# Patient Record
Sex: Female | Born: 1960 | Race: White | Hispanic: No | Marital: Married | Smoking: Current every day smoker
Health system: Southern US, Community
[De-identification: ages and names within clinical notes are randomized; demographics above are authoritative.]

## PROBLEM LIST (undated history)

## (undated) DIAGNOSIS — H409 Unspecified glaucoma: Secondary | ICD-10-CM

## (undated) DIAGNOSIS — F329 Major depressive disorder, single episode, unspecified: Secondary | ICD-10-CM

## (undated) DIAGNOSIS — F32A Depression, unspecified: Secondary | ICD-10-CM

## (undated) DIAGNOSIS — G4730 Sleep apnea, unspecified: Secondary | ICD-10-CM

## (undated) DIAGNOSIS — I10 Essential (primary) hypertension: Secondary | ICD-10-CM

## (undated) DIAGNOSIS — J939 Pneumothorax, unspecified: Secondary | ICD-10-CM

## (undated) DIAGNOSIS — J45909 Unspecified asthma, uncomplicated: Secondary | ICD-10-CM

## (undated) DIAGNOSIS — Z862 Personal history of diseases of the blood and blood-forming organs and certain disorders involving the immune mechanism: Secondary | ICD-10-CM

## (undated) DIAGNOSIS — G4700 Insomnia, unspecified: Secondary | ICD-10-CM

## (undated) DIAGNOSIS — G629 Polyneuropathy, unspecified: Secondary | ICD-10-CM

## (undated) HISTORY — PX: KNEE SURGERY: SHX244

## (undated) HISTORY — DX: Essential (primary) hypertension: I10

## (undated) HISTORY — DX: Unspecified glaucoma: H40.9

## (undated) HISTORY — DX: Polyneuropathy, unspecified: G62.9

## (undated) HISTORY — DX: Sleep apnea, unspecified: G47.30

## (undated) HISTORY — PX: TONSILLECTOMY: SUR1361

## (undated) HISTORY — DX: Unspecified asthma, uncomplicated: J45.909

## (undated) HISTORY — DX: Pneumothorax, unspecified: J93.9

## (undated) HISTORY — DX: Insomnia, unspecified: G47.00

## (undated) HISTORY — PX: TUBAL LIGATION: SHX77

## (undated) HISTORY — DX: Depression, unspecified: F32.A

## (undated) HISTORY — DX: Major depressive disorder, single episode, unspecified: F32.9

## (undated) NOTE — *Deleted (*Deleted)
   02/14/20 1414  Vitals  Temp 98.2 F (36.8 C)  Temp Source Oral  BP (!) 93/52  MAP (mmHg) (!) 62  BP Location Right Arm  BP Method Automatic  Patient Position (if appropriate) Lying  Pulse Rate 79  Resp 18  MEWS COLOR  MEWS Score Color Green  Oxygen Therapy  SpO2 97 %  MEWS Score  MEWS Temp 0  MEWS Systolic 1  MEWS Pulse 0  MEWS RR 0  MEWS LOC 0  MEWS Score 1

---

## 2004-04-03 HISTORY — PX: OTHER SURGICAL HISTORY: SHX169

## 2005-04-03 HISTORY — PX: ABDOMINOPLASTY: SUR9

## 2009-01-08 ENCOUNTER — Ambulatory Visit: Payer: Self-pay | Admitting: Family Medicine

## 2009-01-08 ENCOUNTER — Encounter (INDEPENDENT_AMBULATORY_CARE_PROVIDER_SITE_OTHER): Payer: Self-pay | Admitting: *Deleted

## 2009-01-08 DIAGNOSIS — R32 Unspecified urinary incontinence: Secondary | ICD-10-CM

## 2009-01-08 DIAGNOSIS — F329 Major depressive disorder, single episode, unspecified: Secondary | ICD-10-CM | POA: Insufficient documentation

## 2009-01-08 DIAGNOSIS — F32A Depression, unspecified: Secondary | ICD-10-CM | POA: Insufficient documentation

## 2009-01-11 ENCOUNTER — Ambulatory Visit: Payer: Self-pay | Admitting: Family Medicine

## 2009-01-12 ENCOUNTER — Encounter: Payer: Self-pay | Admitting: Family Medicine

## 2009-01-12 LAB — CONVERTED CEMR LAB
ALT: 19 units/L (ref 0–35)
Albumin: 3.9 g/dL (ref 3.5–5.2)
Alkaline Phosphatase: 53 units/L (ref 39–117)
Basophils Relative: 0.2 % (ref 0.0–3.0)
Bilirubin, Direct: 0 mg/dL (ref 0.0–0.3)
CO2: 28 meq/L (ref 19–32)
Chloride: 101 meq/L (ref 96–112)
Creatinine, Ser: 0.7 mg/dL (ref 0.4–1.2)
Glucose, Bld: 94 mg/dL (ref 70–99)
HCT: 36.9 % (ref 36.0–46.0)
Hemoglobin: 12.3 g/dL (ref 12.0–15.0)
Hgb A1c MFr Bld: 5.6 % (ref 4.6–6.5)
Lymphocytes Relative: 22.2 % (ref 12.0–46.0)
Monocytes Relative: 6.6 % (ref 3.0–12.0)
Neutro Abs: 5.8 10*3/uL (ref 1.4–7.7)
RBC: 4.04 M/uL (ref 3.87–5.11)
TSH: 2.28 microintl units/mL (ref 0.35–5.50)
Total CHOL/HDL Ratio: 4
Total Protein: 6.5 g/dL (ref 6.0–8.3)
Triglycerides: 96 mg/dL (ref 0.0–149.0)

## 2009-05-04 ENCOUNTER — Ambulatory Visit: Payer: Self-pay | Admitting: Family Medicine

## 2009-05-18 ENCOUNTER — Telehealth: Payer: Self-pay | Admitting: Family Medicine

## 2009-05-19 ENCOUNTER — Telehealth (INDEPENDENT_AMBULATORY_CARE_PROVIDER_SITE_OTHER): Payer: Self-pay | Admitting: *Deleted

## 2009-07-06 ENCOUNTER — Telehealth: Payer: Self-pay | Admitting: Family Medicine

## 2009-07-07 ENCOUNTER — Ambulatory Visit: Payer: Self-pay | Admitting: Family Medicine

## 2009-07-08 ENCOUNTER — Encounter: Payer: Self-pay | Admitting: Family Medicine

## 2009-07-09 ENCOUNTER — Telehealth: Payer: Self-pay | Admitting: Family Medicine

## 2009-07-12 ENCOUNTER — Ambulatory Visit: Payer: Self-pay | Admitting: Family Medicine

## 2009-08-07 ENCOUNTER — Telehealth (INDEPENDENT_AMBULATORY_CARE_PROVIDER_SITE_OTHER): Payer: Self-pay | Admitting: *Deleted

## 2009-08-13 ENCOUNTER — Encounter (INDEPENDENT_AMBULATORY_CARE_PROVIDER_SITE_OTHER): Payer: Self-pay | Admitting: *Deleted

## 2009-09-01 ENCOUNTER — Ambulatory Visit: Payer: Self-pay | Admitting: Family Medicine

## 2009-09-02 LAB — CONVERTED CEMR LAB
Albumin: 4 g/dL (ref 3.5–5.2)
Alkaline Phosphatase: 58 units/L (ref 39–117)
Bilirubin, Direct: 0.1 mg/dL (ref 0.0–0.3)
Calcium: 8.5 mg/dL (ref 8.4–10.5)
GFR calc non Af Amer: 124.79 mL/min (ref >60)
HDL: 42.9 mg/dL (ref >39.00)
LDL Cholesterol: 84 mg/dL (ref 0–99)
Sodium: 140 meq/L (ref 135–145)
Total Bilirubin: 0.7 mg/dL (ref 0.3–1.2)
Total CHOL/HDL Ratio: 4
VLDL: 31.8 mg/dL (ref 0.0–40.0)

## 2009-09-14 ENCOUNTER — Telehealth: Payer: Self-pay | Admitting: Family Medicine

## 2009-10-20 ENCOUNTER — Encounter: Payer: Self-pay | Admitting: Family Medicine

## 2009-11-05 ENCOUNTER — Telehealth (INDEPENDENT_AMBULATORY_CARE_PROVIDER_SITE_OTHER): Payer: Self-pay | Admitting: *Deleted

## 2009-11-08 ENCOUNTER — Ambulatory Visit: Payer: Self-pay | Admitting: Family Medicine

## 2009-11-08 DIAGNOSIS — R1310 Dysphagia, unspecified: Secondary | ICD-10-CM | POA: Insufficient documentation

## 2009-11-08 DIAGNOSIS — G4700 Insomnia, unspecified: Secondary | ICD-10-CM | POA: Insufficient documentation

## 2009-11-09 ENCOUNTER — Encounter (INDEPENDENT_AMBULATORY_CARE_PROVIDER_SITE_OTHER): Payer: Self-pay | Admitting: *Deleted

## 2009-12-03 ENCOUNTER — Ambulatory Visit: Payer: Self-pay | Admitting: Gastroenterology

## 2009-12-03 DIAGNOSIS — Z87448 Personal history of other diseases of urinary system: Secondary | ICD-10-CM | POA: Insufficient documentation

## 2009-12-03 DIAGNOSIS — K769 Liver disease, unspecified: Secondary | ICD-10-CM

## 2009-12-09 ENCOUNTER — Ambulatory Visit (HOSPITAL_COMMUNITY)
Admission: RE | Admit: 2009-12-09 | Discharge: 2009-12-09 | Payer: Self-pay | Source: Home / Self Care | Admitting: Gastroenterology

## 2009-12-14 ENCOUNTER — Ambulatory Visit: Payer: Self-pay | Admitting: Family Medicine

## 2009-12-14 DIAGNOSIS — I10 Essential (primary) hypertension: Secondary | ICD-10-CM | POA: Insufficient documentation

## 2009-12-21 LAB — CONVERTED CEMR LAB
BUN: 13 mg/dL (ref 6–23)
Chloride: 102 meq/L (ref 96–112)
Glucose, Bld: 94 mg/dL (ref 70–99)
Potassium: 5.6 meq/L — ABNORMAL HIGH (ref 3.5–5.1)

## 2009-12-28 ENCOUNTER — Ambulatory Visit: Payer: Self-pay | Admitting: Family Medicine

## 2009-12-28 DIAGNOSIS — M2550 Pain in unspecified joint: Secondary | ICD-10-CM

## 2009-12-28 DIAGNOSIS — IMO0001 Reserved for inherently not codable concepts without codable children: Secondary | ICD-10-CM

## 2009-12-31 ENCOUNTER — Telehealth: Payer: Self-pay | Admitting: Family Medicine

## 2010-01-03 LAB — CONVERTED CEMR LAB
Albumin: 4.7 g/dL (ref 3.5–5.2)
Basophils Absolute: 0.1 10*3/uL (ref 0.0–0.1)
CO2: 31 meq/L (ref 19–32)
CRP, High Sensitivity: 6.14 — ABNORMAL HIGH (ref 0.00–5.00)
Calcium: 9.3 mg/dL (ref 8.4–10.5)
Eosinophils Absolute: 0.3 10*3/uL (ref 0.0–0.7)
Glucose, Bld: 126 mg/dL — ABNORMAL HIGH (ref 70–99)
Hemoglobin: 13.4 g/dL (ref 12.0–15.0)
Lymphocytes Relative: 26.7 % (ref 12.0–46.0)
Lymphs Abs: 2.6 10*3/uL (ref 0.7–4.0)
MCHC: 34.6 g/dL (ref 30.0–36.0)
Monocytes Absolute: 0.7 10*3/uL (ref 0.1–1.0)
Neutro Abs: 6.1 10*3/uL (ref 1.4–7.7)
RDW: 11.5 % (ref 11.5–14.6)
Rhuematoid fact SerPl-aCnc: 22 intl units/mL — ABNORMAL HIGH (ref 0–20)
Sed Rate: 24 mm/hr — ABNORMAL HIGH (ref 0–22)
Sodium: 139 meq/L (ref 135–145)
TSH: 2.41 microintl units/mL (ref 0.35–5.50)

## 2010-01-28 ENCOUNTER — Telehealth (INDEPENDENT_AMBULATORY_CARE_PROVIDER_SITE_OTHER): Payer: Self-pay | Admitting: *Deleted

## 2010-02-09 ENCOUNTER — Telehealth: Payer: Self-pay | Admitting: Family Medicine

## 2010-02-14 ENCOUNTER — Ambulatory Visit: Payer: Self-pay | Admitting: Family Medicine

## 2010-03-10 ENCOUNTER — Encounter: Payer: Self-pay | Admitting: Family Medicine

## 2010-03-10 ENCOUNTER — Ambulatory Visit: Payer: Self-pay | Admitting: Family Medicine

## 2010-03-10 DIAGNOSIS — N951 Menopausal and female climacteric states: Secondary | ICD-10-CM | POA: Insufficient documentation

## 2010-03-17 ENCOUNTER — Encounter: Payer: Self-pay | Admitting: Family Medicine

## 2010-03-25 ENCOUNTER — Ambulatory Visit: Payer: Self-pay | Admitting: Family Medicine

## 2010-03-25 ENCOUNTER — Encounter: Payer: Self-pay | Admitting: Family Medicine

## 2010-03-29 ENCOUNTER — Telehealth (INDEPENDENT_AMBULATORY_CARE_PROVIDER_SITE_OTHER): Payer: Self-pay | Admitting: *Deleted

## 2010-03-29 LAB — CONVERTED CEMR LAB
Creatinine,U: 150.5 mg/dL
GFR calc non Af Amer: 95.83 mL/min (ref >60.00)
Hgb A1c MFr Bld: 6.4 % (ref 4.6–6.5)
LDL Cholesterol: 46 mg/dL (ref 0–99)
Microalb Creat Ratio: 0.4 mg/g (ref 0.0–30.0)
Potassium: 4.4 meq/L (ref 3.5–5.1)
Sodium: 138 meq/L (ref 135–145)
VLDL: 18.6 mg/dL (ref 0.0–40.0)

## 2010-05-03 NOTE — Progress Notes (Signed)
Summary: Pristiq not a preferred med  Phone Note Refill Request Message from:  Pharmacy on [facility] [address] Fax #: [phone]  Refills Requested: Medication #1:  PRISTIQ 50 MG XR24H-TAB 1 by mouth once daily   Dosage confirmed as above?Dosage Confirmed   Supply Requested: 1 month   Last Refilled: 05/18/2009 Prior Auth [id number]  Initial call taken by: Harold Barban,  May 19, 2009 8:39 AM  Follow-up for Phone Call        Med is not on pt Preferred Med, per [facility] prefererred alternatives are Bupropion XL or bupropion SR, or Cymbalta--Please advise .Kandice Hams  May 19, 2009 12:08 PM  Follow-up by: Kandice Hams,  May 19, 2009 12:08 PM  Additional Follow-up for Phone Call Additional follow up Details #1::        let pt know and make sure she has never been on one of them before----- if no---give samples of cymbalta 30 mg for 1 week then cymbalta 60 mg for 1 week------   send 60 mg 1 by mouth once daily to pharmacy---f/u 4-6 weeks Additional Follow-up by: Loreen Freud DO,  May 19, 2009 1:32 PM    Additional Follow-up for Phone Call Additional follow up Details #2::    pt has never been on Cymbalta before, advised of Dr Laury Axon recommendations and samples up front and rx [facility] [address] Follow-up by: Kandice Hams,  May 19, 2009 4:05 PM  New/Updated Medications: CYMBALTA 60 MG CPEP (DULOXETINE HCL) 1 by mouth once daily Prescriptions: CYMBALTA 60 MG CPEP (DULOXETINE HCL) 1 by mouth once daily  #30 x 1   Entered by:   Kandice Hams   Authorized by:   Loreen Freud DO   Signed by:   Kandice Hams on 05/19/2009   Method used:   Faxed to ...       [facility]  [address] # [id number]* (retail)       [address]       [place], [place]  [address]       Ph: [phone] or [phone]       Fax: [phone]   RxID:   [id number]

## 2010-05-03 NOTE — Progress Notes (Signed)
Summary: Request for pain meds  Phone Note Call from Patient   Caller: Patient Call For: Loreen Freud DO Details for Reason: Pt wants pain meds Summary of Call: Call from Pt requesting an Rx of Tramadol be [facility]ed [facility] [place], says she will be back in town next week. Their number is  [phone] her cb# [phone], Per pt she is having neck pain again.... Please advise. Initial call taken by: Almeta Monas CMA Duncan Dull),  February 09, 2010 4:21 PM  Follow-up for Phone Call        refill tramadol x1 Follow-up by: Loreen Freud DO,  February 09, 2010 9:41 PM  Additional Follow-up for Phone Call Additional follow up Details #1::        spk with pt and she Now wants a rf on her flexeril also, says she doesn't know what wrong, adv she needs and appt, scheduled appt for Monday with Dr.Lowne at 2 pm. Pt would like the RX faxed to [facility] [phone] is their #. Please advise if it is ok to fill the flexeril. Additional Follow-up by: Almeta Monas CMA Duncan Dull),  February 10, 2010 8:48 AM    Additional Follow-up for Phone Call Additional follow up Details #2::    refill flexeril x1--but if pain gets worse she should not wait until monday---she should go to UC or ER Follow-up by: Loreen Freud DO,  February 10, 2010 10:09 AM  Additional Follow-up for Phone Call Additional follow up Details #3:: Details for Additional Follow-up Action Taken: Pt aware of the above... Rx faxed to pharm [phone].... Almeta Monas CMA Duncan Dull)  February 10, 2010 11:16 AM   Prescriptions: TRAMADOL HCL 50 MG TABS (TRAMADOL HCL) take one tablet every 6 hours as needed  #30 Tablet x 0   Entered by:   Almeta Monas CMA (AAMA)   Authorized by:   Loreen Freud DO   Signed by:   Almeta Monas CMA (AAMA) on 02/10/2010   Method used:   Printed then [facility]ed [facility] ...       [facility]             , [place]         Ph:        Fax:    RxID:   [id number] FLEXERIL 10 MG TABS (CYCLOBENZAPRINE  HCL) 1 by mouth three times a day as needed  #30 Tablet x 0   Entered by:   Almeta Monas CMA (AAMA)   Authorized by:   Loreen Freud DO   Signed by:   Almeta Monas CMA (AAMA) on 02/10/2010   Method used:   Printed then [facility]ed [facility] ...       [facility]             , [place]         Ph:        Fax:    RxID:   [id number]

## 2010-05-03 NOTE — Assessment & Plan Note (Signed)
Summary: back pain - neck pain/cbs   Vital Signs:  Patient profile:   50 year old female Height:      66 inches Weight:      219.2 pounds Temp:     98.1 degrees F oral Pulse rate:   84 / minute Pulse rhythm:   regular BP sitting:   142 / 94  (left arm)  Vitals Entered By: Almeta Monas CMA Duncan Dull) (December 14, 2009 11:30 AM) CC: c/o back pain that is radiating to the neck and head causing headaches x3wks   History of Present Illness: Pt here c/o neck pain and headaches for about 1 week.  Pt is going to chiropracter and getting relief.  Current Medications (verified): 1)  Metformin Hcl 500 Mg Tabs (Metformin Hcl) .Marland Kitchen.. 1 By Mouth Daily. 2)  Estradiol 0.5 Mg Tabs (Estradiol) .Marland Kitchen.. 1 By Mouth Qd 3)  Cymbalta 60 Mg Cpep (Duloxetine Hcl) .... Take 1 Tab Once Daily 4)  Alprazolam 0.25 Mg Tabs (Alprazolam) .Marland Kitchen.. 1 By Mouth Three Times A Day 5)  Epipen 0.3 Mg/0.81ml Devi (Epinephrine) .... As Directed. 6)  Vesicare 10 Mg Tabs (Solifenacin Succinate) .Marland Kitchen.. 1 By Mouth Once Daily 7)  Multivitamins   Tabs (Multiple Vitamin) .... One Tablet By Mouth Once Daily 8)  Tums 500 Mg Chew (Calcium Carbonate Antacid) .... Six Tablet By Mouth Once Daily 9)  Lisinopril-Hydrochlorothiazide 10-12.5 Mg Tabs (Lisinopril-Hydrochlorothiazide) .Marland Kitchen.. 1 By Mouth Once Daily 10)  Flexeril 10 Mg Tabs (Cyclobenzaprine Hcl) .Marland Kitchen.. 1 By Mouth Three Times A Day As Needed 11)  Tramadol Hcl 50 Mg Tabs (Tramadol Hcl) .... Take One Tablet Every 6 Hours As Needed  Allergies (verified): No Known Drug Allergies  Past History:  Past medical, surgical, family and social histories (including risk factors) reviewed for relevance to current acute and chronic problems.  Past Medical History: Reviewed history from 12/03/2009 and no changes required. COUGH (ICD-786.2) INSOMNIA (ICD-780.52) DYSPHAGIA UNSPECIFIED (ICD-787.20) ACUTE PHARYNGITIS (ICD-462) PREVENTIVE HEALTH CARE (ICD-V70.0) FAMILY HISTORY DIABETES 1ST DEGREE RELATIVE  (ICD-V18.0) URINARY INCONTINENCE (ICD-788.30) DIABETES MELLITUS, TYPE I (ICD-250.01) DEPRESSION (ICD-311)  High Blood Pressure Readings  Past Surgical History: Reviewed history from 12/03/2009 and no changes required. Tubal ligation Knee Surgery--Left  [place]-2007  Mini Gastric Bypass Surgery   Family History: Reviewed history from 12/03/2009 and no changes required. Family History of Arthritis Family History Diabetes 1st degree relative: Dad, and Paternal Dad Side Family History High cholesterol Family History Hypertension Stroke  Family History of Stroke M 1st degree relative <50 No FH of Colon Cancer: Family History of Heart Disease: Pateranl Side   Social History: Reviewed history from 12/03/2009 and no changes required. Retired Married No Childern Patient is a former smoker.  Alcohol Use - no Daily Caffeine Use: 3 daily  Illicit Drug Use - no  Review of Systems      See HPI  Physical Exam  General:  Well-developed,well-nourished,in no acute distress; alert,appropriate and cooperative throughout examination Ears:  External ear exam shows no significant lesions or deformities.  Otoscopic examination reveals clear canals, tympanic membranes are intact bilaterally without bulging, retraction, inflammation or discharge. Hearing is grossly normal bilaterally. Nose:  External nasal examination shows no deformity or inflammation. Nasal mucosa are pink and moist without lesions or exudates. Neck:  No deformities, masses, or tenderness noted. Lungs:  Normal respiratory effort, chest expands symmetrically. Lungs are clear to auscultation, no crackles or wheezes. Msk:  muscle spasm trap b/l  Extremities:  No clubbing, cyanosis, edema, or deformity noted with  normal full range of motion of all joints.   Neurologic:  No cranial nerve deficits noted. Station and gait are normal. Plantar reflexes are down-going bilaterally. DTRs are symmetrical throughout. Sensory, motor and  coordinative functions appear intact. Skin:  Intact without suspicious lesions or rashes Cervical Nodes:  No lymphadenopathy noted Psych:  Cognition and judgment appear intact. Alert and cooperative with normal attention span and concentration. No apparent delusions, illusions, hallucinations   Impression & Recommendations:  Problem # 1:  CERVICAL MUSCLE STRAIN (ICD-847.0)  Her updated medication list for this problem includes:    Flexeril 10 Mg Tabs (Cyclobenzaprine hcl) .Marland Kitchen... 1 by mouth three times a day as needed    Tramadol Hcl 50 Mg Tabs (Tramadol hcl) .Marland Kitchen... Take one tablet every 6 hours as needed  Orders: Venipuncture (54098) TLB-BMP (Basic Metabolic Panel-BMET) (80048-METABOL)  Problem # 2:  ESSENTIAL HYPERTENSION (ICD-401.9)  Her updated medication list for this problem includes:    Lisinopril-hydrochlorothiazide 10-12.5 Mg Tabs (Lisinopril-hydrochlorothiazide) .Marland Kitchen... 1 by mouth once daily  Orders: Venipuncture (11914) TLB-BMP (Basic Metabolic Panel-BMET) (80048-METABOL)  BP today: 142/94 Prior BP: 142/86 (12/03/2009)  Labs Reviewed: K+: 4.9 (09/01/2009) Creat: : 0.6 (09/01/2009)   Chol: 159 (09/01/2009)   HDL: 42.90 (09/01/2009)   LDL: 84 (09/01/2009)   TG: 159.0 (09/01/2009)  Complete Medication List: 1)  Metformin Hcl 500 Mg Tabs (Metformin hcl) .Marland Kitchen.. 1 by mouth daily. 2)  Estradiol 0.5 Mg Tabs (Estradiol) .Marland Kitchen.. 1 by mouth qd 3)  Cymbalta 60 Mg Cpep (Duloxetine hcl) .... Take 1 tab once daily 4)  Alprazolam 0.25 Mg Tabs (Alprazolam) .Marland Kitchen.. 1 by mouth three times a day 5)  Epipen 0.3 Mg/0.43ml Devi (Epinephrine) .... As directed. 6)  Vesicare 10 Mg Tabs (Solifenacin succinate) .Marland Kitchen.. 1 by mouth once daily 7)  Multivitamins Tabs (Multiple vitamin) .... One tablet by mouth once daily 8)  Tums 500 Mg Chew (Calcium carbonate antacid) .... Six tablet by mouth once daily 9)  Lisinopril-hydrochlorothiazide 10-12.5 Mg Tabs (Lisinopril-hydrochlorothiazide) .Marland Kitchen.. 1 by mouth once  daily 10)  Flexeril 10 Mg Tabs (Cyclobenzaprine hcl) .Marland Kitchen.. 1 by mouth three times a day as needed 11)  Tramadol Hcl 50 Mg Tabs (Tramadol hcl) .... Take one tablet every 6 hours as needed Prescriptions: TRAMADOL HCL 50 MG TABS (TRAMADOL HCL) take one tablet every 6 hours as needed  #30 x 0   Entered by:   Doristine Devoid CMA   Authorized by:   Loreen Freud DO   Signed by:   Doristine Devoid CMA on 12/14/2009   Method used:   Electronically to        [facility] # [id number]* (retail)       [address]       [place], [place]  [address]       Ph: [phone] or [phone]       Fax: [phone]   RxID:   [id number] FLEXERIL 10 MG TABS (CYCLOBENZAPRINE HCL) 1 by mouth three times a day as needed  #30 x 0   Entered and Authorized by:   Loreen Freud DO   Signed by:   Loreen Freud DO on 12/14/2009   Method used:   Electronically to        [facility] # [id number]* (retail)       [address]       [place], [place]  [address]  Ph: [phone] or [phone]       Fax: [phone]   RxID:   [id number] LISINOPRIL-HYDROCHLOROTHIAZIDE 10-12.5 MG TABS (LISINOPRIL-HYDROCHLOROTHIAZIDE) 1 by mouth once daily  #30 x 2   Entered and Authorized by:   Loreen Freud DO   Signed by:   Loreen Freud DO on 12/14/2009   Method used:   Electronically to        [facility] # [id number]* (retail)       [address]       [place], [place]  [address]       Ph: [phone] or [phone]       Fax: [phone]   RxID:   [id number]

## 2010-05-03 NOTE — Progress Notes (Signed)
Summary: refill  Phone Note Refill Request Call back at Work Phone  Call back at [phone] Message from:  Patient  Refills Requested: Medication #1:  METFORMIN HCL 500 MG TABS 1 by mouth daily. patient wants refill at new pharmacy - [facility] [address] [phone]  Initial call taken by: Okey Regal [place],  January 28, 2010 9:30 AM    Prescriptions: METFORMIN HCL 500 MG TABS (METFORMIN HCL) 1 by mouth daily.  #30 Tablet x 2   Entered by:   Almeta Monas CMA (AAMA)   Authorized by:   Loreen Freud DO   Signed by:   Almeta Monas CMA (AAMA) on 01/28/2010   Method used:   Printed then faxed to ...       [facility]  [address] # [id number]* (retail)       [address]       [place], [place]  [address]       Ph: [phone] or [phone]       Fax: [phone]   RxID:   [id number]  Rx printed and faxed to [facility] in [place] (New Location) Fax # [phone].Almeta Monas CMA Duncan Dull)  January 28, 2010 10:49 AM

## 2010-05-03 NOTE — Letter (Signed)
Summary: Primary Care Consult Scheduled Letter  [facility] [place]  [address] [place], [place] [address]   Phone: [phone]  Fax: [phone]      11/09/2009 MRN: [id number]  [facility] [address] [place], [place]  [address]    Dear Ms. Amber Mata,    We have scheduled an appointment for you.  At the recommendation of Dr. Loreen Freud, we have scheduled you a consult with Dr. Sheryn Bison of [facility] on 12-03-2009 at 9:30am.  Their address is [address], [address]rd floor, [place] [address]. The office phone number is [phone].  If this appointment day and time is not convenient for you, please feel free to call the office of the doctor you are being referred to at the number listed above and reschedule the appointment.    It is important for you to keep your scheduled appointments. We are here to make sure you are given good patient care.   Thank you,    Renee, Patient Care Coordinator [facility]

## 2010-05-03 NOTE — Progress Notes (Signed)
Summary: Cholesterol Med  Phone Note Call from Patient   Caller: Patient Summary of Call: Patient would like to notify the doctor that she has chosen not to take her high cholestorol medicine. Initial call taken by: Harold Barban,  September 14, 2009 2:58 PM

## 2010-05-03 NOTE — Assessment & Plan Note (Signed)
Summary: sore throat/kdc   Vital Signs:  Patient profile:   50 year old female Height:      66 inches Weight:      214.25 pounds BMI:     34.71 Temp:     98.4 degrees F oral Pulse rate:   74 / minute BP sitting:   120 / 80  Vitals Entered By: Kandice Hams (July 07, 2009 11:16 AM) CC: c/o sore throat can hardly swallow, allittle cough, URI symptoms   History of Present Illness:       This is a 50 year old woman who presents with URI symptoms.  The symptoms began 2 days ago.  The patient complains of sore throat and dry cough, but denies nasal congestion, clear nasal discharge, purulent nasal discharge, productive cough, earache, and sick contacts.  The patient denies fever, low-grade fever (<100.5 degrees), fever of 100.5-103 degrees, fever of 103.1-104 degrees, fever to >104 degrees, stiff neck, dyspnea, wheezing, rash, vomiting, diarrhea, use of an antipyretic, and response to antipyretic.  The patient denies itchy watery eyes, itchy throat, sneezing, seasonal symptoms, response to antihistamine, headache, muscle aches, and severe fatigue.  The patient denies the following risk factors for Strep sinusitis: unilateral facial pain, unilateral nasal discharge, poor response to decongestant, double sickening, tooth pain, Strep exposure, tender adenopathy, and absence of cough.    Current Medications (verified): 1)  Metformin Hcl 500 Mg Tabs (Metformin Hcl) .Marland Kitchen.. 1 By Mouth Daily. 2)  Estradiol 0.5 Mg Tabs (Estradiol) .Marland Kitchen.. 1 By Mouth Qd 3)  Cymbalta 60 Mg Cpep (Duloxetine Hcl) .Marland Kitchen.. 1 By Mouth Once Daily 4)  Alprazolam 0.25 Mg Tabs (Alprazolam) .Marland Kitchen.. 1 By Mouth Three Times A Day 5)  Epipen 0.3 Mg/0.22ml Devi (Epinephrine) .... As Directed. 6)  Vesicare 10 Mg Tabs (Solifenacin Succinate) .Marland Kitchen.. 1 By Mouth Once Daily 7)  Prednisone 10 Mg Tabs (Prednisone) .... 3 By Mouth Once Daily For 3 Days Then 2 By Mouth Once Daily For 3 Days Then 1 By Mouth Once Daily For 3 Days  Allergies (verified): No  Known Drug Allergies  Past History:  Past medical, surgical, family and social histories (including risk factors) reviewed for relevance to current acute and chronic problems.  Past Medical History: Reviewed history from 01/08/2009 and no changes required. Depression Diabetes mellitus, type I Urinary incontinence High Blood Pressure Readings UTI   Past Surgical History: Reviewed history from 01/08/2009 and no changes required. Tubal ligation Knee Surgery [place]-2007   Family History: Reviewed history from 01/08/2009 and no changes required. Family History of Arthritis Family History Diabetes 1st degree relative Family History High cholesterol Family History Hypertension Stroke  Family History of Stroke M 1st degree relative <50  Social History: Reviewed history from 01/08/2009 and no changes required. Married Never Smoked Alcohol use-yes  Review of Systems      See HPI  Physical Exam  General:  Well-developed,well-nourished,in no acute distress; alert,appropriate and cooperative throughout examination Mouth:  uvula swollen and red with white patch pharyngeal erythema.  pharyngeal erythema.   Neck:  No deformities, masses, or tenderness noted. Lungs:  Normal respiratory effort, chest expands symmetrically. Lungs are clear to auscultation, no crackles or wheezes. Heart:  normal rate and no murmur.  normal rate and no murmur.   Skin:  Intact without suspicious lesions or rashes Cervical Nodes:  No lymphadenopathy noted Psych:  Cognition and judgment appear intact. Alert and cooperative with normal attention span and concentration. No apparent delusions, illusions, hallucinations   Impression &  Recommendations:  Problem # 1:  ACUTE PHARYNGITIS (ICD-462) prednisone taper rto if no better Orders: T-Culture, Throat ([address]-60454) Admin of Therapeutic Inj  intramuscular or subcutaneous (09811) Bicillin LA 1.2 million units Injection (B1478) Depo- Medrol 80mg   (J1040) Admin of Therapeutic Inj  intramuscular or subcutaneous (29562) Rapid Strep (13086)  Instructed to complete antibiotics and call if not improved in 48 hours.   Complete Medication List: 1)  Metformin Hcl 500 Mg Tabs (Metformin hcl) .Marland Kitchen.. 1 by mouth daily. 2)  Estradiol 0.5 Mg Tabs (Estradiol) .Marland Kitchen.. 1 by mouth qd 3)  Cymbalta 60 Mg Cpep (Duloxetine hcl) .Marland Kitchen.. 1 by mouth once daily 4)  Alprazolam 0.25 Mg Tabs (Alprazolam) .Marland Kitchen.. 1 by mouth three times a day 5)  Epipen 0.3 Mg/0.31ml Devi (Epinephrine) .... As directed. 6)  Vesicare 10 Mg Tabs (Solifenacin succinate) .Marland Kitchen.. 1 by mouth once daily 7)  Prednisone 10 Mg Tabs (Prednisone) .... 3 by mouth once daily for 3 days then 2 by mouth once daily for 3 days then 1 by mouth once daily for 3 days Prescriptions: PREDNISONE 10 MG TABS (PREDNISONE) 3 by mouth once daily for 3 days then 2 by mouth once daily for 3 days then 1 by mouth once daily for 3 days  #18 x 0   Entered and Authorized by:   Loreen Freud DO   Signed by:   Loreen Freud DO on 07/07/2009   Method used:   Electronically to        [facility] # [id number]* (retail)       [address]       [place], [place]  [address]       Ph: [phone] or [phone]       Fax: [phone]   RxID:   [id number]    Medication Administration  Injection # 1:    Medication: Bicillin LA 1.2 million units Injection    Diagnosis: ACUTE PHARYNGITIS (ICD-462)    Route: IM    Site: LUOQ gluteus    Exp Date: 05/05/2011    Lot #: [id number]    Mfr: [employer]    Patient tolerated injection without complications    Given by: Kandice Hams (July 07, 2009 12:07 PM)  Injection # 2:    Medication: Depo- Medrol 80mg     Diagnosis: ACUTE PHARYNGITIS (ICD-462)    Route: IM    Site: RUOQ gluteus    Exp Date: 02/11/2010    Lot #: [id number]    Mfr: upjohn    Patient tolerated injection without complications    Given by: Kandice Hams (July 07, 2009 12:11  PM)  Orders Added: 1)  T-Culture, Throat [01093-23557] 2)  Admin of Therapeutic Inj  intramuscular or subcutaneous [96372] 3)  Bicillin LA 1.2 million units Injection [J0561] 4)  Depo- Medrol 80mg  [J1040] 5)  Admin of Therapeutic Inj  intramuscular or subcutaneous [96372] 6)  Est. Patient Level III [32202] 7)  Rapid Strep [54270]

## 2010-05-03 NOTE — Assessment & Plan Note (Signed)
Summary: neck pain//kp   Vital Signs:  Patient profile:   50 year old female Weight:      221.0 pounds Pulse rate:   80 / minute Pulse rhythm:   regular BP sitting:   140 / 80  (left arm) Cuff size:   large  Vitals Entered By: Almeta Monas CMA Duncan Dull) (February 14, 2010 1:52 PM) CC: c/o neck pain-- not getting better after seeing chiropractor   History of Present Illness: Pt here still c/o L sided neck stiffness and pain.  She has been seeing chiropractor and taking ultram and flexeril with no relief.    Current Medications (verified): 1)  Metformin Hcl 500 Mg Tabs (Metformin Hcl) .Marland Kitchen.. 1 By Mouth Daily. 2)  Estradiol 0.5 Mg Tabs (Estradiol) .Marland Kitchen.. 1 By Mouth Qd 3)  Cymbalta 60 Mg Cpep (Duloxetine Hcl) .... 2 By Mouth Once Daily 4)  Alprazolam 0.25 Mg Tabs (Alprazolam) .Marland Kitchen.. 1 By Mouth Three Times A Day 5)  Epipen 0.3 Mg/0.63ml Devi (Epinephrine) .... As Directed. 6)  Multivitamins   Tabs (Multiple Vitamin) .... One Tablet By Mouth Once Daily 7)  Tums 500 Mg Chew (Calcium Carbonate Antacid) .... Six Tablet By Mouth Once Daily 8)  Lisinopril-Hydrochlorothiazide 20-25 Mg Tabs (Lisinopril-Hydrochlorothiazide) .Marland Kitchen.. 1 By Mouth Once Daily 9)  Soma 350 Mg Tabs (Carisoprodol) .Marland Kitchen.. 1 By Mouth Qid 10)  Tramadol Hcl 50 Mg Tabs (Tramadol Hcl) .Marland Kitchen.. 1-2 By Mouth Every 6 Hours As Needed 11)  Lipitor 20 Mg Tabs (Atorvastatin Calcium) .Marland Kitchen.. 1 By Mouth Once Daily 12)  Prednisone 10 Mg Tabs (Prednisone) .... 3 By Mouth Once Daily For 3 Days Then 2 By Mouth Once Daily For 3 Days Then 1 By Mouth Once Daily For 3 Days  Allergies (verified): No Known Drug Allergies  Past History:  Past Medical History: Last updated: 12/03/2009 COUGH (ICD-786.2) INSOMNIA (ICD-780.52) DYSPHAGIA UNSPECIFIED (ICD-787.20) ACUTE PHARYNGITIS (ICD-462) PREVENTIVE HEALTH CARE (ICD-V70.0) FAMILY HISTORY DIABETES 1ST DEGREE RELATIVE (ICD-V18.0) URINARY INCONTINENCE (ICD-788.30) DIABETES MELLITUS, TYPE I  (ICD-250.01) DEPRESSION (ICD-311)  High Blood Pressure Readings  Past Surgical History: Last updated: 12/03/2009 Tubal ligation Knee Surgery--Left  [place]-2007  Mini Gastric Bypass Surgery   Family History: Last updated: 12/03/2009 Family History of Arthritis Family History Diabetes 1st degree relative: Dad, and Paternal Dad Side Family History High cholesterol Family History Hypertension Stroke  Family History of Stroke M 1st degree relative <50 No FH of Colon Cancer: Family History of Heart Disease: Pateranl Side   Social History: Last updated: 12/03/2009 Retired Married No Childern Patient is a former smoker.  Alcohol Use - no Daily Caffeine Use: 3 daily  Illicit Drug Use - no  Risk Factors: Alcohol Use: <1 (01/08/2009)  Risk Factors: Smoking Status: quit (12/03/2009)  Family History: Reviewed history from 12/03/2009 and no changes required. Family History of Arthritis Family History Diabetes 1st degree relative: Dad, and Paternal Dad Side Family History High cholesterol Family History Hypertension Stroke  Family History of Stroke M 1st degree relative <50 No FH of Colon Cancer: Family History of Heart Disease: Pateranl Side   Social History: Reviewed history from 12/03/2009 and no changes required. Retired Married No Childern Patient is a former smoker.  Alcohol Use - no Daily Caffeine Use: 3 daily  Illicit Drug Use - no  Review of Systems      See HPI  Physical Exam  General:  Well-developed,well-nourished,in no acute distress; alert,appropriate and cooperative throughout examination Msk:  + muscle spasms L shoulder/ trap + decrease rom R shoulder Cervical  Nodes:  No lymphadenopathy noted Psych:  Cognition and judgment appear intact. Alert and cooperative with normal attention span and concentration. No apparent delusions, illusions, hallucinations   Impression & Recommendations:  Problem # 1:  CERVICAL MUSCLE STRAIN  (ICD-847.0) prednisone taper Her updated medication list for this problem includes:    Soma 350 Mg Tabs (Carisoprodol) .Marland Kitchen... 1 by mouth qid    Tramadol Hcl 50 Mg Tabs (Tramadol hcl) .Marland Kitchen... 1-2 by mouth every 6 hours as needed  Discussed exercises and use of moist heat or cold and medication.   Complete Medication List: 1)  Metformin Hcl 500 Mg Tabs (Metformin hcl) .Marland Kitchen.. 1 by mouth daily. 2)  Estradiol 0.5 Mg Tabs (Estradiol) .Marland Kitchen.. 1 by mouth qd 3)  Cymbalta 60 Mg Cpep (Duloxetine hcl) .... 2 by mouth once daily 4)  Alprazolam 0.25 Mg Tabs (Alprazolam) .Marland Kitchen.. 1 by mouth three times a day 5)  Epipen 0.3 Mg/0.77ml Devi (Epinephrine) .... As directed. 6)  Multivitamins Tabs (Multiple vitamin) .... One tablet by mouth once daily 7)  Tums 500 Mg Chew (Calcium carbonate antacid) .... Six tablet by mouth once daily 8)  Lisinopril-hydrochlorothiazide 20-25 Mg Tabs (Lisinopril-hydrochlorothiazide) .Marland Kitchen.. 1 by mouth once daily 9)  Soma 350 Mg Tabs (Carisoprodol) .Marland Kitchen.. 1 by mouth qid 10)  Tramadol Hcl 50 Mg Tabs (Tramadol hcl) .Marland Kitchen.. 1-2 by mouth every 6 hours as needed 11)  Lipitor 20 Mg Tabs (Atorvastatin calcium) .Marland Kitchen.. 1 by mouth once daily 12)  Prednisone 10 Mg Tabs (Prednisone) .... 3 by mouth once daily for 3 days then 2 by mouth once daily for 3 days then 1 by mouth once daily for 3 days Prescriptions: TRAMADOL HCL 50 MG TABS (TRAMADOL HCL) 1-2 by mouth every 6 hours as needed  #60 x 0   Entered and Authorized by:   Loreen Freud DO   Signed by:   Loreen Freud DO on 02/14/2010   Method used:   Electronically to        [id number] (retail)       [address]       [place], [place]  [address]       Ph: [phone] or [phone]       Fax: [phone]   RxID:   [id number] SOMA 350 MG TABS (CARISOPRODOL) 1 by mouth qid  #60 x 0   Entered and Authorized by:   Loreen Freud DO   Signed by:   Loreen Freud DO on 02/14/2010   Method used:   Electronically to         [id number] (retail)       [address]       [place], [place]  [address]       Ph: [phone] or [phone]       Fax: [phone]   RxID:   [id number] PREDNISONE 10 MG TABS (PREDNISONE) 3 by mouth once daily for 3 days then 2 by mouth once daily for 3 days then 1 by mouth once daily for 3 days  #18 x 0   Entered and Authorized by:   Loreen Freud DO   Signed by:   Loreen Freud DO on 02/14/2010   Method used:   Electronically to        [id number] (retail)       [address]  [place], [place]  [address]       Ph: [phone] or [phone]       Fax: [phone]   RxID:   [id number]    Orders Added: 1)  Est. Patient Level III [27[address]]

## 2010-05-03 NOTE — Assessment & Plan Note (Signed)
Summary: DYSPHAGIA/YF   History of Present Illness Visit Type: consult  Primary GI MD: Sheryn Bison MD FACP FAGA Primary Provider: Loreen Freud, DO  Requesting Provider: Loreen Freud, DO  Chief Complaint: Dysphagia with solids History of Present Illness:   [name] with 2 years of intermittent dysphagia for solids and liquids her upper pharyngeal area. She fell at ENT exam 2 years ago in [facility] which was normal. She denies chronic reflux symptoms or history of meat impactions. She has not had previous endoscopies or barium studies. His been no anorexia weight loss, other upper GI, hepatobiliary, or lower GI problems. She has no other neuromuscular problems but does have chronic anxiety and depression.   GI Review of Systems    Reports dysphagia with solids.      Denies abdominal pain, acid reflux, belching, bloating, chest pain, dysphagia with liquids, heartburn, loss of appetite, nausea, vomiting, vomiting blood, weight loss, and  weight gain.        Denies anal fissure, black tarry stools, change in bowel habit, constipation, diarrhea, diverticulosis, fecal incontinence, heme positive stool, hemorrhoids, irritable bowel syndrome, jaundice, light color stool, liver problems, rectal bleeding, and  rectal pain.    Current Medications (verified): 1)  Metformin Hcl 500 Mg Tabs (Metformin Hcl) .Marland Kitchen.. 1 By Mouth Daily. 2)  Estradiol 0.5 Mg Tabs (Estradiol) .Marland Kitchen.. 1 By Mouth Qd 3)  Cymbalta 60 Mg Cpep (Duloxetine Hcl) .... Take 1 Tab Once Daily 4)  Alprazolam 0.25 Mg Tabs (Alprazolam) .Marland Kitchen.. 1 By Mouth Three Times A Day 5)  Epipen 0.3 Mg/0.66ml Devi (Epinephrine) .... As Directed. 6)  Vesicare 10 Mg Tabs (Solifenacin Succinate) .Marland Kitchen.. 1 By Mouth Once Daily 7)  Multivitamins   Tabs (Multiple Vitamin) .... One Tablet By Mouth Once Daily 8)  Tums 500 Mg Chew (Calcium Carbonate Antacid) .... Six Tablet By Mouth Once Daily  Allergies (verified): No Known Drug  Allergies  Past History:  Past medical, surgical, family and social histories (including risk factors) reviewed for relevance to current acute and chronic problems.  Past Medical History: COUGH (ICD-786.2) INSOMNIA (ICD-780.52) DYSPHAGIA UNSPECIFIED (ICD-787.20) ACUTE PHARYNGITIS (ICD-462) PREVENTIVE HEALTH CARE (ICD-V70.0) FAMILY HISTORY DIABETES 1ST DEGREE RELATIVE (ICD-V18.0) URINARY INCONTINENCE (ICD-788.30) DIABETES MELLITUS, TYPE I (ICD-250.01) DEPRESSION (ICD-311)  High Blood Pressure Readings  Past Surgical History: Tubal ligation Knee Surgery--Left  [place]-2007  Mini Gastric Bypass Surgery   Family History: Reviewed history from 01/08/2009 and no changes required. Family History of Arthritis Family History Diabetes 1st degree relative: Dad, and Paternal Dad Side Family History High cholesterol Family History Hypertension Stroke  Family History of Stroke M 1st degree relative <50 No FH of Colon Cancer: Family History of Heart Disease: Pateranl Side   Social History: Reviewed history from 01/08/2009 and no changes required. Retired Married No Childern Patient is a former smoker.  Alcohol Use - no Daily Caffeine Use: 3 daily  Illicit Drug Use - no Smoking Status:  quit Drug Use:  no  Review of Systems       The patient complains of anxiety-new, cough, muscle pains/cramps, night sweats, skin rash, sleeping problems, sore throat, and voice change.  The patient denies allergy/sinus, anemia, arthritis/joint pain, back pain, blood in urine, breast changes/lumps, change in vision, confusion, coughing up blood, depression-new, fainting, fatigue, fever, headaches-new, hearing problems, heart murmur, heart rhythm changes, itching, menstrual pain, nosebleeds, pregnancy symptoms, shortness of breath, swelling of feet/legs, swollen lymph glands, thirst - excessive , urination - excessive , urination changes/pain, urine leakage,  and vision changes.    Vital  Signs:  Patient profile:   50 year old female Height:      66 inches Weight:      220 pounds BMI:     35.64 BSA:     2.08 Pulse rate:   80 / minute Pulse rhythm:   regular BP sitting:   142 / 86  (left arm) Cuff size:   regular  Vitals Entered By: Ok Anis CMA (December 03, 2009 9:40 AM)  Physical Exam  General:  Well developed, well nourished, no acute distress.healthy appearing.   Head:  Normocephalic and atraumatic. Eyes:  PERRLA, no icterus. Mouth:  No deformity or lesions, dentition normal. Neck:  Supple; no masses or thyromegaly. Lungs:  Clear throughout to auscultation. Heart:  Regular rate and rhythm; no murmurs, rubs,  or bruits. Abdomen:  Mild hepatomegaly right upper quadrant with a prominent left hepatic lobe. No other abdominal masses or tenderness noted. Pulses:  Normal pulses noted. Extremities:  No clubbing, cyanosis, edema or deformities noted. Neurologic:  Alert and  oriented x4;  grossly normal neurologically. Cervical Nodes:  No significant cervical adenopathy. Psych:  Alert and cooperative. Normal mood and affect.   Impression & Recommendations:  Problem # 1:  DYSPHAGIA UNSPECIFIED (ICD-787.20) Assessment Unchanged Rule Out an esophageal motility disorder, neck spondylosis with compression, versus occult stricture the esophagus or esophageal web. Barium swallow has been scheduled, and she probably will need endoscopy exam.  Problem # 2:  COUGH (ICD-786.2) Assessment: Unchanged Possibly an exercise to a manifestation of chronic GERD.  Problem # 3:  FATTY LIVER DISEASE (ICD-571.8) Assessment: Unchanged Mild hepatomegaly with a history of fatty liver and normal liver function tests. Patient does use minute metformin depending on her blood sugar levels.  Patient Instructions: 1)  Copy sent to : Dr. Loreen Freud 2)  Please continue current medications.  3)  Outpatient barium swallow and possible endoscopy.  Appended Document:  DYSPHAGIA/YF    Clinical Lists Changes  Orders: Added new Test order of Barium Swallow with Tablet (BS w/tab) - Signed

## 2010-05-03 NOTE — Letter (Signed)
Summary: Primary Care Appointment Letter  [facility]  [address] [place], [place] [address]   Phone: [phone]  Fax: [phone]    08/13/2009 MRN: [id number]  [facility] [address] [place], [place]  [address]  Dear Ms. Elesa Massed,   Your Primary Care Physician Loreen Freud DO has indicated that:    _______it is time to schedule an appointment.    _______you missed your appointment on______ and need to call and          reschedule.    ____X___you need to have lab work done (a1c, lipid, bmp, lft 250.00, 272.4).    _______you need to schedule an appointment discuss lab or test results.    _______you need to call to reschedule your appointment that is                       scheduled on _________.     Please call our office as soon as possible. Our phone number is [phone]. Please press option 1. Our office is open 8a-5p, Monday through Friday.     Thank you,    [facility]

## 2010-05-03 NOTE — Assessment & Plan Note (Signed)
Summary: cpx/cbs   Vital Signs:  Patient profile:   50 year old female Height:      65.5 inches Weight:      208.2 pounds Temp:     98.1 degrees F oral Pulse rate:   76 / minute Pulse rhythm:   regular BP sitting:   120 / 74  (left arm) Cuff size:   large  Vitals Entered By: Almeta Monas CMA Duncan Dull) (March 10, 2010 10:14 AM) CC: CPX/not fasting- no pap needed-- still having pain   History of Present Illness: Pt here for cpe.  No pap--pt needs gyn.    Type 1 diabetes mellitus follow-up      This is a 50 year old woman who presents with Type 2 diabetes mellitus follow-up.  The patient denies polyuria, polydipsia, blurred vision, self managed hypoglycemia, hypoglycemia requiring help, weight loss, weight gain, and numbness of extremities.  The patient denies the following symptoms: neuropathic pain, chest pain, vomiting, orthostatic symptoms, poor wound healing, intermittent claudication, vision loss, and foot ulcer.  Since the last visit the patient reports good dietary compliance, compliance with medications, exercising regularly, and not monitoring blood glucose.  Since the last visit, the patient reports having had eye care by an ophthalmologist.    Hyperlipidemia follow-up      The patient also presents for Hyperlipidemia follow-up.  The patient denies muscle aches, GI upset, abdominal pain, flushing, itching, constipation, diarrhea, and fatigue.  The patient denies the following symptoms: chest pain/pressure, exercise intolerance, dypsnea, palpitations, syncope, and pedal edema.  Compliance with medications (by patient report) has been near 100%.  Dietary compliance has been good.  The patient reports exercising 3-4X per week.  Adjunctive measures currently used by the patient include ASA.    Hypertension follow-up      The patient also presents for Hypertension follow-up.  The patient denies lightheadedness, urinary frequency, headaches, edema, impotence, rash, and fatigue.  The  patient denies the following associated symptoms: chest pain, chest pressure, exercise intolerance, dyspnea, palpitations, syncope, leg edema, and pedal edema.  Compliance with medications (by patient report) has been near 100%.  The patient reports that dietary compliance has been good.  The patient reports exercising 3-4X per week.  Adjunctive measures currently used by the patient include salt restriction.    Preventive Screening-Counseling & Management  Alcohol-Tobacco     Alcohol drinks/day: <1     Smoking Status: quit     Year Started: 1990     Year Quit: 1993  Caffeine-Diet-Exercise     Caffeine use/day: 0     Does Patient Exercise: yes     Type of exercise: treadmill,  walk     Exercise (avg: min/session): 30-60     Times/week: 4  Hep-HIV-STD-Contraception     Dental Visit-last 6 months yes     Dental Care Counseling: not indicated; dental care within six months     SBE monthly: yes     SBE Education/Counseling: not indicated; SBE done regularly      Sexual History:  currently monogamous.    Problems Prior to Update: 1)  Menopausal Syndrome  (ICD-627.2) 2)  Pain in Joint, Multiple Sites  (ICD-719.49) 3)  Myalgia  (ICD-729.1) 4)  Cervical Muscle Strain  (ICD-847.0) 5)  Essential Hypertension  (ICD-401.9) 6)  Fatty Liver Disease  (ICD-571.8) 7)  Uti's, Hx of  (ICD-V13.00) 8)  Cough  (ICD-786.2) 9)  Insomnia  (ICD-780.52) 10)  Dysphagia Unspecified  (ICD-787.20) 11)  Acute Pharyngitis  (ICD-462)  12)  Preventive Health Care  (ICD-V70.0) 13)  Family History Diabetes 1st Degree Relative  (ICD-V18.0) 14)  Urinary Incontinence  (ICD-788.30) 15)  Diabetes Mellitus, Type I  (ICD-250.01) 16)  Depression  (ICD-311)  Medications Prior to Update: 1)  Metformin Hcl 500 Mg Tabs (Metformin Hcl) .Marland Kitchen.. 1 By Mouth Daily. 2)  Estradiol 0.5 Mg Tabs (Estradiol) .Marland Kitchen.. 1 By Mouth Qd 3)  Cymbalta 60 Mg Cpep (Duloxetine Hcl) .... 2 By Mouth Once Daily 4)  Alprazolam 0.25 Mg Tabs  (Alprazolam) .Marland Kitchen.. 1 By Mouth Three Times A Day 5)  Epipen 0.3 Mg/0.16ml Devi (Epinephrine) .... As Directed. 6)  Multivitamins   Tabs (Multiple Vitamin) .... One Tablet By Mouth Once Daily 7)  Tums 500 Mg Chew (Calcium Carbonate Antacid) .... Six Tablet By Mouth Once Daily 8)  Lisinopril-Hydrochlorothiazide 20-25 Mg Tabs (Lisinopril-Hydrochlorothiazide) .Marland Kitchen.. 1 By Mouth Once Daily 9)  Soma 350 Mg Tabs (Carisoprodol) .Marland Kitchen.. 1 By Mouth Qid 10)  Tramadol Hcl 50 Mg Tabs (Tramadol Hcl) .Marland Kitchen.. 1-2 By Mouth Every 6 Hours As Needed 11)  Lipitor 20 Mg Tabs (Atorvastatin Calcium) .Marland Kitchen.. 1 By Mouth Once Daily  Current Medications (verified): 1)  Metformin Hcl 500 Mg Tabs (Metformin Hcl) .Marland Kitchen.. 1 By Mouth Daily. 2)  Estradiol 0.5 Mg Tabs (Estradiol) .Marland Kitchen.. 1 By Mouth Qd 3)  Cymbalta 60 Mg Cpep (Duloxetine Hcl) .... 2 By Mouth Once Daily 4)  Alprazolam 0.25 Mg Tabs (Alprazolam) .Marland Kitchen.. 1 By Mouth Three Times A Day 5)  Epipen 0.3 Mg/0.73ml Devi (Epinephrine) .... As Directed. 6)  Multivitamins   Tabs (Multiple Vitamin) .... One Tablet By Mouth Once Daily 7)  Tums 500 Mg Chew (Calcium Carbonate Antacid) .... Six Tablet By Mouth Once Daily 8)  Lisinopril-Hydrochlorothiazide 20-25 Mg Tabs (Lisinopril-Hydrochlorothiazide) .Marland Kitchen.. 1 By Mouth Once Daily 9)  Tramadol Hcl 50 Mg Tabs (Tramadol Hcl) .Marland Kitchen.. 1-2 By Mouth Every 6 Hours As Needed 10)  Lipitor 20 Mg Tabs (Atorvastatin Calcium) .Marland Kitchen.. 1 By Mouth Once Daily  Allergies (verified): No Known Drug Allergies  Past History:  Past Medical History: Last updated: 12/03/2009 COUGH (ICD-786.2) INSOMNIA (ICD-780.52) DYSPHAGIA UNSPECIFIED (ICD-787.20) ACUTE PHARYNGITIS (ICD-462) PREVENTIVE HEALTH CARE (ICD-V70.0) FAMILY HISTORY DIABETES 1ST DEGREE RELATIVE (ICD-V18.0) URINARY INCONTINENCE (ICD-788.30) DIABETES MELLITUS, TYPE I (ICD-250.01) DEPRESSION (ICD-311)  High Blood Pressure Readings  Past Surgical History: Last updated: 12/03/2009 Tubal ligation Knee Surgery--Left    [place]-2007  Mini Gastric Bypass Surgery   Family History: Last updated: 12/03/2009 Family History of Arthritis Family History Diabetes 1st degree relative: Dad, and Paternal Dad Side Family History High cholesterol Family History Hypertension Stroke  Family History of Stroke M 1st degree relative <50 No FH of Colon Cancer: Family History of Heart Disease: Pateranl Side   Social History: Last updated: 12/03/2009 Retired Married No Childern Patient is a former smoker.  Alcohol Use - no Daily Caffeine Use: 3 daily  Illicit Drug Use - no  Risk Factors: Alcohol Use: <1 (03/10/2010) Caffeine Use: 0 (03/10/2010) Exercise: yes (03/10/2010)  Risk Factors: Smoking Status: quit (03/10/2010)  Family History: Reviewed history from 12/03/2009 and no changes required. Family History of Arthritis Family History Diabetes 1st degree relative: Dad, and Paternal Dad Side Family History High cholesterol Family History Hypertension Stroke  Family History of Stroke M 1st degree relative <50 No FH of Colon Cancer: Family History of Heart Disease: Pateranl Side   Social History: Reviewed history from 12/03/2009 and no changes required. Retired Married No Childern Patient is a former smoker.  Alcohol Use - no  Daily Caffeine Use: 3 daily  Illicit Drug Use - no Does Patient Exercise:  yes Caffeine use/day:  0 Sexual History:  currently monogamous  Review of Systems      See HPI General:  Denies chills, fatigue, fever, loss of appetite, malaise, sleep disorder, sweats, weakness, and weight loss. Eyes:  Denies blurring, discharge, double vision, eye irritation, eye pain, halos, itching, light sensitivity, red eye, vision loss-1 eye, and vision loss-both eyes; optho-- q1y. ENT:  Denies decreased hearing, difficulty swallowing, ear discharge, earache, hoarseness, nasal congestion, nosebleeds, postnasal drainage, ringing in ears, sinus pressure, and sore throat. CV:  Denies bluish  discoloration of lips or nails, chest pain or discomfort, difficulty breathing at night, difficulty breathing while lying down, fainting, fatigue, leg cramps with exertion, lightheadness, near fainting, palpitations, shortness of breath with exertion, swelling of feet, swelling of hands, and weight gain. Resp:  Denies chest discomfort, chest pain with inspiration, cough, coughing up blood, excessive snoring, hypersomnolence, morning headaches, pleuritic, shortness of breath, sputum productive, and wheezing. GI:  Denies abdominal pain, bloody stools, change in bowel habits, constipation, dark tarry stools, diarrhea, excessive appetite, gas, hemorrhoids, indigestion, loss of appetite, nausea, vomiting, vomiting blood, and yellowish skin color. GU:  Denies abnormal vaginal bleeding, decreased libido, discharge, dysuria, genital sores, hematuria, incontinence, nocturia, urinary frequency, and urinary hesitancy. MS:  Complains of joint pain; denies joint redness, joint swelling, loss of strength, low back pain, mid back pain, muscle aches, muscle , cramps, muscle weakness, stiffness, and thoracic pain. Derm:  Denies changes in color of skin, changes in nail beds, dryness, excessive perspiration, flushing, hair loss, insect bite(s), itching, lesion(s), poor wound healing, and rash. Neuro:  Denies brief paralysis, difficulty with concentration, disturbances in coordination, falling down, headaches, inability to speak, memory loss, numbness, poor balance, seizures, sensation of room spinning, tingling, tremors, visual disturbances, and weakness. Psych:  Complains of irritability; denies alternate hallucination ( auditory/visual), anxiety, depression, easily angered, easily tearful, mental problems, panic attacks, sense of great danger, suicidal thoughts/plans, thoughts of violence, unusual visions or sounds, and thoughts /plans of harming others. Endo:  Denies cold intolerance, excessive hunger, excessive thirst,  excessive urination, heat intolerance, polyuria, and weight change. Heme:  Denies abnormal bruising, bleeding, enlarge lymph nodes, fevers, pallor, and skin discoloration. Allergy:  Denies hives or rash, itching eyes, persistent infections, seasonal allergies, and sneezing.  Physical Exam  General:  Well-developed,well-nourished,in no acute distress; alert,appropriate and cooperative throughout examination Head:  Normocephalic and atraumatic without obvious abnormalities. No apparent alopecia or balding. Eyes:  pupils equal, pupils round, pupils reactive to light, and no injection.   Ears:  External ear exam shows no significant lesions or deformities.  Otoscopic examination reveals clear canals, tympanic membranes are intact bilaterally without bulging, retraction, inflammation or discharge. Hearing is grossly normal bilaterally. Nose:  External nasal examination shows no deformity or inflammation. Nasal mucosa are pink and moist without lesions or exudates. Mouth:  Oral mucosa and oropharynx without lesions or exudates.  Teeth in good repair. Neck:  No deformities, masses, or tenderness noted. Breasts:  gyn Lungs:  Normal respiratory effort, chest expands symmetrically. Lungs are clear to auscultation, no crackles or wheezes. Heart:  normal rate and no murmur.   Abdomen:  Bowel sounds positive,abdomen soft and non-tender without masses, organomegaly or hernias noted. Rectal:  gyn Genitalia:  gyn Msk:  normal ROM, no joint tenderness, no joint swelling, no joint warmth, no redness over joints, no joint deformities, and no joint instability.   Pulses:  R and L carotid,radial,femoral,dorsalis pedis and posterior tibial pulses are full and equal bilaterally Extremities:  No clubbing, cyanosis, edema, or deformity noted with normal full range of motion of all joints.   Neurologic:  No cranial nerve deficits noted. Station and gait are normal. Plantar reflexes are down-going bilaterally. DTRs are  symmetrical throughout. Sensory, motor and coordinative functions appear intact. Skin:  Intact without suspicious lesions or rashes Cervical Nodes:  No lymphadenopathy noted Axillary Nodes:  No palpable lymphadenopathy Psych:  Cognition and judgment appear intact. Alert and cooperative with normal attention span and concentration. No apparent delusions, illusions, hallucinations  Diabetes Management Exam:    Foot Exam (with socks and/or shoes not present):       Sensory-Pinprick/Light touch:          Left medial foot (L-4): normal          Left dorsal foot (L-5): normal          Left lateral foot (S-1): normal          Right medial foot (L-4): normal          Right dorsal foot (L-5): normal          Right lateral foot (S-1): normal       Sensory-Monofilament:          Left foot: normal          Right foot: normal       Inspection:          Left foot: normal          Right foot: normal       Nails:          Left foot: normal          Right foot: normal    Eye Exam:       Eye Exam done elsewhere          Date: 03/10/2010          Results: normal          Done by: optho   Impression & Recommendations:  Problem # 1:  PREVENTIVE HEALTH CARE (ICD-V70.0) fasting labs ghm utd  Problem # 2:  MENOPAUSAL SYNDROME (ICD-627.2)  Her updated medication list for this problem includes:    Estradiol 0.5 Mg Tabs (Estradiol) .Marland Kitchen... 1 by mouth qd  Orders: Gynecologic Referral (Gyn) EKG w/ Interpretation (93000)  Problem # 3:  ESSENTIAL HYPERTENSION (ICD-401.9)  Her updated medication list for this problem includes:    Lisinopril-hydrochlorothiazide 20-25 Mg Tabs (Lisinopril-hydrochlorothiazide) .Marland Kitchen... 1 by mouth once daily  BP today: 120/74 Prior BP: 140/80 (02/14/2010)  Labs Reviewed: K+: 5.1 (12/28/2009) Creat: : 0.8 (12/28/2009)   Chol: 159 (09/01/2009)   HDL: 42.90 (09/01/2009)   LDL: 84 (09/01/2009)   TG: 159.0 (09/01/2009)  Orders: EKG w/ Interpretation (93000)  Problem #  4:  DIABETES MELLITUS, TYPE I (ICD-250.01)  Her updated medication list for this problem includes:    Metformin Hcl 500 Mg Tabs (Metformin hcl) .Marland Kitchen... 1 by mouth daily.    Lisinopril-hydrochlorothiazide 20-25 Mg Tabs (Lisinopril-hydrochlorothiazide) .Marland Kitchen... 1 by mouth once daily  Labs Reviewed: Creat: 0.8 (12/28/2009)     Last Eye Exam: normal (03/10/2010) Reviewed HgBA1c results: 5.6 (09/01/2009)  5.6 (01/11/2009)  Orders: EKG w/ Interpretation (93000)  Complete Medication List: 1)  Metformin Hcl 500 Mg Tabs (Metformin hcl) .Marland Kitchen.. 1 by mouth daily. 2)  Estradiol 0.5 Mg Tabs (Estradiol) .Marland Kitchen.. 1 by mouth qd 3)  Cymbalta 60 Mg Cpep (Duloxetine  hcl) .... 2 by mouth once daily 4)  Alprazolam 0.25 Mg Tabs (Alprazolam) .Marland Kitchen.. 1 by mouth three times a day 5)  Epipen 0.3 Mg/0.63ml Devi (Epinephrine) .... As directed. 6)  Multivitamins Tabs (Multiple vitamin) .... One tablet by mouth once daily 7)  Tums 500 Mg Chew (Calcium carbonate antacid) .... Six tablet by mouth once daily 8)  Lisinopril-hydrochlorothiazide 20-25 Mg Tabs (Lisinopril-hydrochlorothiazide) .Marland Kitchen.. 1 by mouth once daily 9)  Tramadol Hcl 50 Mg Tabs (Tramadol hcl) .Marland Kitchen.. 1-2 by mouth every 6 hours as needed 10)  Lipitor 20 Mg Tabs (Atorvastatin calcium) .Marland Kitchen.. 1 by mouth once daily  Other Orders: Tdap => 47yrs IM (13086) Admin 1st Vaccine (57846)  Patient Instructions: 1)  V70.0  250.00, 272.4  lipid, bmp, hep, hgba1c, microalbumin, 729.1  vita D Prescriptions: ALPRAZOLAM 0.25 MG TABS (ALPRAZOLAM) 1 by mouth three times a day  #90 x 0   Entered and Authorized by:   Loreen Freud DO   Signed by:   Loreen Freud DO on 03/10/2010   Method used:   Print then Give to Patient   RxID:   [id number] LIPITOR 20 MG TABS (ATORVASTATIN CALCIUM) 1 by mouth once daily  #30 x 5   Entered and Authorized by:   Loreen Freud DO   Signed by:   Loreen Freud DO on 03/10/2010   Method used:   Electronically to        [id number]  (retail)       [address]       [place], [place]  [address]       Ph: [phone] or [phone]       Fax: [phone]   RxID:   [id number] TRAMADOL HCL 50 MG TABS (TRAMADOL HCL) 1-2 by mouth every 6 hours as needed  #60 x 0   Entered and Authorized by:   Loreen Freud DO   Signed by:   Loreen Freud DO on 03/10/2010   Method used:   Electronically to        [id number]* (retail)       [address]       [place], [place]  [address]       Ph: [phone] or [phone]       Fax: [phone]   RxID:   [id number] LISINOPRIL-HYDROCHLOROTHIAZIDE 20-25 MG TABS (LISINOPRIL-HYDROCHLOROTHIAZIDE) 1 by mouth once daily  #30 Tablet x 5   Entered and Authorized by:   Loreen Freud DO   Signed by:   Loreen Freud DO on 03/10/2010   Method used:   Electronically to        [id number] (retail)       [address]       [place], [place]  [address]       Ph: [phone] or [phone]       Fax: [phone]   RxID:   [id number] CYMBALTA 60 MG CPEP (DULOXETINE HCL) 2 by mouth once daily  #60 x 5   Entered and Authorized by:   Loreen Freud DO   Signed by:   Loreen Freud DO on 03/10/2010   Method used:   Electronically to        [id number] (retail)       [address]  [place], [place]  [address]       Ph: [phone] or [phone]       Fax: [phone]   RxID:   [id number] ESTRADIOL 0.5 MG TABS (ESTRADIOL) 1 by mouth qd  #30 x 11   Entered and Authorized by:   Loreen Freud DO   Signed by:   Loreen Freud DO on 03/10/2010   Method used:   Electronically to        [id number] (retail)       [address]       [place], [place]  [address]       Ph: [phone] or [phone]       Fax: [phone]   RxID:   [id number] METFORMIN HCL 500 MG TABS (METFORMIN HCL) 1 by mouth daily.   #30 Tablet x 5   Entered and Authorized by:   Loreen Freud DO   Signed by:   Loreen Freud DO on 03/10/2010   Method used:   Electronically to        [id number] (retail)       [address]       [place], [place]  [address]       Ph: [phone] or [phone]       Fax: [phone]   RxID:   [id number]    Orders Added: 1)  Gynecologic Referral [Gyn] 2)  Tdap => 75yrs IM [90715] 3)  Admin 1st Vaccine [90471] 4)  Est. Patient 40-64 years [99396] 5)  EKG w/ Interpretation [93000]   Immunizations Administered:  Tetanus Vaccine:    Vaccine Type: Tdap    Site: left deltoid    Mfr: Merck    Dose: 0.5 ml    Route: IM    Given by: Almeta Monas CMA (AAMA)    Exp. Date: 01/21/2012    Lot #: [id number]    VIS given: 02/19/08 version given March 10, 2010.   Immunizations Administered:  Tetanus Vaccine:    Vaccine Type: Tdap    Site: left deltoid    Mfr: Merck    Dose: 0.5 ml    Route: IM    Given by: Almeta Monas CMA (AAMA)    Exp. Date: 01/21/2012    Lot #: [id number]    VIS given: 02/19/08 version given March 10, 2010.

## 2010-05-03 NOTE — Progress Notes (Signed)
Summary: Refill  Phone Note Call from Patient Call back at Home Phone [phone]   Caller: Patient Call For: Loreen Freud DO Summary of Call: Patient is requesting rx for vesicare ,pristiq, epi pen. Pharmacy is [facility] on [address] Patient states she lost all these rx's at her last visit. Initial call taken by: Barb Merino,  May 18, 2009 4:12 PM  Follow-up for Phone Call        Pt states she was given Vesicare, i do not see this on her med list. Army Fossa CMA  May 18, 2009 4:42 PM   Additional Follow-up for Phone Call Additional follow up Details #1::        I added it Additional Follow-up by: Loreen Freud DO,  May 18, 2009 4:52 PM    Additional Follow-up for Phone Call Additional follow up Details #2::    Pt is aware. Army Fossa CMA  May 18, 2009 4:55 PM   New/Updated Medications: EPIPEN 0.3 MG/0.3ML DEVI (EPINEPHRINE) as directed. VESICARE 10 MG TABS (SOLIFENACIN SUCCINATE) 1 by mouth once daily Prescriptions: VESICARE 10 MG TABS (SOLIFENACIN SUCCINATE) 1 by mouth once daily  #30 x 5   Entered and Authorized by:   Loreen Freud DO   Signed by:   Loreen Freud DO on 05/18/2009   Method used:   Electronically to        [id number] (retail)       [address]       [place], [place]  [address]       Ph: [phone] or [phone]       Fax: [phone]   RxID:   [id number] PRISTIQ 50 MG XR24H-TAB (DESVENLAFAXINE SUCCINATE) 1 by mouth once daily  #30 x 2   Entered by:   Army Fossa CMA   Authorized by:   Loreen Freud DO   Signed by:   Loreen Freud DO on 05/18/2009   Method used:   Electronically to        [id number] (retail)       [address]       [place], [place]  [address]       Ph: [phone] or [phone]       Fax: [phone]   RxID:   [id number] EPIPEN 0.3 MG/0.3ML DEVI (EPINEPHRINE) as directed.  #1 x 2   Entered by:    Army Fossa CMA   Authorized by:   Loreen Freud DO   Signed by:   Army Fossa CMA on 05/18/2009   Method used:   Electronically to        [id number] (retail)       [address]       [place], [place]  [address]       Ph: [phone] or [phone]       Fax: [phone]   RxID:   [id number]

## 2010-05-03 NOTE — Assessment & Plan Note (Signed)
Summary: medicines adjusted//lh   Vital Signs:  Patient profile:   50 year old female Weight:      215 pounds Temp:     98.2 degrees F oral Pulse rate:   80 / minute Pulse rhythm:   regular BP sitting:   128 / 80  (left arm) Cuff size:   regular  Vitals Entered By: Army Fossa CMA (May 04, 2009 3:50 PM) CC: Discuss medications.  Pt states she is on Lexapro- pharmacist states they have never filled before they have filled celexa, Depressive symptoms   History of Present Illness:  Depressive symptoms      This is a 50 year old woman who presents with Depressive symptoms.  The patient reports depressed mood, loss of interest/pleasure, and significant weight gain, but denies significant weight loss, insomnia, hypersomnia, psychomotor agitation, and psychomotor retardation.  The patient also reports fatigue or loss of energy, feelings of worthlessness, and diminished concentration.  The patient denies indecisiveness, thoughts of death, thoughts of suicide, suicidal intent, and suicidal plans.  The patient reports the following psychosocial stressors: recent traumatic event and major life changes.  Patient's past history includes alcohol/substance abuse.  The patient denies abnormally elevated mood, abnormally irritable mood, decreased need for sleep, increased talkativeness, distractibility, flight of ideas, increased goal-directed activity, and inflated self-esteem/ grandiosity.    Current Medications (verified): 1)  Metformin Hcl 500 Mg Tabs (Metformin Hcl) .Marland Kitchen.. 1 By Mouth Daily. 2)  Estradiol 0.5 Mg Tabs (Estradiol) .Marland Kitchen.. 1 By Mouth Qd 3)  Pristiq 50 Mg Xr24h-Tab (Desvenlafaxine Succinate) .Marland Kitchen.. 1 By Mouth Once Daily 4)  Alprazolam 0.25 Mg Tabs (Alprazolam) .Marland Kitchen.. 1 By Mouth Three Times A Day  Allergies (verified): No Known Drug Allergies  Past History:  Past medical, surgical, family and social histories (including risk factors) reviewed for relevance to current acute and chronic  problems.  Past Medical History: Reviewed history from 01/08/2009 and no changes required. Depression Diabetes mellitus, type I Urinary incontinence High Blood Pressure Readings UTI   Past Surgical History: Reviewed history from 01/08/2009 and no changes required. Tubal ligation Knee Surgery [place]-2007   Family History: Reviewed history from 01/08/2009 and no changes required. Family History of Arthritis Family History Diabetes 1st degree relative Family History High cholesterol Family History Hypertension Stroke  Family History of Stroke M 1st degree relative <50  Social History: Reviewed history from 01/08/2009 and no changes required. Married Never Smoked Alcohol use-yes  Review of Systems      See HPI  Physical Exam  General:  Well-developed,well-nourished,in no acute distress; alert,appropriate and cooperative throughout examination Psych:  Oriented X3, memory intact for recent and remote, normally interactive, good eye contact, and slightly anxious.     Impression & Recommendations:  Problem # 1:  DEPRESSION (ICD-311)  Her updated medication list for this problem includes:    Pristiq 50 Mg Xr24h-tab (Desvenlafaxine succinate) .Marland Kitchen... 1 by mouth once daily    Alprazolam 0.25 Mg Tabs (Alprazolam) .Marland Kitchen... 1 by mouth three times a day  Discussed treatment options, including trial of antidpressant medication. Will refer to behavioral health.recheck in 4 weeks, sooner as needed. Patient agrees to call if any worsening of symptoms or thoughts of doing harm arise. Verified that the patient has no suicidal ideation at this time.   Complete Medication List: 1)  Metformin Hcl 500 Mg Tabs (Metformin hcl) .Marland Kitchen.. 1 by mouth daily. 2)  Estradiol 0.5 Mg Tabs (Estradiol) .Marland Kitchen.. 1 by mouth qd 3)  Pristiq 50 Mg Xr24h-tab (Desvenlafaxine  succinate) .Marland Kitchen.. 1 by mouth once daily 4)  Alprazolam 0.25 Mg Tabs (Alprazolam) .Marland Kitchen.. 1 by mouth three times a day Prescriptions: PRISTIQ 50 MG  XR24H-TAB (DESVENLAFAXINE SUCCINATE) 1 by mouth once daily  #30 x 2   Entered and Authorized by:   Loreen Freud DO   Signed by:   Loreen Freud DO on 05/04/2009   Method used:   Print then Give to Patient   RxID:   [id number] ALPRAZOLAM 0.25 MG TABS (ALPRAZOLAM) 1 by mouth three times a day  #90 x 0   Entered and Authorized by:   Loreen Freud DO   Signed by:   Loreen Freud DO on 05/04/2009   Method used:   Print then Give to Patient   RxID:   [id number]

## 2010-05-03 NOTE — Progress Notes (Signed)
Summary: refill  Phone Note Refill Request Message from:  Fax from Pharmacy on November 05, 2009 3:46 PM  Refills Requested: Medication #1:  ALPRAZOLAM 0.25 MG TABS 1 by mouth three times a day [facility] - [address] - fax [phone]  Initial call taken by: Okey Regal Spring,  November 05, 2009 3:46 PM  Follow-up for Phone Call        per pharmacy rx recieved on 11-03-09.Marland KitchenMarland KitchenMarland KitchenFelecia Deloach CMA  November 05, 2009 4:05 PM

## 2010-05-03 NOTE — Medication Information (Signed)
Summary: Care Consideration Regarding Adding a Statin/[facility]  Care Consideration Regarding Adding a Statin/[facility]   Imported By: Lanelle Bal 11/05/2009 10:05:14  _____________________________________________________________________  External Attachment:    Type:   Image     Comment:   External Document  Appended Document: Care Consideration Regarding Adding a Statin/[facility] pt is refusing cholesterol medication.

## 2010-05-03 NOTE — Progress Notes (Signed)
Summary: still cannot swallow/Dr Laury Axon see  Phone Note Call from Patient Call back at [phone]   Caller: Patient Summary of Call: was seen wed 07/08/09; wants to know is there any stronger liquid medicine I can take other than childrens tylenol?  Pt says she has went through a whole bottle and still cannot swallow Please advise Initial call taken by: Kandice Hams,  July 09, 2009 2:38 PM  Follow-up for Phone Call        childrens motrin also take augmentin 400/5  2 tsp by mouth two times a day  ov mon if no better -- if she gets worse over weekend go to ER Follow-up by: Loreen Freud DO,  July 09, 2009 2:54 PM     Appended Document: still cannot swallow/Dr Laury Axon see spoke with pt informed her od Dr Laury Axon recommendations and rx faxed to [facility] [address]

## 2010-05-03 NOTE — Progress Notes (Signed)
Summary: refill(lmom 4/5)  Phone Note Refill Request Message from:  Fax from Pharmacy on July 06, 2009 12:39 PM  citalopram hbr 20mg  [facility] [address] fax [phone]   Method Requested: Fax to Local Pharmacy Next Appointment Scheduled: 07/12/2009 Initial call taken by: Barb Merino,  July 06, 2009 12:40 PM  Follow-up for Phone Call        left message for pt- this no longer on her med list. Does she need? Army Fossa CMA  July 06, 2009 1:26 PM   Additional Follow-up for Phone Call Additional follow up Details #1::        filled Additional Follow-up by: Loreen Freud DO,  July 07, 2009 11:29 AM    New/Updated Medications: CITALOPRAM HYDROBROMIDE 20 MG TABS (CITALOPRAM HYDROBROMIDE) 1 by mouth once daily Prescriptions: CITALOPRAM HYDROBROMIDE 20 MG TABS (CITALOPRAM HYDROBROMIDE) 1 by mouth once daily  #30 x 5   Entered and Authorized by:   Loreen Freud DO   Signed by:   Loreen Freud DO on 07/07/2009   Method used:   Electronically to        [id number]* (retail)       [address]       [place], [place]  [address]       Ph: [phone] or [phone]       Fax: [phone]   RxID:   [id number]

## 2010-05-03 NOTE — Assessment & Plan Note (Signed)
Summary: TROBLE SWALLOWING//PH   Vital Signs:  Patient profile:   50 year old female Height:      66 inches Weight:      219 pounds Temp:     98.2 degrees F oral Pulse rate:   66 / minute BP sitting:   150 / 90  (left arm)  Vitals Entered By: Jeremy Johann CMA (November 08, 2009 1:55 PM) CC: difficulty swallowing vegetable   History of Present Illness: Pt here c/o difficulty swallowing foods----she feels like it gets stuck.  No heartburn, burping etc.   She coughs a lot with eating as well.    Current Medications (verified): 1)  Metformin Hcl 500 Mg Tabs (Metformin Hcl) .Marland Kitchen.. 1 By Mouth Daily. 2)  Estradiol 0.5 Mg Tabs (Estradiol) .Marland Kitchen.. 1 By Mouth Qd 3)  Cymbalta 60 Mg Cpep (Duloxetine Hcl) .... Take 1 Tab Once Daily 4)  Alprazolam 0.25 Mg Tabs (Alprazolam) .Marland Kitchen.. 1 By Mouth Three Times A Day 5)  Epipen 0.3 Mg/0.24ml Devi (Epinephrine) .... As Directed. 6)  Vesicare 10 Mg Tabs (Solifenacin Succinate) .Marland Kitchen.. 1 By Mouth Once Daily 7)  Augmentin 400/5 .... 2 Tsp By Mouth Two Times A Day 8)  Lipitor 20 Mg Tabs (Atorvastatin Calcium) .Marland Kitchen.. 1 By Mouth At Bedtime  Allergies (verified): No Known Drug Allergies  Past History:  Past medical, surgical, family and social histories (including risk factors) reviewed for relevance to current acute and chronic problems.  Past Medical History: Reviewed history from 01/08/2009 and no changes required. Depression Diabetes mellitus, type I Urinary incontinence High Blood Pressure Readings UTI   Past Surgical History: Reviewed history from 01/08/2009 and no changes required. Tubal ligation Knee Surgery [place]-2007   Family History: Reviewed history from 01/08/2009 and no changes required. Family History of Arthritis Family History Diabetes 1st degree relative Family History High cholesterol Family History Hypertension Stroke  Family History of Stroke M 1st degree relative <50  Social History: Reviewed history from 01/08/2009 and no  changes required. Married Never Smoked Alcohol use-yes  Review of Systems      See HPI  Physical Exam  General:  Well-developed,well-nourished,in no acute distress; alert,appropriate and cooperative throughout examination Neck:  No deformities, masses, or tenderness noted. Lungs:  Normal respiratory effort, chest expands symmetrically. Lungs are clear to auscultation, no crackles or wheezes. Heart:  normal rate and no murmur.   Abdomen:  Bowel sounds positive,abdomen soft and non-tender without masses, organomegaly or hernias noted. Psych:  Cognition and judgment appear intact. Alert and cooperative with normal attention span and concentration. No apparent delusions, illusions, hallucinations   Impression & Recommendations:  Problem # 1:  DYSPHAGIA UNSPECIFIED (ICD-787.20) prilosec otc daily avoid spicy, acidic foods, peppermint, caffeine etc  Orders: Gastroenterology Referral (GI)  Problem # 2:  COUGH (ICD-786.2)  Orders: T-2 View CXR (71020TC)  Problem # 3:  INSOMNIA (ICD-780.52)  Discussed sleep hygiene.   Complete Medication List: 1)  Metformin Hcl 500 Mg Tabs (Metformin hcl) .Marland Kitchen.. 1 by mouth daily. 2)  Estradiol 0.5 Mg Tabs (Estradiol) .Marland Kitchen.. 1 by mouth qd 3)  Cymbalta 60 Mg Cpep (Duloxetine hcl) .... Take 1 tab once daily 4)  Alprazolam 0.25 Mg Tabs (Alprazolam) .Marland Kitchen.. 1 by mouth three times a day 5)  Epipen 0.3 Mg/0.68ml Devi (Epinephrine) .... As directed. 6)  Vesicare 10 Mg Tabs (Solifenacin succinate) .Marland Kitchen.. 1 by mouth once daily 7)  Augmentin 400/5  .... 2 tsp by mouth two times a day 8)  Lipitor 20 Mg Tabs (Atorvastatin  calcium) .Marland Kitchen.. 1 by mouth at bedtime Prescriptions: CYMBALTA 60 MG CPEP (DULOXETINE HCL) take 1 tab once daily  #30 x 5   Entered and Authorized by:   Loreen Freud DO   Signed by:   Loreen Freud DO on 11/08/2009   Method used:   Electronically to        [id number]* (retail)       [address]       [place], [place]  [address]       Ph: [phone] or [phone]       Fax: [phone]   RxID:   [id number] LIPITOR 20 MG TABS (ATORVASTATIN CALCIUM) 1 by mouth at bedtime  #30 x 2   Entered and Authorized by:   Loreen Freud DO   Signed by:   Loreen Freud DO on 11/08/2009   Method used:   Print then Give to Patient   RxID:   [id number]

## 2010-05-03 NOTE — Progress Notes (Signed)
Summary: 9-30-lab results  Phone Note Outgoing Call   Details for Reason: Rheumatoid (RA) Factor-minimally +----non specific Summary of Call: left message to call office ..................Marland KitchenFelecia Deloach CMA  December 31, 2009 9:31 AM    crp high---pt high cardiovascular risk need to be aggressive with lipids ----pt should be on a statin----we tried to start Lipitor in past but it doesn't look like pt took it? start crestor 5 mg #30  1 by mouth once daily , 2 refills and give coupon card-----recheck 3 months----272.4  250.00  [facility]  vita D low--- take vita D 3 2000u daily---recheck 3 months  Left message to call back......Marland Kitchen Almeta Monas CMA Duncan Dull)  January 03, 2010 8:57 AM    Follow-up for Phone Call        Spk with pt adv of her results, pt voiced understanding, says she still has her Lipitor Rx of 20 mg that we called in earlier this month, but she never started it, says she will go ahead and start taking it since "statin" was recommended. Adv pt I will make Dr.Lowne aware. Please advise if it is ok to add Lipitor 20 mg back to the med list instead of crestor. Follow-up by: Almeta Monas CMA Duncan Dull),  January 03, 2010 9:05 AM  Additional Follow-up for Phone Call Additional follow up Details #1::        that is fine Additional Follow-up by: Loreen Freud DO,  January 03, 2010 9:19 AM    New/Updated Medications: LIPITOR 20 MG TABS (ATORVASTATIN CALCIUM) 1 by mouth once daily

## 2010-05-03 NOTE — Progress Notes (Signed)
Summary:  pt needs lab appt - pt didnt return call  Phone Note Outgoing Call Call back at [facility] Phone [phone]   Summary of Call: I refilled patients metformin x 1 only.  Per labs 10/10 patient was due labs 04/2009.  Please call patient & schedule lab visit.  a1c, lipid, bmp, lft 250.00, 272.4 Shary Decamp  Aug 07, 2009 8:47 AM   Follow-up for Phone Call        lm for to call to schedule lab appt .Marland KitchenOkey Regal Spring  Aug 09, 2009 12:21 PM   Additional Follow-up for Phone Call Additional follow up Details #1::        lm am for patient to call re lab appt Additional Follow-up by: Okey Regal Spring,  Aug 10, 2009 10:53 AM    Additional Follow-up for Phone Call Additional follow up Details #2::    patient hasnt returned call for appt Follow-up by: Okey Regal Spring,  Aug 13, 2009 11:49 AM  Additional Follow-up for Phone Call Additional follow up Details #3:: Details for Additional Follow-up Action Taken: Letter Mailed./[facility]  Aug 13, 2009 11:52 AM

## 2010-05-03 NOTE — Assessment & Plan Note (Signed)
Summary: 2 WEEK FOLLOWUP; FLU SHOT///SPH   Vital Signs:  Patient profile:   50 year old female Weight:      217.6 pounds Temp:     98.8 degrees F oral Pulse rate:   80 / minute Pulse rhythm:   regular BP sitting:   124 / 90  (left arm)  Vitals Entered By: Almeta Monas CMA Duncan Dull) (December 28, 2009 11:08 AM) CC: 2 WEEK F/U AND FLU VACCINE Flu Vaccine Consent Questions     Do you have a history of severe allergic reactions to this vaccine? no    Any prior history of allergic reactions to egg and/or gelatin? no    Do you have a sensitivity to the preservative Thimersol? no    Do you have a past history of Guillan-Barre Syndrome? no    Do you currently have an acute febrile illness? no    Have you ever had a severe reaction to latex? no    Vaccine information given and explained to patient? yes    Are you currently pregnant? no    Lot Number:[id number]   Exp Date:10/01/2010   Site Given  Left Deltoid IM   History of Present Illness: Pt here for bp check. Pt still c/o aching all over.  Head and neck better with chiropractor but c/o swelling and joint pains all over.  Pt was supposed to see a rheum years ago but her insurance would not pay for it so she did not go.  They did put her on prednisone and pain went away.    Current Medications (verified): 1)  Metformin Hcl 500 Mg Tabs (Metformin Hcl) .Marland Kitchen.. 1 By Mouth Daily. 2)  Estradiol 0.5 Mg Tabs (Estradiol) .Marland Kitchen.. 1 By Mouth Qd 3)  Cymbalta 60 Mg Cpep (Duloxetine Hcl) .... 2 By Mouth Once Daily 4)  Alprazolam 0.25 Mg Tabs (Alprazolam) .Marland Kitchen.. 1 By Mouth Three Times A Day 5)  Epipen 0.3 Mg/0.32ml Devi (Epinephrine) .... As Directed. 6)  Multivitamins   Tabs (Multiple Vitamin) .... One Tablet By Mouth Once Daily 7)  Tums 500 Mg Chew (Calcium Carbonate Antacid) .... Six Tablet By Mouth Once Daily 8)  Lisinopril-Hydrochlorothiazide 20-25 Mg Tabs (Lisinopril-Hydrochlorothiazide) .Marland Kitchen.. 1 By Mouth Once Daily 9)  Flexeril 10 Mg Tabs  (Cyclobenzaprine Hcl) .Marland Kitchen.. 1 By Mouth Three Times A Day As Needed 10)  Tramadol Hcl 50 Mg Tabs (Tramadol Hcl) .... Take One Tablet Every 6 Hours As Needed  Allergies (verified): No Known Drug Allergies  Past History:  Past medical, surgical, family and social histories (including risk factors) reviewed for relevance to current acute and chronic problems.  Past Medical History: Reviewed history from 12/03/2009 and no changes required. COUGH (ICD-786.2) INSOMNIA (ICD-780.52) DYSPHAGIA UNSPECIFIED (ICD-787.20) ACUTE PHARYNGITIS (ICD-462) PREVENTIVE HEALTH CARE (ICD-V70.0) FAMILY HISTORY DIABETES 1ST DEGREE RELATIVE (ICD-V18.0) URINARY INCONTINENCE (ICD-788.30) DIABETES MELLITUS, TYPE I (ICD-250.01) DEPRESSION (ICD-311)  High Blood Pressure Readings  Past Surgical History: Reviewed history from 12/03/2009 and no changes required. Tubal ligation Knee Surgery--Left  [place]-2007  Mini Gastric Bypass Surgery   Family History: Reviewed history from 12/03/2009 and no changes required. Family History of Arthritis Family History Diabetes 1st degree relative: Dad, and Paternal Dad Side Family History High cholesterol Family History Hypertension Stroke  Family History of Stroke M 1st degree relative <50 No FH of Colon Cancer: Family History of Heart Disease: Pateranl Side   Social History: Reviewed history from 12/03/2009 and no changes required. Retired Married No Childern Patient is a former smoker.  Alcohol Use -  no Daily Caffeine Use: 3 daily  Illicit Drug Use - no  Review of Systems      See HPI  Physical Exam  General:  Well-developed,well-nourished,in no acute distress; alert,appropriate and cooperative throughout examination Lungs:  Normal respiratory effort, chest expands symmetrically. Lungs are clear to auscultation, no crackles or wheezes. Heart:  normal rate and no murmur.   Extremities:  No clubbing, cyanosis, edema, or deformity noted with normal full  range of motion of all joints.   Psych:  Cognition and judgment appear intact. Alert and cooperative with normal attention span and concentration. No apparent delusions, illusions, hallucinations   Impression & Recommendations:  Problem # 1:  CERVICAL MUSCLE STRAIN (ICD-847.0)  Her updated medication list for this problem includes:    Flexeril 10 Mg Tabs (Cyclobenzaprine hcl) .Marland Kitchen... 1 by mouth three times a day as needed    Tramadol Hcl 50 Mg Tabs (Tramadol hcl) .Marland Kitchen... Take one tablet every 6 hours as needed  Discussed exercises and use of moist heat or cold and medication.   Problem # 2:  PAIN IN JOINT, MULTIPLE SITES (ICD-719.49)  Orders: Venipuncture ([address]) TLB-BMP (Basic Metabolic Panel-BMET) (80048-METABOL) TLB-CBC Platelet - w/Differential (85025-CBCD) TLB-Hepatic/Liver Function Pnl (80076-HEPATIC) TLB-TSH (Thyroid Stimulating Hormone) (84443-TSH) TLB-Sedimentation Rate (ESR) (85652-ESR) T-Antinuclear Antib (ANA) [phone]) T-Vitamin D (25-Hydroxy) (91478-29562) TLB-CRP-High Sensitivity (C-Reactive Protein) (86140-FCRP) Rheumatology Referral (Rheumatology)  Problem # 3:  MYALGIA (ICD-729.1)  Her updated medication list for this problem includes:    Flexeril 10 Mg Tabs (Cyclobenzaprine hcl) .Marland Kitchen... 1 by mouth three times a day as needed    Tramadol Hcl 50 Mg Tabs (Tramadol hcl) .Marland Kitchen... Take one tablet every 6 hours as needed  Orders: Venipuncture (13086) TLB-BMP (Basic Metabolic Panel-BMET) (80048-METABOL) TLB-CBC Platelet - w/Differential (85025-CBCD) TLB-Hepatic/Liver Function Pnl (80076-HEPATIC) TLB-TSH (Thyroid Stimulating Hormone) (84443-TSH) TLB-Sedimentation Rate (ESR) (85652-ESR) T-Antinuclear Antib (ANA) [phone]) T-Vitamin D (25-Hydroxy) [phone]) TLB-CRP-High Sensitivity (C-Reactive Protein) (86140-FCRP) T-Vitamin D (25-Hydroxy) (02725-36644) Rheumatology Referral (Rheumatology)  Problem # 4:  ESSENTIAL HYPERTENSION (ICD-401.9)  Her updated  medication list for this problem includes:    Lisinopril-hydrochlorothiazide 20-25 Mg Tabs (Lisinopril-hydrochlorothiazide) .Marland Kitchen... 1 by mouth once daily  BP today: 124/90 Prior BP: 142/94 (12/14/2009)  Labs Reviewed: K+: 5.6 (12/14/2009) Creat: : 0.6 (12/14/2009)   Chol: 159 (09/01/2009)   HDL: 42.90 (09/01/2009)   LDL: 84 (09/01/2009)   TG: 159.0 (09/01/2009)  Complete Medication List: 1)  Metformin Hcl 500 Mg Tabs (Metformin hcl) .Marland Kitchen.. 1 by mouth daily. 2)  Estradiol 0.5 Mg Tabs (Estradiol) .Marland Kitchen.. 1 by mouth qd 3)  Cymbalta 60 Mg Cpep (Duloxetine hcl) .... 2 by mouth once daily 4)  Alprazolam 0.25 Mg Tabs (Alprazolam) .Marland Kitchen.. 1 by mouth three times a day 5)  Epipen 0.3 Mg/0.60ml Devi (Epinephrine) .... As directed. 6)  Multivitamins Tabs (Multiple vitamin) .... One tablet by mouth once daily 7)  Tums 500 Mg Chew (Calcium carbonate antacid) .... Six tablet by mouth once daily 8)  Lisinopril-hydrochlorothiazide 20-25 Mg Tabs (Lisinopril-hydrochlorothiazide) .Marland Kitchen.. 1 by mouth once daily 9)  Flexeril 10 Mg Tabs (Cyclobenzaprine hcl) .Marland Kitchen.. 1 by mouth three times a day as needed 10)  Tramadol Hcl 50 Mg Tabs (Tramadol hcl) .... Take one tablet every 6 hours as needed  Other Orders: Admin 1st Vaccine (03474) Flu Vaccine 65yrs + ([address])  Patient Instructions: 1)  Please schedule a follow-up appointment in 3 months .  Prescriptions: CYMBALTA 60 MG CPEP (DULOXETINE HCL) 2 by mouth once daily  #60 x 5   Entered and Authorized  by:   Loreen Freud DO   Signed by:   Loreen Freud DO on 12/28/2009   Method used:   Electronically to        [id number] (retail)       [address]       [place], [place]  [address]       Ph: [phone] or [phone]       Fax: [phone]   RxID:   [id number] LISINOPRIL-HYDROCHLOROTHIAZIDE 20-25 MG TABS (LISINOPRIL-HYDROCHLOROTHIAZIDE) 1 by mouth once daily  #30 x 2   Entered and Authorized by:   Loreen Freud DO   Signed by:    Loreen Freud DO on 12/28/2009   Method used:   Electronically to        [id number] (retail)       [address]       [place], [place]  [address]       Ph: [phone] or [phone]       Fax: [phone]   RxID:   [id number]

## 2010-05-05 NOTE — Letter (Signed)
Summary: Care Consideration Regarding Eye Exam/[facility]  Care Consideration Regarding Eye Exam/[facility]   Imported By: Lanelle Bal 04/22/2010 08:13:19  _____________________________________________________________________  External Attachment:    Type:   Image     Comment:   External Document

## 2010-05-05 NOTE — Progress Notes (Signed)
Summary: Results 12/27  Phone Note Outgoing Call   Call placed by: Almeta Monas CMA Duncan Dull),  March 29, 2010 10:45 AM Call placed to: Patient Details for Reason: HDL low and LDL very low----decrease Lipitor to 10 mg daily  #30   2 refills DM controlled----  con't other meds  recheck 3 months----272.4  250.00  lipid, hep, bmp hgba1c   low vita D ----vita D 50000u weekly and take vita D 3 2000u daily ----recheck 3 months Summary of Call: Left message to call back.... Almeta Monas CMA Duncan Dull)  March 29, 2010 10:46 AM   pt aware of the above lab results. Rx to [facility] aid. wants a copy of labs mailed to her and Rx for metformin faxed to [facility] @ [phone] for a 90 day supply. Initial call taken by: Almeta Monas CMA Duncan Dull),  March 29, 2010 4:42 PM    New/Updated Medications: LIPITOR 10 MG TABS (ATORVASTATIN CALCIUM) 1 by mouth at bedtime VITAMIN D (ERGOCALCIFEROL) 50000 UNIT CAPS (ERGOCALCIFEROL) 1 by mouth weekly Prescriptions: METFORMIN HCL 500 MG TABS (METFORMIN HCL) 1 by mouth daily.  #90 x 1   Entered by:   Almeta Monas CMA (AAMA)   Authorized by:   Loreen Freud DO   Signed by:   Almeta Monas CMA (AAMA) on 03/29/2010   Method used:   Printed then faxed to ...       [facility]  [address] [id number]* (retail)       [address]       [place], [place]  [address]       Ph: [phone] or [phone]       Fax: [phone]   RxID:   [id number] LIPITOR 10 MG TABS (ATORVASTATIN CALCIUM) 1 by mouth at bedtime  #90 x 0   Entered by:   Almeta Monas CMA (AAMA)   Authorized by:   Loreen Freud DO   Signed by:   Almeta Monas CMA (AAMA) on 03/29/2010   Method used:   Faxed to ...       [facility]  [address] [id number]* (retail)       [address]       [place], [place]  [address]       Ph: [phone] or [phone]       Fax: [phone]   RxID:   [id number] VITAMIN D (ERGOCALCIFEROL) 50000 UNIT CAPS  (ERGOCALCIFEROL) 1 by mouth weekly  #4 x 3   Entered by:   Almeta Monas CMA (AAMA)   Authorized by:   Loreen Freud DO   Signed by:   Almeta Monas CMA (AAMA) on 03/29/2010   Method used:   Electronically to        [facility] [address] [id number]* (retail)       [address]       [place], [place]  [address]       Ph: [phone] or [phone]       Fax: [phone]   RxID:   [id number]

## 2010-05-05 NOTE — Medication Information (Signed)
Summary: Care Consideration Regarding Lipitor & Skeletal Muscle Effects/M  Care Consideration Regarding Lipitor & Skeletal Muscle Effects/[facility]   Imported By: Lanelle Bal 04/08/2010 11:30:15  _____________________________________________________________________  External Attachment:    Type:   Image     Comment:   External Document

## 2010-05-05 NOTE — Consult Note (Signed)
Summary: [facility]   Imported By: Lanelle Bal 03/31/2010 12:00:22  _____________________________________________________________________  External Attachment:    Type:   Image     Comment:   External Document

## 2010-05-05 NOTE — Letter (Signed)
Summary: Care Consideration Regarding Eye Exam/[facility]  Care Consideration Regarding Eye Exam/[facility]   Imported By: Lanelle Bal 04/01/2010 11:01:21  _____________________________________________________________________  External Attachment:    Type:   Image     Comment:   External Document

## 2010-05-30 ENCOUNTER — Other Ambulatory Visit: Payer: Self-pay | Admitting: Family Medicine

## 2010-05-30 ENCOUNTER — Ambulatory Visit (INDEPENDENT_AMBULATORY_CARE_PROVIDER_SITE_OTHER): Payer: BC Managed Care – PPO | Admitting: Family Medicine

## 2010-05-30 ENCOUNTER — Encounter: Payer: Self-pay | Admitting: Family Medicine

## 2010-05-30 DIAGNOSIS — R32 Unspecified urinary incontinence: Secondary | ICD-10-CM

## 2010-05-30 DIAGNOSIS — N390 Urinary tract infection, site not specified: Secondary | ICD-10-CM

## 2010-05-30 DIAGNOSIS — E109 Type 1 diabetes mellitus without complications: Secondary | ICD-10-CM

## 2010-05-30 LAB — CONVERTED CEMR LAB
Bilirubin Urine: NEGATIVE
Protein, U semiquant: NEGATIVE
Urobilinogen, UA: 0.2
pH: 6

## 2010-05-30 LAB — LIPID PANEL
HDL: 45.7 mg/dL (ref >39.00)
LDL Cholesterol: 42 mg/dL (ref 0–99)
Total CHOL/HDL Ratio: 2

## 2010-05-30 LAB — HEPATIC FUNCTION PANEL
Alkaline Phosphatase: 41 U/L (ref 39–117)
Bilirubin, Direct: 0.1 mg/dL (ref 0.0–0.3)
Total Bilirubin: 0.4 mg/dL (ref 0.3–1.2)
Total Protein: 6.6 g/dL (ref 6.0–8.3)

## 2010-05-30 LAB — BASIC METABOLIC PANEL
CO2: 30 mEq/L (ref 19–32)
Calcium: 8.8 mg/dL (ref 8.4–10.5)
Chloride: 98 mEq/L (ref 96–112)
Sodium: 134 mEq/L — ABNORMAL LOW (ref 135–145)

## 2010-05-31 ENCOUNTER — Encounter: Payer: Self-pay | Admitting: Family Medicine

## 2010-05-31 ENCOUNTER — Other Ambulatory Visit: Payer: Self-pay | Admitting: Family Medicine

## 2010-05-31 DIAGNOSIS — Z1231 Encounter for screening mammogram for malignant neoplasm of breast: Secondary | ICD-10-CM

## 2010-06-09 NOTE — Assessment & Plan Note (Signed)
Summary: DISCUSS URGENT CARE VISIT FOR ? KIDNEY INF/RH.....   Vital Signs:  Patient profile:   50 year old female Weight:      207.2 pounds Temp:     98.5 degrees F oral BP sitting:   120 / 80  (left arm) Cuff size:   large  Vitals Entered By: Almeta Monas CMA Duncan Dull) (May 30, 2010 9:21 AM) CC: f/u on possible kidney infection   History of Present Illness: Pt had UTI  in UC just before Valentines Day--- pt just wanted to check and make sure it was better.  Pt is also urinating more at night.    Current Medications (verified): 1)  Metformin Hcl 500 Mg Tabs (Metformin Hcl) .Marland Kitchen.. 1 By Mouth Daily. 2)  Estradiol 0.5 Mg Tabs (Estradiol) .Marland Kitchen.. 1 By Mouth Qd 3)  Cymbalta 60 Mg Cpep (Duloxetine Hcl) .... 2 By Mouth Once Daily 4)  Alprazolam 0.25 Mg Tabs (Alprazolam) .Marland Kitchen.. 1 By Mouth Three Times A Day 5)  Epipen 0.3 Mg/0.65ml Devi (Epinephrine) .... As Directed. 6)  Multivitamins   Tabs (Multiple Vitamin) .... One Tablet By Mouth Once Daily 7)  Tums 500 Mg Chew (Calcium Carbonate Antacid) .... Six Tablet By Mouth Once Daily 8)  Lisinopril-Hydrochlorothiazide 20-25 Mg Tabs (Lisinopril-Hydrochlorothiazide) .Marland Kitchen.. 1 By Mouth Once Daily 9)  Tramadol Hcl 50 Mg Tabs (Tramadol Hcl) .Marland Kitchen.. 1-2 By Mouth Every 6 Hours As Needed 10)  Lipitor 10 Mg Tabs (Atorvastatin Calcium) .Marland Kitchen.. 1 By Mouth At Bedtime 11)  Vitamin D (Ergocalciferol) 50000 Unit Caps (Ergocalciferol) .Marland Kitchen.. 1 By Mouth Weekly 12)  Soma 350 Mg Tabs (Carisoprodol) .Marland Kitchen.. 1 By Mouth Qid As Needed  Allergies (verified): No Known Drug Allergies  Past History:  Past medical, surgical, family and social histories (including risk factors) reviewed for relevance to current acute and chronic problems.  Past Medical History: Reviewed history from 12/03/2009 and no changes required. COUGH (ICD-786.2) INSOMNIA (ICD-780.52) DYSPHAGIA UNSPECIFIED (ICD-787.20) ACUTE PHARYNGITIS (ICD-462) PREVENTIVE HEALTH CARE (ICD-V70.0) FAMILY HISTORY DIABETES  1ST DEGREE RELATIVE (ICD-V18.0) URINARY INCONTINENCE (ICD-788.30) DIABETES MELLITUS, TYPE I (ICD-250.01) DEPRESSION (ICD-311)  High Blood Pressure Readings  Past Surgical History: Reviewed history from 12/03/2009 and no changes required. Tubal ligation Knee Surgery--Left  [place]-2007  Mini Gastric Bypass Surgery   Family History: Reviewed history from 12/03/2009 and no changes required. Family History of Arthritis Family History Diabetes 1st degree relative: Dad, and Paternal Dad Side Family History High cholesterol Family History Hypertension Stroke  Family History of Stroke M 1st degree relative <50 No FH of Colon Cancer: Family History of Heart Disease: Pateranl Side   Social History: Reviewed history from 12/03/2009 and no changes required. Retired Married No Childern Patient is a former smoker.  Alcohol Use - no Daily Caffeine Use: 3 daily  Illicit Drug Use - no  Review of Systems      See HPI  Physical Exam  General:  Well-developed,well-nourished,in no acute distress; alert,appropriate and cooperative throughout examination Psych:  Cognition and judgment appear intact. Alert and cooperative with normal attention span and concentration. No apparent delusions, illusions, hallucinations   Impression & Recommendations:  Problem # 1:  UTI (ICD-599.0)  Orders: T-Culture, Urine ([id number])  Problem # 2:  DIABETES MELLITUS, TYPE I (ICD-250.01)  Her updated medication list for this problem includes:    Metformin Hcl 500 Mg Tabs (Metformin hcl) .Marland Kitchen... 1 by mouth daily.    Lisinopril-hydrochlorothiazide 20-25 Mg Tabs (Lisinopril-hydrochlorothiazide) .Marland Kitchen... 1 by mouth once daily  Orders: Venipuncture (78469) TLB-Lipid Panel (80061-LIPID) TLB-BMP (Basic  Metabolic Panel-BMET) (80048-METABOL) TLB-Hepatic/Liver Function Pnl (80076-HEPATIC) TLB-A1C / Hgb A1C (Glycohemoglobin) (83036-A1C) Specimen Handling (11914)  Complete Medication List: 1)  Metformin Hcl  500 Mg Tabs (Metformin hcl) .Marland Kitchen.. 1 by mouth daily. 2)  Estradiol 0.5 Mg Tabs (Estradiol) .Marland Kitchen.. 1 by mouth qd 3)  Cymbalta 60 Mg Cpep (Duloxetine hcl) .... 2 by mouth once daily 4)  Alprazolam 0.25 Mg Tabs (Alprazolam) .Marland Kitchen.. 1 by mouth three times a day 5)  Epipen 0.3 Mg/0.31ml Devi (Epinephrine) .... As directed. 6)  Multivitamins Tabs (Multiple vitamin) .... One tablet by mouth once daily 7)  Tums 500 Mg Chew (Calcium carbonate antacid) .... Six tablet by mouth once daily 8)  Lisinopril-hydrochlorothiazide 20-25 Mg Tabs (Lisinopril-hydrochlorothiazide) .Marland Kitchen.. 1 by mouth once daily 9)  Tramadol Hcl 50 Mg Tabs (Tramadol hcl) .Marland Kitchen.. 1-2 by mouth every 6 hours as needed 10)  Lipitor 10 Mg Tabs (Atorvastatin calcium) .Marland Kitchen.. 1 by mouth at bedtime 11)  Vitamin D (ergocalciferol) 50000 Unit Caps (Ergocalciferol) .Marland Kitchen.. 1 by mouth weekly 12)  Soma 350 Mg Tabs (Carisoprodol) .Marland Kitchen.. 1 by mouth qid as needed  Other Orders: Radiology Referral (Radiology)   Orders Added: 1)  T-Culture, Urine [[id number]] 2)  Venipuncture [86578] 3)  TLB-Lipid Panel [80061-LIPID] 4)  TLB-BMP (Basic Metabolic Panel-BMET) [80048-METABOL] 5)  TLB-Hepatic/Liver Function Pnl [80076-HEPATIC] 6)  TLB-A1C / Hgb A1C (Glycohemoglobin) [83036-A1C] 7)  Radiology Referral [Radiology] 8)  Specimen Handling [99000] 9)  Est. Patient Level III [46962]    Laboratory Results   Urine Tests    Routine Urinalysis   Color: straw Appearance: Clear Glucose: negative   (Normal Range: Negative) Bilirubin: negative   (Normal Range: Negative) Ketone: negative   (Normal Range: Negative) Spec. Gravity: 1.010   (Normal Range: 1.003-1.035) Blood: trace-lysed   (Normal Range: Negative) pH: 6.0   (Normal Range: 5.0-8.0) Protein: negative   (Normal Range: Negative) Urobilinogen: 0.2   (Normal Range: 0-1) Nitrite: negative   (Normal Range: Negative) Leukocyte Esterace: negative   (Normal Range: Negative)

## 2010-06-14 ENCOUNTER — Encounter (INDEPENDENT_AMBULATORY_CARE_PROVIDER_SITE_OTHER): Payer: Self-pay | Admitting: *Deleted

## 2010-06-16 ENCOUNTER — Ambulatory Visit: Payer: BC Managed Care – PPO

## 2010-06-20 ENCOUNTER — Ambulatory Visit
Admission: RE | Admit: 2010-06-20 | Discharge: 2010-06-20 | Disposition: A | Discharge status: Home / Self Care | Payer: BC Managed Care – PPO | Source: Ambulatory Visit | Attending: Family Medicine | Admitting: Family Medicine

## 2010-06-20 DIAGNOSIS — Z1231 Encounter for screening mammogram for malignant neoplasm of breast: Secondary | ICD-10-CM

## 2010-06-20 LAB — HM MAMMOGRAPHY: HM Mammogram: NEGATIVE

## 2010-06-21 NOTE — Letter (Signed)
Summary: Generic Letter  [facility] [place]  [address] [place], [place] [address]   Phone: [phone]  Fax: [phone]    06/14/2010     [facility] [address] [place], [place]  [address]  [place]     Dear Ms. DYAL,    I have tried to contact you in reference to your most recent labs without success. It is very important that you contact me for your results. Please give me a call [phone]. I will be available Monday-Friday from 8am until 5pm.       Sincerely,   Almeta Monas CMA (AAMA)

## 2010-06-22 ENCOUNTER — Other Ambulatory Visit: Payer: Self-pay | Admitting: Family Medicine

## 2010-06-22 MED ORDER — CARISOPRODOL 350 MG PO TABS: 350.0000 mg | ORAL_TABLET | Freq: Four times a day (QID) | ORAL | Status: DC | PRN

## 2010-06-22 NOTE — Telephone Encounter (Signed)
Approved on EMR for 1 fill by Dr.Lowne.... Kp

## 2010-06-23 ENCOUNTER — Other Ambulatory Visit: Payer: Self-pay

## 2010-06-23 NOTE — Telephone Encounter (Signed)
RX resent to pharmacy.... KP

## 2010-08-03 ENCOUNTER — Ambulatory Visit (INDEPENDENT_AMBULATORY_CARE_PROVIDER_SITE_OTHER): Payer: BC Managed Care – PPO | Admitting: Family Medicine

## 2010-08-03 ENCOUNTER — Encounter: Payer: Self-pay | Admitting: Family Medicine

## 2010-08-03 DIAGNOSIS — Z0000 Encounter for general adult medical examination without abnormal findings: Secondary | ICD-10-CM

## 2010-08-03 DIAGNOSIS — M79609 Pain in unspecified limb: Secondary | ICD-10-CM

## 2010-08-03 DIAGNOSIS — M79603 Pain in arm, unspecified: Secondary | ICD-10-CM

## 2010-08-03 DIAGNOSIS — M542 Cervicalgia: Secondary | ICD-10-CM

## 2010-08-03 MED ORDER — CARISOPRODOL 350 MG PO TABS: 350.0000 mg | ORAL_TABLET | Freq: Four times a day (QID) | ORAL | Status: DC | PRN

## 2010-08-03 NOTE — Progress Notes (Signed)
  Subjective:    Amber Mata is a 50 y.o. female who presents for follow up of low back problems. Current symptoms include: pain in R arm and shoulder (burning and throbbing in character; 4/10 in severity). Symptoms have worsened from the previous visit. Exacerbating factors identified by the patient are using arm.  The following portions of the patient's history were reviewed and updated as appropriate: allergies, current medications, past family history, past medical history, past social history, past surgical history and problem list.    Objective:    BP 120/74  Pulse 80  Wt 209 lb 6.4 oz (94.983 kg) Extremities: extremities normal, atraumatic, no cyanosis or edema Pulses: 2+ and symmetric Neurologic: Grossly normal   musc-- muscle spasm  R trap Assessment:    neck pain    Plan:    NSAIDs per medication orders. Muscle relaxants per medication orders. Orthopedic referral due to duration of pain.

## 2010-08-24 ENCOUNTER — Encounter: Payer: Self-pay | Admitting: Family Medicine

## 2010-08-31 ENCOUNTER — Other Ambulatory Visit: Payer: Self-pay | Admitting: Family Medicine

## 2010-08-31 NOTE — Telephone Encounter (Signed)
Faxed.   KP 

## 2010-08-31 NOTE — Telephone Encounter (Signed)
Last seen 08/03/10 and filled 03/10/10 # 90 with 0 refills   please advise     KP

## 2010-09-07 ENCOUNTER — Other Ambulatory Visit: Payer: BC Managed Care – PPO | Admitting: Gastroenterology

## 2010-09-07 ENCOUNTER — Ambulatory Visit (AMBULATORY_SURGERY_CENTER): Payer: BC Managed Care – PPO | Admitting: *Deleted

## 2010-09-07 VITALS — Ht 66.0 in | Wt 205.0 lb

## 2010-09-07 DIAGNOSIS — Z1211 Encounter for screening for malignant neoplasm of colon: Secondary | ICD-10-CM

## 2010-09-07 MED ORDER — PEG-KCL-NACL-NASULF-NA ASC-C 100 G PO SOLR: ORAL | Status: DC

## 2010-09-21 ENCOUNTER — Ambulatory Visit (AMBULATORY_SURGERY_CENTER): Payer: BC Managed Care – PPO | Admitting: Gastroenterology

## 2010-09-21 VITALS — BP 139/71 | HR 61 | Temp 98.5°F | Resp 18 | Ht 66.0 in | Wt 205.0 lb

## 2010-09-21 DIAGNOSIS — K648 Other hemorrhoids: Secondary | ICD-10-CM

## 2010-09-21 DIAGNOSIS — Z1211 Encounter for screening for malignant neoplasm of colon: Secondary | ICD-10-CM

## 2010-09-21 LAB — GLUCOSE, CAPILLARY: Glucose-Capillary: 116 mg/dL — ABNORMAL HIGH (ref 70–99)

## 2010-09-21 MED ORDER — SODIUM CHLORIDE 0.9 % IV SOLN: 500.0000 mL | INTRAVENOUS | Status: DC

## 2010-09-21 NOTE — Progress Notes (Signed)
Pt feels she was not correctly informed about conscious sedation and this was a horribly uncomfortable experience.  Stated, "I will never have this done again!  I was lied to."  Discussed conscious sedation in depth with pt and potential for deep sedation with next procedure advised in 10 years.  Pt not receptive.  Stated, "I woke up screaming during back surgery under anesthesia".  Unsure if general anesthesia or spinal for that surgery.  1610 Pt removed monitor herself.  Stated, "I'm getting out of here and I don't give a shit who gets in trouble for it".  Unable to change her mind.  9604 Pt attempted to walk out.  Staff able to convince her to ride in wheelchair for her safety.

## 2010-09-21 NOTE — Patient Instructions (Signed)
No polyps or cancers Internal hemorrhoids Otherwise normal Continue current colorectal screening recommendations for "routine risk" patients with a repeat colonoscopy in 10 years

## 2010-09-22 ENCOUNTER — Telehealth: Payer: Self-pay | Admitting: *Deleted

## 2010-09-22 NOTE — Telephone Encounter (Signed)
On f/u call back 0735, told pt. we were calling to check on her. Pt. Said " I appreciate your call". I then asked did you do O.K.? Pt. said  " I was very displeased " and hung up the phone. Recovery room nurse from yesterday said pt. had complained yesterday that she did not go to sleep during procedure. I was her procedure room nurse . Pt . did sleep during her procedure and she did awaken at intervals grimaced but was not extremely uncomfortable ,additional medication given accordingly and pt slept on way back out after cecum reached and woke up at end of procedure as scope retroflexed. Again, pt was pleasant in procedure room and expressed surprise that we were finished procedure when it was over, as if she did not remember all of it.

## 2010-09-30 ENCOUNTER — Other Ambulatory Visit: Payer: Self-pay | Admitting: Family Medicine

## 2010-10-20 ENCOUNTER — Other Ambulatory Visit: Payer: Self-pay | Admitting: Family Medicine

## 2010-11-09 ENCOUNTER — Other Ambulatory Visit: Payer: Self-pay | Admitting: Family Medicine

## 2010-11-14 ENCOUNTER — Other Ambulatory Visit: Payer: Self-pay | Admitting: Family Medicine

## 2012-06-11 ENCOUNTER — Other Ambulatory Visit: Payer: Self-pay | Admitting: Obstetrics & Gynecology

## 2012-06-11 DIAGNOSIS — Z1231 Encounter for screening mammogram for malignant neoplasm of breast: Secondary | ICD-10-CM

## 2012-06-11 LAB — HM PAP SMEAR: HM Pap smear: NEGATIVE

## 2012-06-19 ENCOUNTER — Other Ambulatory Visit: Payer: Self-pay | Admitting: Obstetrics & Gynecology

## 2012-06-19 DIAGNOSIS — N950 Postmenopausal bleeding: Secondary | ICD-10-CM

## 2012-06-28 ENCOUNTER — Encounter: Payer: Self-pay | Admitting: Certified Nurse Midwife

## 2012-07-01 ENCOUNTER — Other Ambulatory Visit: Payer: Self-pay

## 2012-07-01 ENCOUNTER — Telehealth: Payer: Self-pay | Admitting: Obstetrics & Gynecology

## 2012-07-01 ENCOUNTER — Institutional Professional Consult (permissible substitution): Payer: Self-pay | Admitting: Obstetrics & Gynecology
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
## 2012-07-11 ENCOUNTER — Ambulatory Visit
Admission: RE | Admit: 2012-07-11 | Discharge: 2012-07-11 | Disposition: A | Discharge status: Home / Self Care | Payer: BC Managed Care – PPO | Source: Ambulatory Visit | Attending: Obstetrics & Gynecology | Admitting: Obstetrics & Gynecology

## 2012-07-11 DIAGNOSIS — Z1231 Encounter for screening mammogram for malignant neoplasm of breast: Secondary | ICD-10-CM

## 2012-07-17 ENCOUNTER — Ambulatory Visit: Payer: BC Managed Care – PPO | Admitting: Obstetrics & Gynecology

## 2012-07-17 ENCOUNTER — Ambulatory Visit (INDEPENDENT_AMBULATORY_CARE_PROVIDER_SITE_OTHER): Payer: BC Managed Care – PPO

## 2012-07-17 ENCOUNTER — Other Ambulatory Visit: Payer: Self-pay | Admitting: Obstetrics & Gynecology

## 2012-07-17 VITALS — BP 130/80 | HR 78 | Resp 16 | Ht 65.0 in | Wt 214.0 lb

## 2012-07-17 DIAGNOSIS — N950 Postmenopausal bleeding: Secondary | ICD-10-CM

## 2012-07-17 MED ORDER — TEMAZEPAM 15 MG PO CAPS: 15.0000 mg | ORAL_CAPSULE | Freq: Every evening | ORAL | Status: DC | PRN

## 2012-07-17 MED ORDER — ESTRADIOL 0.1 MG/24HR TD PTTW: 1.0000 | MEDICATED_PATCH | TRANSDERMAL | Status: DC

## 2012-07-17 MED ORDER — PROGESTERONE MICRONIZED 200 MG PO CAPS: 200.0000 mg | ORAL_CAPSULE | Freq: Every day | ORAL | Status: DC

## 2012-07-17 NOTE — Progress Notes (Signed)
52 y.o.Marriedfemale here for a pelvic ultrasound with sonohystogram.  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  She has been receiving hormonal pellet implants from a physician in [place].  She has oscillated between wanting me to write for her HRT versus having the implants placed.  She knows I do not support the implants but she feels better on them.  For now, she would like me to make recommendations because she is having daily spotting and it is really starting to bother her.  She did have a neg pap with neg HR HPV 3/14.  Denies pelvic pain or urinary symptoms.  No vaginal odor or discharge.  Also wants to talk about sleep.  Using Ambien and getting 4-5 hours with it but she must be in bed before she takes it or she doesn't remember what she does.  Finds this worrisome to her.  Any other options?  Indication: PMP bleeding  No LMP recorded.  PMP.  Contraception: H/O BTL  Technique:  Both transabdominal and transvaginal ultrasound examinations of the pelvis were performed. Transabdominal technique was performed for global imaging of the pelvis including uterus, ovaries, adnexal regions, and pelvic cul-de-sac.  It was necessary to proceed with endovaginal exam following the abdominal ultrasound transabdominal exam to visualize the endometrium and adnexa. Color and duplex Doppler ultrasound was utilized to evaluate blood flow to the ovaries, if necessary.  Ultrasound report below. Significant findings:  Anteflexed normal sized uterus, atrophic and small ovaries.     SHSG:  After obtaining appropriate verbal consent from patient, the cervix was visualized using a speculum, and prepped with betadine.  A tenaculum  was applied to the cervix.  Dilation of the cervix was not necessary. The catheter was passed into the uterus and sterile saline introduced, with the following findings:  No intracavitary lesions and thin walls.  Endometrial biopsy recommended.  Discussed with patient.  Verbal and written  consent obtained.   Procedure:  Speculum replaced.  Cervix visualized and cleansed with betadine prep.  A single toothed tenaculum was applied to the anterior lip of the cervix.  Endometrial pipelle was advanced through the cervix into the endometrial cavity without difficulty.  Pipelle passed to 5.5 cm.  Suction applied and pipelle removed with good tissue sample obtained.  Tenculum removed.  No bleeding noted.  Patient tolerated procedure well.  Assessment:  PMP bleeding Insomnia with unacceptable symptoms on Ambien  Plan:  Endometrial biopsy pending For now, increase Prometrium to 200mg  QHS.  Rx sent to pharmacy Continue on Vivelle dot 0.1mg  patches.  She will not go for the implants at least for several months. Trial of Restoril 15mg  qhs #30/0RF.  Patient will give report over next month.  ~30 minutes spent with patient >50% of time was in face to face discussion of above.

## 2012-07-18 ENCOUNTER — Telehealth: Payer: Self-pay | Admitting: Obstetrics & Gynecology

## 2012-07-18 ENCOUNTER — Encounter: Payer: Self-pay | Admitting: Obstetrics & Gynecology

## 2012-07-18 NOTE — Patient Instructions (Signed)
We will call with biopsy result

## 2012-07-18 NOTE — Telephone Encounter (Signed)
Script should be one patch to skin twice weekly.  One month supple would be 8 patches.  3 month supply would be 24 patches.  RFs for one year appropriate.  Please call and clarify.

## 2012-07-18 NOTE — Telephone Encounter (Signed)
Vivelle patch/pharmacy calling to clarify rx is written correctly/usually is for once a week/rx is written for twice/[place]

## 2012-07-26 ENCOUNTER — Telehealth: Payer: Self-pay | Admitting: Obstetrics & Gynecology

## 2012-08-17 ENCOUNTER — Other Ambulatory Visit: Payer: Self-pay | Admitting: Obstetrics & Gynecology

## 2012-08-19 NOTE — Telephone Encounter (Signed)
eScribe request for refill on TEMAZEPAM Last filled - 07/17/12, #30 X 0 as trial Last OV - 07/17/12 Last AEX - 06/11/12 Next OV - 09/19/12 Please advise refills.  Thanks.

## 2012-08-19 NOTE — Telephone Encounter (Signed)
Please call patient and see how she is doing on this.  If well, I can refill for next five months.  Needs new RX every six months.

## 2012-08-20 ENCOUNTER — Other Ambulatory Visit: Payer: Self-pay | Admitting: Obstetrics & Gynecology

## 2012-08-20 ENCOUNTER — Telehealth: Payer: Self-pay | Admitting: Obstetrics & Gynecology

## 2012-08-20 NOTE — Telephone Encounter (Signed)
Patient called Amber Mata. Said she was told to call with prescriptions she wanted filled. Progesterone 200mg  and Temazepam 15 mg are the prescriptions she wants to discuss. Patient said she feels better on the Progesterone 200mg .

## 2012-08-20 NOTE — Telephone Encounter (Signed)
Spoke with patient, states tried the temazepam 15mg -sleep is better but still not sleeping 5 hours. Please advise if should continue on this until next appointment in 6/14 or change? If to continue will need this called in to [facility] [address].

## 2012-08-20 NOTE — Telephone Encounter (Signed)
She can increase to 30mg .  Try 2 capsules.  If works better, will prescribe the 30mg  dosage.

## 2012-08-21 NOTE — Telephone Encounter (Signed)
Ok to call in temazepam 15mg  qhs 1 to 2 nightly prn insomnia  #60/0rf.  Thanks.

## 2012-08-21 NOTE — Telephone Encounter (Signed)
Patient said she is completely out and if the refill can be sent in she will follow those instructions for dose and let you know how that works for her. Thanks so much.

## 2012-09-12 ENCOUNTER — Telehealth: Payer: Self-pay | Admitting: *Deleted

## 2012-09-12 NOTE — Telephone Encounter (Signed)
I have attempted to contact this patient by phone with the following results: unable to leave message due to full mailbox.  I will continue to try later.

## 2012-09-17 ENCOUNTER — Encounter: Payer: Self-pay | Admitting: *Deleted

## 2012-09-17 NOTE — Telephone Encounter (Signed)
Rescheduled to 10/01/12 at 3:15pm.

## 2012-09-19 ENCOUNTER — Institutional Professional Consult (permissible substitution): Payer: BC Managed Care – PPO | Admitting: Obstetrics & Gynecology

## 2012-09-26 ENCOUNTER — Telehealth: Payer: Self-pay | Admitting: Obstetrics & Gynecology

## 2012-09-26 NOTE — Telephone Encounter (Signed)
Pt wants a refill for Temazepam

## 2012-09-26 NOTE — Telephone Encounter (Signed)
Please advise if ok to call in refill. She does have appointment 10/01/12, but was given rx temazepam 15mg  1-2 q hs prn #60/0RF on 08/21/12.

## 2012-09-27 ENCOUNTER — Other Ambulatory Visit: Payer: Self-pay

## 2012-09-27 MED ORDER — TEMAZEPAM 15 MG PO CAPS: 15.0000 mg | ORAL_CAPSULE | Freq: Every evening | ORAL | Status: DC | PRN

## 2012-09-27 NOTE — Telephone Encounter (Signed)
Refills have been sent through for patient to [facility] [address].

## 2012-09-27 NOTE — Telephone Encounter (Signed)
Ok to rf with 30mg  #60/5 RF.

## 2012-09-27 NOTE — Telephone Encounter (Signed)
Patient states she will take one 15mg  tablet, then if she wakes up after 4 hours will take a second one. She does like having it in 15mg  tablets in case she does not need the second one.

## 2012-09-27 NOTE — Telephone Encounter (Signed)
Please check with her how she is taking.  If she is using two, will increase dosage to 30mg .

## 2012-10-01 ENCOUNTER — Encounter: Payer: Self-pay | Admitting: Obstetrics & Gynecology

## 2012-10-01 ENCOUNTER — Ambulatory Visit (INDEPENDENT_AMBULATORY_CARE_PROVIDER_SITE_OTHER): Payer: BC Managed Care – PPO | Admitting: Obstetrics & Gynecology

## 2012-10-01 VITALS — BP 136/80 | HR 60 | Resp 16 | Ht 65.25 in | Wt 212.0 lb

## 2012-10-01 DIAGNOSIS — N952 Postmenopausal atrophic vaginitis: Secondary | ICD-10-CM

## 2012-10-01 NOTE — Patient Instructions (Addendum)
We will call you with your lab results 

## 2012-10-01 NOTE — Progress Notes (Signed)
52 y.o. Married Caucasian female G0P0000 here for follow up after changing HRT.  Has noted her fingernails are stronger since changing the recent hormones.   Using 0.1 Vivelle dot and Prometrium daily.  She has spotted only twice.  This was very light and she thinks this was after "missing a patch".  She does feel like she is having increased hot flashes. No vaginal dryness.  Has had a worsening episode of fibromyalgia.  She is back on her Cymbalta 30mg  and that helps.   Using the Temazepam 15mg  at night.  Sometimes if she gets up, she will take a second pill and then she sleeps well.    Seeing Ena Dawley as PCP.  She reports at last visit she was recommended to do "DNA testing" to see what she needs in regards to vitamins and supplements.  They sell these products in their office.  This makes patient very wary.  She would like to change to a different provider.  O: Healthy WD,WN female Affect: normal  A: Vasomotor symptoms, on HRT Insomnia  P: Total testosterone level today.   She wants to stay on the Vivelle dot 0.1mg  and Prometrium 200mg . Has Rx for Temazepam 15mg  q day 1-2 tabs nigthly prn Will refer to [facility]   ~15 minutes spent with patient >50% of time was in face to face discussion of above.

## 2012-10-05 ENCOUNTER — Other Ambulatory Visit: Payer: Self-pay | Admitting: Obstetrics & Gynecology

## 2012-10-05 MED ORDER — NONFORMULARY OR COMPOUNDED ITEM: Status: DC

## 2012-10-07 ENCOUNTER — Telehealth: Payer: Self-pay

## 2012-10-07 NOTE — Telephone Encounter (Signed)
Message copied by Elisha Headland on Mon Oct 07, 2012 10:52 AM ------      Message from: Jerene Bears      Created: Sat Oct 05, 2012  9:09 PM       Inform level 31.  Nl between 10-70.  I have ordered RX--should have printed.  [facility]  I can sign it Mon when I get back from the OR.  Make sure pt know RX will not be at [facility] but [facility]. ------

## 2012-10-07 NOTE — Telephone Encounter (Signed)
7/7 lmtcb//kn 

## 2012-10-08 MED ORDER — NONFORMULARY OR COMPOUNDED ITEM: Status: DC

## 2012-10-08 NOTE — Telephone Encounter (Signed)
RX for Testosterone cream escribed to [facility].  RX reprinted to be signed by Dr. Hyacinth Meeker.  Fax to [facility] to disregard RX.

## 2012-10-08 NOTE — Addendum Note (Signed)
Addended by: Luisa Dago on: 10/08/2012 10:49 AM   Modules accepted: Orders

## 2012-10-08 NOTE — Telephone Encounter (Signed)
Patient notified of all results. 

## 2012-11-28 ENCOUNTER — Telehealth: Payer: Self-pay | Admitting: Obstetrics & Gynecology

## 2012-11-28 NOTE — Telephone Encounter (Signed)
Pt would to try the estrogen pills instead of the patches.

## 2012-11-29 MED ORDER — ESTRADIOL 1 MG PO TABS: 1.0000 mg | ORAL_TABLET | Freq: Every day | ORAL | Status: DC

## 2012-11-29 NOTE — Telephone Encounter (Signed)
Spoke with pt about patches. Pt is sweating the patches off because she works outside in her garden a lot. She thinks the dosage is working fine, as symptoms are doing better. She is just running out of patches too soon having to replace them on her skin too often. Advised her she may call for appt with Dr. Lenord Fellers at any time, as her info has been faxed to their office. Pt appreciative. OK to switch to pills?

## 2012-11-29 NOTE — Telephone Encounter (Signed)
Order entered for 1 year.  She should call if symptoms worsen on oral dosage.

## 2012-12-03 ENCOUNTER — Telehealth: Payer: Self-pay | Admitting: *Deleted

## 2012-12-03 NOTE — Telephone Encounter (Signed)
Fax From: [facility] Estradiol 1 mg was sent to [facility]  - " This is a commercially available product. Please send this to Amber Mata's retail Pharmacy."  Left Message To Call Back

## 2012-12-03 NOTE — Telephone Encounter (Signed)
LVM for pt about new Rx for oral ERT. Pt to call back with worsening of symptoms or any problems.

## 2012-12-05 MED ORDER — ESTRADIOL 1 MG PO TABS: 1.0000 mg | ORAL_TABLET | Freq: Every day | ORAL | Status: DC

## 2012-12-05 NOTE — Telephone Encounter (Signed)
S/w patient she said she wants her rx called into [facility] on [address], sent through Estrace 1 mg #30/12 rfs patient is aware.

## 2013-01-02 ENCOUNTER — Other Ambulatory Visit: Payer: BC Managed Care – PPO | Admitting: Internal Medicine

## 2013-01-03 ENCOUNTER — Encounter: Payer: BC Managed Care – PPO | Admitting: Internal Medicine

## 2013-01-28 ENCOUNTER — Other Ambulatory Visit: Payer: BC Managed Care – PPO | Admitting: Internal Medicine

## 2013-01-30 ENCOUNTER — Encounter: Payer: BC Managed Care – PPO | Admitting: Internal Medicine

## 2013-02-13 ENCOUNTER — Telehealth: Payer: Self-pay | Admitting: Obstetrics & Gynecology

## 2013-02-13 MED ORDER — VALSARTAN-HYDROCHLOROTHIAZIDE 160-12.5 MG PO TABS: 1.0000 | ORAL_TABLET | Freq: Every day | ORAL | Status: DC

## 2013-02-13 NOTE — Telephone Encounter (Signed)
[facility] for 30/1RF.  Please let pt know. Encounter closed.

## 2013-02-13 NOTE — Telephone Encounter (Signed)
Dr. Hyacinth Meeker, I spoke with patient and she is having trouble getting in touch with Dr. Mathews [facility] and has appointment with Dr. Lenord Fellers on 12/16. Patient is asking for refill of Diovan-hct just to get her until 12/16 appointment with Dr. Lenord Fellers. Are you agreeable to placing refill for one month?

## 2013-02-13 NOTE — Telephone Encounter (Signed)
[phone]  Patient wants to speak with nurse about a medicine she normally gets from her PCP. Patient is having trouble getting blood pressure medicine from PCP. Diovan is RX.

## 2013-02-14 NOTE — Telephone Encounter (Signed)
Message left to advise "The Prescription that you have requested was sent to your pharmacy if you have any questions,  return call to Piya Mesch at [phone]."  

## 2013-03-06 ENCOUNTER — Other Ambulatory Visit: Payer: BC Managed Care – PPO | Admitting: Internal Medicine

## 2013-03-06 DIAGNOSIS — E109 Type 1 diabetes mellitus without complications: Secondary | ICD-10-CM

## 2013-03-06 DIAGNOSIS — I10 Essential (primary) hypertension: Secondary | ICD-10-CM

## 2013-03-06 DIAGNOSIS — K7689 Other specified diseases of liver: Secondary | ICD-10-CM

## 2013-03-06 DIAGNOSIS — Z130 Encounter for screening for diseases of the blood and blood-forming organs and certain disorders involving the immune mechanism: Secondary | ICD-10-CM

## 2013-03-06 DIAGNOSIS — Z1329 Encounter for screening for other suspected endocrine disorder: Secondary | ICD-10-CM

## 2013-03-06 LAB — COMPREHENSIVE METABOLIC PANEL
AST: 16 U/L (ref 0–37)
Albumin: 3.9 g/dL (ref 3.5–5.2)
Alkaline Phosphatase: 69 U/L (ref 39–117)
BUN: 7 mg/dL (ref 6–23)
Creat: 0.67 mg/dL (ref 0.50–1.10)
Potassium: 4 mEq/L (ref 3.5–5.3)
Total Bilirubin: 0.4 mg/dL (ref 0.3–1.2)

## 2013-03-06 LAB — CBC WITH DIFFERENTIAL/PLATELET
Basophils Absolute: 0.1 10*3/uL (ref 0.0–0.1)
Basophils Relative: 1 % (ref 0–1)
Lymphocytes Relative: 25 % (ref 12–46)
Neutro Abs: 3.4 10*3/uL (ref 1.7–7.7)
Neutrophils Relative %: 58 % (ref 43–77)
Platelets: 273 10*3/uL (ref 150–400)
RDW: 17.7 % — ABNORMAL HIGH (ref 11.5–15.5)
WBC: 6 10*3/uL (ref 4.0–10.5)

## 2013-03-06 LAB — LIPID PANEL
HDL: 39 mg/dL — ABNORMAL LOW (ref >39)
LDL Cholesterol: 67 mg/dL (ref 0–99)
Total CHOL/HDL Ratio: 3.2 Ratio
VLDL: 17 mg/dL (ref 0–40)

## 2013-03-06 LAB — HEMOGLOBIN A1C: Mean Plasma Glucose: 128 mg/dL — ABNORMAL HIGH (ref <117)

## 2013-03-06 NOTE — Addendum Note (Signed)
Addended by: Vanetta Shawl R on: 03/06/2013 09:42 AM   Modules accepted: Orders

## 2013-03-10 ENCOUNTER — Telehealth: Payer: Self-pay | Admitting: Obstetrics & Gynecology

## 2013-03-10 NOTE — Telephone Encounter (Signed)
Dose: 200 mg Route: Oral Frequency: Daily  Dispense Quantity: 30 capsule Refills: 12 Fills Remaining: 12        Sig: Take 1 capsule (200 mg total) by mouth daily.       Written Date: 07/17/12 Expiration Date: 07/17/13    Start Date: 07/17/12 End Date: --    Ordering Provider: -- Authorizing Provider: Annamaria Boots, MD Ordering User: Annamaria Boots, MD              Pharmacy: [id number] - , [place] - [address] AT [facility] [address]     Pharmacy Comments: --       Quantity Remaining: 360 capsule Quantity Filled: 0 capsule

## 2013-03-10 NOTE — Telephone Encounter (Signed)
Patient calling, would like to know where we sent testosterone. Advised that it was sent to [facility] back in July. Patient will call back if they do not have rx.  She would like Dr. Hyacinth Meeker to know that she really loves Dr. Lenord Fellers and wants to say thank you!

## 2013-03-10 NOTE — Telephone Encounter (Signed)
Pt does not know which pharmacy her progesterone was sent to. Would like nurse to call.

## 2013-03-18 ENCOUNTER — Encounter: Payer: Self-pay | Admitting: Internal Medicine

## 2013-03-18 ENCOUNTER — Ambulatory Visit (INDEPENDENT_AMBULATORY_CARE_PROVIDER_SITE_OTHER): Payer: BC Managed Care – PPO | Admitting: Internal Medicine

## 2013-03-18 VITALS — BP 140/94 | HR 92 | Temp 99.6°F | Ht 65.0 in | Wt 201.0 lb

## 2013-03-18 DIAGNOSIS — E119 Type 2 diabetes mellitus without complications: Secondary | ICD-10-CM

## 2013-03-18 DIAGNOSIS — E109 Type 1 diabetes mellitus without complications: Secondary | ICD-10-CM

## 2013-03-18 DIAGNOSIS — F329 Major depressive disorder, single episode, unspecified: Secondary | ICD-10-CM

## 2013-03-18 DIAGNOSIS — G4700 Insomnia, unspecified: Secondary | ICD-10-CM

## 2013-03-18 DIAGNOSIS — Z0000 Encounter for general adult medical examination without abnormal findings: Secondary | ICD-10-CM

## 2013-03-18 DIAGNOSIS — IMO0001 Reserved for inherently not codable concepts without codable children: Secondary | ICD-10-CM

## 2013-03-18 DIAGNOSIS — Z9884 Bariatric surgery status: Secondary | ICD-10-CM

## 2013-03-18 DIAGNOSIS — I10 Essential (primary) hypertension: Secondary | ICD-10-CM

## 2013-03-18 DIAGNOSIS — F32A Depression, unspecified: Secondary | ICD-10-CM

## 2013-03-18 DIAGNOSIS — M797 Fibromyalgia: Secondary | ICD-10-CM

## 2013-03-18 DIAGNOSIS — M25559 Pain in unspecified hip: Secondary | ICD-10-CM

## 2013-03-18 MED ORDER — TRAMADOL HCL 50 MG PO TABS: 50.0000 mg | ORAL_TABLET | Freq: Three times a day (TID) | ORAL | Status: DC

## 2013-03-18 MED ORDER — VALSARTAN-HYDROCHLOROTHIAZIDE 160-12.5 MG PO TABS: 1.0000 | ORAL_TABLET | Freq: Every day | ORAL | Status: DC

## 2013-03-18 MED ORDER — METFORMIN HCL 500 MG PO TABS: ORAL_TABLET | ORAL | Status: DC

## 2013-03-18 MED ORDER — DULOXETINE HCL 30 MG PO CPEP: ORAL_CAPSULE | ORAL | Status: DC

## 2013-03-19 LAB — MICROALBUMIN, URINE: Microalb, Ur: 0.5 mg/dL (ref 0.00–1.89)

## 2013-03-20 ENCOUNTER — Other Ambulatory Visit: Payer: Self-pay | Admitting: Internal Medicine

## 2013-03-20 ENCOUNTER — Other Ambulatory Visit: Payer: BC Managed Care – PPO | Admitting: Internal Medicine

## 2013-03-20 DIAGNOSIS — R252 Cramp and spasm: Secondary | ICD-10-CM

## 2013-03-20 LAB — IRON AND TIBC
Iron: 47 ug/dL (ref 42–145)
UIBC: 473 ug/dL — ABNORMAL HIGH (ref 125–400)

## 2013-03-20 LAB — MAGNESIUM: Magnesium: 1.7 mg/dL (ref 1.5–2.5)

## 2013-03-24 ENCOUNTER — Telehealth: Payer: Self-pay | Admitting: Internal Medicine

## 2013-03-24 MED ORDER — EPINEPHRINE 0.3 MG/0.3ML IJ SOAJ: 0.3000 mg | Freq: Once | INTRAMUSCULAR | Status: AC

## 2013-03-24 NOTE — Telephone Encounter (Signed)
Patient requests EpiPen refill for bee sting allergy. Ordered new prescription for 2 EpiPen  devices with when necessary 1 year refill to [facility] pharmacy

## 2013-03-25 ENCOUNTER — Other Ambulatory Visit: Payer: Self-pay | Admitting: Internal Medicine

## 2013-03-25 DIAGNOSIS — M8580 Other specified disorders of bone density and structure, unspecified site: Secondary | ICD-10-CM

## 2013-04-08 ENCOUNTER — Telehealth: Payer: Self-pay

## 2013-04-08 NOTE — Telephone Encounter (Signed)
Patient states she has been having chest pain almost daily, for the past 2 weeks. Not associated with exercise. Denies sob, dyspnea lightheadedness, nausea or radiating pain. Husband is a Agricultural consultant, and doesn't appear concerned with her symptoms.

## 2013-04-08 NOTE — Telephone Encounter (Signed)
Advise OV tomorrow.

## 2013-04-09 ENCOUNTER — Ambulatory Visit (INDEPENDENT_AMBULATORY_CARE_PROVIDER_SITE_OTHER): Payer: BC Managed Care – PPO | Admitting: Internal Medicine

## 2013-04-09 ENCOUNTER — Encounter: Payer: Self-pay | Admitting: Internal Medicine

## 2013-04-09 VITALS — BP 126/78 | HR 88 | Temp 99.4°F | Wt 204.0 lb

## 2013-04-09 DIAGNOSIS — E119 Type 2 diabetes mellitus without complications: Secondary | ICD-10-CM

## 2013-04-09 DIAGNOSIS — R079 Chest pain, unspecified: Secondary | ICD-10-CM

## 2013-04-09 NOTE — Telephone Encounter (Signed)
scheduled

## 2013-04-18 ENCOUNTER — Telehealth: Payer: Self-pay

## 2013-04-18 NOTE — Telephone Encounter (Signed)
Patient calls this am stating she has been in [facility] in [place] for the past few days with chest pain and abnormal lab functions. They wanted her to stay and have a stress test done but she declined. Knows about her appointment with Dr. Frederico Hamman on 04/28/2013. Asked her to have records forwarded here.

## 2013-04-18 NOTE — Telephone Encounter (Signed)
Spoke with patient. She will come in on Monday 04/21/2013 to sign a medical record release.

## 2013-04-18 NOTE — Telephone Encounter (Signed)
Need records to forward to Dr. Frederico Hamman.

## 2013-04-25 ENCOUNTER — Other Ambulatory Visit: Payer: BC Managed Care – PPO

## 2013-04-27 ENCOUNTER — Encounter: Payer: Self-pay | Admitting: Internal Medicine

## 2013-04-27 DIAGNOSIS — E119 Type 2 diabetes mellitus without complications: Secondary | ICD-10-CM | POA: Insufficient documentation

## 2013-04-27 NOTE — Progress Notes (Signed)
   Subjective:    Patient ID: Amber Mata, female    DOB: 1960-09-10, 53 y.o.   MRN: [id number]  HPI  53 year old White female new to this practice as of 03/18/2013 presents today with a complaint of chest pain. Has a history of type 2 diabetes mellitus controlled with metformin. History of hypertension. Family history of MI in father. Mother with history of stroke. Patient has history of hypertension and fibromyalgia syndrome in addition to depression and insomnia. She had a "mini "gastric bypass surgery in [place] in 2007.  Has had substernal chest pain new onset recently. Not associated with diaphoresis nausea or vomiting. Pain occurs at rest. No radiation into neck or down arm. Patient says she's not previously had history of chest pain.    Review of Systems see above     Objective:   Physical Exam skin is warm and dry. Nodes none. Chest is clear.dictation without rales or wheezing. Cardiac exam regular rate and rhythm normal S1 and S2 without murmurs or gallops. Abdomen no hepatosplenomegaly masses or tenderness. Extremities without edema.        Assessment & Plan:  New onset substernal chest pain at rest. EKG shows no acute changes. However she has risk factors in terms of family history and type 2 diabetes mellitus.  Plan: Cardiology evaluation

## 2013-04-27 NOTE — Progress Notes (Signed)
Subjective:    Patient ID: Amber Mata, female    DOB: 05-14-1960, 53 y.o.   MRN: [id number]  HPI  53 year old White female presents for the first time to this office having previously been seen by Dr. Heath Gold. Dr. Edwinna Areola is GYN physician who referred her here.  Past medical history: Patient had repair of torn left meniscus in 2000 by Dr. Rolena Infante in [facility]. Abdominoplasty done in [facility] 2009. Tonsillectomy at age 29. What she describes as a mini gastric bypass operation done in [place] in 2007. No records about this procedure available.  She is allergic to morphine he causes times.  Has never been pregnant.  History of glucose intolerance treated with metformin. History of insomnia. History of fibromyalgia-type musculoskeletal pain. History of depression. History of estrogen replacement. History of hypertension.  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 Received undergraduate degree from [employer]. Husband is employed by [employer]. Patient smokes a half pack per day. Consumes alcohol socially.  Patient had colonoscopy by Dr. Sharlett Iles June 2012 with 10 year followup recommended. She was found to have internal hemorrhoids otherwise normal study.  Family history: Father died at age 62 of an embolism. Mother age 49 with history of stroke. One brother age 22 with history of kidney stones. No sisters. Father had history of diabetes, arthritis, gout, MI, hypertension and kidney stones.  Patient exercises by walking and working in her yard.    Review of Systems  HENT: Negative.   Eyes: Negative.   Respiratory: Negative.   Cardiovascular: Negative.   Gastrointestinal:       History of internal hemorrhoids on colonoscopy  Endocrine: Negative.   Musculoskeletal: Positive for arthralgias.       Muscle cramps in her thighs.    Allergic/Immunologic: Negative.   Hematological: Negative.   Psychiatric/Behavioral:       History of depression and fibromyalgia treated with Cymbalta       Objective:   Physical Exam  Vitals reviewed. Constitutional: She is oriented to person, place, and time. She appears well-developed and well-nourished. No distress.  HENT:  Head: Normocephalic and atraumatic.  Right Ear: External ear normal.  Left Ear: External ear normal.  Mouth/Throat: Oropharynx is clear and moist. No oropharyngeal exudate.  Eyes: Conjunctivae and EOM are normal. Pupils are equal, round, and reactive to light. Right eye exhibits no discharge. Left eye exhibits no discharge. No scleral icterus.  Neck: Neck supple. No JVD present. No thyromegaly present.  Cardiovascular: Normal rate, regular rhythm and normal heart sounds.   No murmur heard. Pulmonary/Chest: Effort normal and breath sounds normal. No respiratory distress. She has no wheezes. She has no rales.  Abdominal: Soft. Bowel sounds are normal. She exhibits no distension and no mass. There is no tenderness. There is no rebound and no guarding.  Genitourinary:  Deferred to Dr. Edwinna Areola  Musculoskeletal: Normal range of motion. She exhibits no edema.  Tenderness in trapezius muscles bilaterally  Lymphadenopathy:    She has no cervical adenopathy.  Neurological: She is alert and oriented to person, place, and time. She has normal reflexes. No cranial nerve deficit. Coordination normal.  Skin: Skin is warm and dry. No rash noted. She is not diaphoretic.  Psychiatric: She has a normal mood and affect. Her behavior is normal. Judgment and thought content normal.          Assessment &  Plan:  Musculoskeletal pain in her thighs and leg cramps-check B12, iron iron-binding capacity and magnesium  History of mini gastric bypass surgery 2007  History of fibromyalgia syndrome  History of glucose intolerance  History of depression  History of  insomnia  History of estrogen replacement  Hypertension  Plan: Return in 6 months or as needed. Is to have bone density study.

## 2013-04-27 NOTE — Patient Instructions (Signed)
And will check B12, iron, iron-binding capacity and magnesium regarding back pain. Otherwise return in 6 months or as needed. Continue same medications

## 2013-04-27 NOTE — Patient Instructions (Signed)
Advise Cardiology evaluation.

## 2013-04-29 ENCOUNTER — Other Ambulatory Visit: Payer: Self-pay | Admitting: Obstetrics & Gynecology

## 2013-04-29 NOTE — Telephone Encounter (Signed)
Last AEX 06/11/2012 Last refill 09/27/2012 #60/5 R  Next Appt 09/04/2013.  Please Advise.

## 2013-05-01 ENCOUNTER — Other Ambulatory Visit: Payer: Self-pay | Admitting: Obstetrics & Gynecology

## 2013-05-01 NOTE — Telephone Encounter (Signed)
Medication was approved 04/29/2013 by Dr. Sabra Heck. Called pharmacy and they don't have it on file. Rx sent again electronically.

## 2013-05-21 ENCOUNTER — Other Ambulatory Visit: Payer: Self-pay | Admitting: Internal Medicine

## 2013-05-22 ENCOUNTER — Other Ambulatory Visit: Payer: Self-pay | Admitting: Internal Medicine

## 2013-05-22 ENCOUNTER — Other Ambulatory Visit: Payer: Self-pay

## 2013-05-22 MED ORDER — TRAMADOL HCL 50 MG PO TABS: 50.0000 mg | ORAL_TABLET | Freq: Three times a day (TID) | ORAL | Status: DC | PRN

## 2013-05-22 NOTE — Telephone Encounter (Signed)
Have refilled should have printed up

## 2013-08-15 ENCOUNTER — Other Ambulatory Visit: Payer: Self-pay | Admitting: Obstetrics & Gynecology

## 2013-08-15 NOTE — Telephone Encounter (Signed)
Last AEX 06/11/12 Last refill 07/17/12 #30/2 refills Next appt 09/04/13  Rx sent for 1 month. - Patient will need AEX for further refills.

## 2013-08-19 ENCOUNTER — Other Ambulatory Visit: Payer: Self-pay | Admitting: Obstetrics & Gynecology

## 2013-08-20 NOTE — Telephone Encounter (Signed)
Last AEX 06/2012 Last refill 07/17/12 #8/ 12 refills Next appt 09/04/13 MMG 07/12/12 - BI-RADS 1: Neg.  Rx sent for 1 month until appt.

## 2013-08-21 ENCOUNTER — Ambulatory Visit (INDEPENDENT_AMBULATORY_CARE_PROVIDER_SITE_OTHER): Payer: BC Managed Care – PPO | Admitting: Internal Medicine

## 2013-08-21 ENCOUNTER — Encounter: Payer: Self-pay | Admitting: Internal Medicine

## 2013-08-21 VITALS — BP 158/92 | HR 88 | Wt 209.0 lb

## 2013-08-21 DIAGNOSIS — F3289 Other specified depressive episodes: Secondary | ICD-10-CM

## 2013-08-21 DIAGNOSIS — F32A Depression, unspecified: Secondary | ICD-10-CM

## 2013-08-21 DIAGNOSIS — F329 Major depressive disorder, single episode, unspecified: Secondary | ICD-10-CM

## 2013-08-21 MED ORDER — ESCITALOPRAM OXALATE 10 MG PO TABS: 10.0000 mg | ORAL_TABLET | Freq: Every day | ORAL | Status: DC

## 2013-08-21 NOTE — Patient Instructions (Signed)
Start Lexapro 10 mg daily and call with progress report in 3 weeks.
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
## 2013-09-03 ENCOUNTER — Ambulatory Visit: Payer: Self-pay | Admitting: Obstetrics & Gynecology

## 2013-09-04 ENCOUNTER — Ambulatory Visit (INDEPENDENT_AMBULATORY_CARE_PROVIDER_SITE_OTHER): Payer: BC Managed Care – PPO | Admitting: Obstetrics & Gynecology

## 2013-09-04 ENCOUNTER — Encounter: Payer: Self-pay | Admitting: Obstetrics & Gynecology

## 2013-09-04 VITALS — BP 140/82 | HR 64 | Resp 16 | Ht 65.25 in | Wt 206.0 lb

## 2013-09-04 DIAGNOSIS — Z124 Encounter for screening for malignant neoplasm of cervix: Secondary | ICD-10-CM

## 2013-09-04 DIAGNOSIS — N9489 Other specified conditions associated with female genital organs and menstrual cycle: Secondary | ICD-10-CM

## 2013-09-04 DIAGNOSIS — Z01419 Encounter for gynecological examination (general) (routine) without abnormal findings: Secondary | ICD-10-CM

## 2013-09-04 DIAGNOSIS — Z1239 Encounter for other screening for malignant neoplasm of breast: Secondary | ICD-10-CM

## 2013-09-04 DIAGNOSIS — Z0000 Encounter for general adult medical examination without abnormal findings: Secondary | ICD-10-CM

## 2013-09-04 DIAGNOSIS — E2839 Other primary ovarian failure: Secondary | ICD-10-CM

## 2013-09-04 LAB — POCT URINALYSIS DIPSTICK
Bilirubin, UA: NEGATIVE
Blood, UA: NEGATIVE
Glucose, UA: NEGATIVE
KETONES UA: NEGATIVE
Leukocytes, UA: NEGATIVE
Nitrite, UA: NEGATIVE
PH UA: 5
Protein, UA: NEGATIVE
Urobilinogen, UA: NEGATIVE

## 2013-09-04 MED ORDER — TRAMADOL HCL 50 MG PO TABS: ORAL_TABLET | ORAL | Status: DC

## 2013-09-04 MED ORDER — TEMAZEPAM 15 MG PO CAPS: ORAL_CAPSULE | ORAL | Status: DC

## 2013-09-04 MED ORDER — PROGESTERONE MICRONIZED 200 MG PO CAPS: ORAL_CAPSULE | ORAL | Status: DC

## 2013-09-04 MED ORDER — NONFORMULARY OR COMPOUNDED ITEM: Status: DC

## 2013-09-04 MED ORDER — ESTRADIOL 0.1 MG/24HR TD PTTW: MEDICATED_PATCH | TRANSDERMAL | Status: DC

## 2013-09-04 NOTE — Patient Instructions (Signed)
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
## 2013-09-10 LAB — IPS PAP SMEAR ONLY

## 2013-09-15 ENCOUNTER — Other Ambulatory Visit: Payer: BC Managed Care – PPO

## 2013-09-15 ENCOUNTER — Ambulatory Visit: Payer: BC Managed Care – PPO

## 2013-09-22 ENCOUNTER — Telehealth: Payer: Self-pay

## 2013-09-22 NOTE — Telephone Encounter (Signed)
Message copied by Robley Fries on Mon Sep 22, 2013 10:08 AM ------      Message from: Megan Salon      Created: Fri Sep 12, 2013 11:33 PM      Regarding: labs       Claiborne Billings,      Mrs. Neaves was to have a ca-125 drawn the day she was in the office.  I put in the order but there is no result so I think she left before having it done.  Can you call and find out?            MSM      ----- Message -----         From: SYSTEM         Sent: 09/09/2013  12:00 AM           To: Lyman Speller, MD                   ------

## 2013-09-22 NOTE — Telephone Encounter (Signed)
Left detailed message on cell#-ok per DPR//kn

## 2013-10-06 MED ORDER — TRAMADOL HCL 50 MG PO TABS: ORAL_TABLET | ORAL | Status: DC

## 2013-10-06 MED ORDER — TEMAZEPAM 15 MG PO CAPS: ORAL_CAPSULE | ORAL | Status: DC
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
## 2013-10-07 ENCOUNTER — Other Ambulatory Visit: Payer: Self-pay

## 2013-10-07 ENCOUNTER — Telehealth: Payer: Self-pay

## 2013-10-07 MED ORDER — METFORMIN HCL 500 MG PO TABS: ORAL_TABLET | ORAL | Status: DC

## 2013-10-23 ENCOUNTER — Other Ambulatory Visit: Payer: BC Managed Care – PPO | Admitting: Internal Medicine

## 2013-10-23 DIAGNOSIS — E119 Type 2 diabetes mellitus without complications: Secondary | ICD-10-CM

## 2013-10-23 DIAGNOSIS — Z8249 Family history of ischemic heart disease and other diseases of the circulatory system: Secondary | ICD-10-CM

## 2013-10-23 LAB — LIPID PANEL
CHOL/HDL RATIO: 3.3 ratio
Cholesterol: 109 mg/dL (ref 0–200)
HDL: 33 mg/dL — ABNORMAL LOW (ref >39)
LDL Cholesterol: 52 mg/dL (ref 0–99)
Triglycerides: 118 mg/dL (ref <150)
VLDL: 24 mg/dL (ref 0–40)

## 2013-10-23 LAB — HEMOGLOBIN A1C
HEMOGLOBIN A1C: 6.3 % — AB (ref <5.7)
MEAN PLASMA GLUCOSE: 134 mg/dL — AB (ref <117)

## 2013-10-24 ENCOUNTER — Encounter: Payer: Self-pay | Admitting: Internal Medicine

## 2013-10-24 ENCOUNTER — Ambulatory Visit (INDEPENDENT_AMBULATORY_CARE_PROVIDER_SITE_OTHER): Payer: BC Managed Care – PPO | Admitting: Internal Medicine

## 2013-10-24 ENCOUNTER — Ambulatory Visit
Admission: RE | Admit: 2013-10-24 | Discharge: 2013-10-24 | Disposition: A | Discharge status: Home / Self Care | Payer: BC Managed Care – PPO | Source: Ambulatory Visit | Attending: Internal Medicine | Admitting: Internal Medicine

## 2013-10-24 VITALS — BP 138/82 | HR 76 | Temp 99.0°F | Wt 211.0 lb

## 2013-10-24 DIAGNOSIS — S6980XA Other specified injuries of unspecified wrist, hand and finger(s), initial encounter: Secondary | ICD-10-CM

## 2013-10-24 DIAGNOSIS — S62609A Fracture of unspecified phalanx of unspecified finger, initial encounter for closed fracture: Secondary | ICD-10-CM

## 2013-10-24 DIAGNOSIS — S6991XA Unspecified injury of right wrist, hand and finger(s), initial encounter: Secondary | ICD-10-CM

## 2013-10-24 DIAGNOSIS — F32A Depression, unspecified: Secondary | ICD-10-CM

## 2013-10-24 DIAGNOSIS — F3289 Other specified depressive episodes: Secondary | ICD-10-CM

## 2013-10-24 DIAGNOSIS — I10 Essential (primary) hypertension: Secondary | ICD-10-CM

## 2013-10-24 DIAGNOSIS — S62639A Displaced fracture of distal phalanx of unspecified finger, initial encounter for closed fracture: Secondary | ICD-10-CM

## 2013-10-24 DIAGNOSIS — F329 Major depressive disorder, single episode, unspecified: Secondary | ICD-10-CM

## 2013-10-24 DIAGNOSIS — S6990XA Unspecified injury of unspecified wrist, hand and finger(s), initial encounter: Secondary | ICD-10-CM

## 2013-10-24 DIAGNOSIS — E119 Type 2 diabetes mellitus without complications: Secondary | ICD-10-CM

## 2013-10-24 MED ORDER — MELOXICAM 15 MG PO TABS
15.0000 mg | ORAL_TABLET | Freq: Every day | ORAL | Status: DC
Start: 2013-10-24 — End: 2014-03-03

## 2013-10-24 MED ORDER — METFORMIN HCL 500 MG PO TABS: ORAL_TABLET | ORAL | Status: DC

## 2013-10-24 NOTE — Patient Instructions (Signed)
Refer to hand surgeon regarding finger fracture. Return in 6 months for complete physical exam and fasting fasting lab work. Work on diet exercise and weight loss.
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
## 2013-10-27 ENCOUNTER — Telehealth: Payer: Self-pay

## 2013-10-27 NOTE — Telephone Encounter (Signed)
Patient has been scheduled for an appointment with Dr. Burney Gauze on Tuesday 10/28/2013 at 9:40 am. She is aware of this and will need to pick up her x-ray films.

## 2013-12-22 ENCOUNTER — Other Ambulatory Visit: Payer: Self-pay

## 2013-12-22 MED ORDER — DULOXETINE HCL 30 MG PO CPEP: ORAL_CAPSULE | ORAL | Status: DC

## 2013-12-23 ENCOUNTER — Ambulatory Visit (INDEPENDENT_AMBULATORY_CARE_PROVIDER_SITE_OTHER): Payer: BC Managed Care – PPO | Admitting: Internal Medicine

## 2013-12-23 ENCOUNTER — Encounter: Payer: Self-pay | Admitting: Internal Medicine

## 2013-12-23 VITALS — BP 138/82 | HR 98 | Ht 65.25 in | Wt 213.0 lb

## 2013-12-23 DIAGNOSIS — Z8659 Personal history of other mental and behavioral disorders: Secondary | ICD-10-CM

## 2013-12-23 DIAGNOSIS — Z23 Encounter for immunization: Secondary | ICD-10-CM

## 2013-12-23 DIAGNOSIS — R8290 Unspecified abnormal findings in urine: Secondary | ICD-10-CM

## 2013-12-23 DIAGNOSIS — M545 Low back pain, unspecified: Secondary | ICD-10-CM

## 2013-12-23 DIAGNOSIS — R82998 Other abnormal findings in urine: Secondary | ICD-10-CM

## 2013-12-23 LAB — POCT URINALYSIS DIPSTICK
Bilirubin, UA: NEGATIVE
Glucose, UA: NEGATIVE
Ketones, UA: NEGATIVE
NITRITE UA: NEGATIVE
PROTEIN UA: NEGATIVE
RBC UA: NEGATIVE
Spec Grav, UA: <=1.005
Urobilinogen, UA: NEGATIVE
pH, UA: 7

## 2013-12-23 MED ORDER — ESCITALOPRAM OXALATE 10 MG PO TABS: 10.0000 mg | ORAL_TABLET | Freq: Every day | ORAL | Status: DC

## 2013-12-23 NOTE — Patient Instructions (Signed)
Refill Lexapro . Take muscle relaxant. Wait on urine culture.

## 2013-12-26 ENCOUNTER — Telehealth: Payer: Self-pay

## 2013-12-26 LAB — URINE CULTURE: Colony Count: 30000

## 2013-12-26 MED ORDER — CIPROFLOXACIN HCL 250 MG PO TABS: 250.0000 mg | ORAL_TABLET | Freq: Two times a day (BID) | ORAL | Status: DC

## 2013-12-26 NOTE — Telephone Encounter (Signed)
Message copied by Amado Coe on Fri Dec 26, 2013 10:36 AM ------      Message from: Elby Showers      Created: Fri Dec 26, 2013 10:27 AM       Patient is somewhat symptomatic. Although this is less than than 100,000 col/ml go ahead and treat with Cipro 250 mg bid x 7 days please. ------

## 2013-12-26 NOTE — Telephone Encounter (Signed)
Left message informing patient of antibiotic and urine results.  Cipro sent to pharmacy.

## 2013-12-30 ENCOUNTER — Telehealth: Payer: Self-pay | Admitting: Internal Medicine

## 2013-12-30 NOTE — Telephone Encounter (Signed)
Please cal in Flexeril 10 mg #30 one po qhs. With no refill

## 2013-12-30 NOTE — Telephone Encounter (Signed)
Patient states you asked if she needed a muscle relaxer when she was last here.  She thought she had some.  She only had 5 tablets.  And, she does feel she needs a muscle relaxer.  What she had was Carisoprodol  (Soma) 250 mg. Take 1 tablet by mouth after each meal and at bedtime.  So, she wants to know if you will refill that for her or prescribe a muscle relaxer since you offered in the office on that day when she was here.   Pharmacy:  [facility] at [place].    Thanks!

## 2013-12-31 MED ORDER — CYCLOBENZAPRINE HCL 10 MG PO TABS: 10.0000 mg | ORAL_TABLET | Freq: Every day | ORAL | Status: DC

## 2013-12-31 NOTE — Telephone Encounter (Signed)
Flexeril sent to [place].  Patient aware.

## 2014-01-12 ENCOUNTER — Telehealth: Payer: Self-pay

## 2014-01-12 NOTE — Telephone Encounter (Signed)
Patient is taking the flexeril tid.  She says because she is missing some of her intestines she absorbs the medication different.  She is having trouble with back pain.  Since patient is not taking medication as directed she will make an appointment to be seen.  Per Dr Renold Genta she said patient could take flexeril 5mg  qhs and see if that helps but since she is non compliant she will be seen.

## 2014-01-12 NOTE — Telephone Encounter (Signed)
Patient called requesting medication change.  She would like a lower dose of flexeril so she can take it more often.  She is having trouble with her intestinal pain.  The flexeril helps for about 4 hours but no more than that.  Patient would like to know what else she can do.  Please advise.

## 2014-01-12 NOTE — Telephone Encounter (Signed)
Decrease Flexeril to one half tab at bedtime. Consider PT for back pain.

## 2014-01-13 ENCOUNTER — Telehealth: Payer: Self-pay | Admitting: Internal Medicine

## 2014-01-13 ENCOUNTER — Ambulatory Visit: Payer: BC Managed Care – PPO | Admitting: Internal Medicine

## 2014-01-13 NOTE — Telephone Encounter (Signed)
Patient called at 12:35 to advise that her husband had an emergency come up and he had to take "her car" and go to [facility] so she has no way to get to her appointment today.  Offered to R/S and patient advised she didn't know about her schedule yet.  So, she'll call us back to R/S at another time.

## 2014-01-22 ENCOUNTER — Ambulatory Visit: Payer: BC Managed Care – PPO | Admitting: Internal Medicine

## 2014-01-23 ENCOUNTER — Ambulatory Visit: Payer: BC Managed Care – PPO | Admitting: Internal Medicine

## 2014-01-23 ENCOUNTER — Telehealth: Payer: Self-pay | Admitting: Internal Medicine

## 2014-01-23 NOTE — Telephone Encounter (Signed)
Agree 
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
## 2014-01-26 ENCOUNTER — Other Ambulatory Visit: Payer: Self-pay
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
## 2014-03-03 ENCOUNTER — Ambulatory Visit (INDEPENDENT_AMBULATORY_CARE_PROVIDER_SITE_OTHER): Payer: BC Managed Care – PPO | Admitting: Internal Medicine

## 2014-03-03 ENCOUNTER — Telehealth: Payer: Self-pay | Admitting: Internal Medicine

## 2014-03-03 ENCOUNTER — Emergency Department (HOSPITAL_COMMUNITY): Payer: BC Managed Care – PPO

## 2014-03-03 ENCOUNTER — Encounter: Payer: Self-pay | Admitting: Internal Medicine

## 2014-03-03 ENCOUNTER — Other Ambulatory Visit: Payer: Self-pay | Admitting: Internal Medicine

## 2014-03-03 ENCOUNTER — Inpatient Hospital Stay (HOSPITAL_COMMUNITY)
Admission: EM | Admit: 2014-03-03 | Discharge: 2014-03-05 | DRG: 917 | Disposition: A | Discharge status: Home / Self Care | Payer: BC Managed Care – PPO | Attending: Internal Medicine | Admitting: Internal Medicine

## 2014-03-03 ENCOUNTER — Encounter (HOSPITAL_COMMUNITY): Payer: Self-pay | Admitting: *Deleted

## 2014-03-03 VITALS — BP 150/100 | HR 86 | Temp 98.6°F | Ht 65.25 in | Wt 219.0 lb

## 2014-03-03 DIAGNOSIS — H409 Unspecified glaucoma: Secondary | ICD-10-CM | POA: Diagnosis present

## 2014-03-03 DIAGNOSIS — M609 Myositis, unspecified: Secondary | ICD-10-CM

## 2014-03-03 DIAGNOSIS — G4700 Insomnia, unspecified: Secondary | ICD-10-CM | POA: Diagnosis present

## 2014-03-03 DIAGNOSIS — F332 Major depressive disorder, recurrent severe without psychotic features: Secondary | ICD-10-CM | POA: Diagnosis present

## 2014-03-03 DIAGNOSIS — T426X2A Poisoning by other antiepileptic and sedative-hypnotic drugs, intentional self-harm, initial encounter: Secondary | ICD-10-CM | POA: Diagnosis present

## 2014-03-03 DIAGNOSIS — F17210 Nicotine dependence, cigarettes, uncomplicated: Secondary | ICD-10-CM | POA: Diagnosis present

## 2014-03-03 DIAGNOSIS — T428X2A Poisoning by antiparkinsonism drugs and other central muscle-tone depressants, intentional self-harm, initial encounter: Principal | ICD-10-CM | POA: Diagnosis present

## 2014-03-03 DIAGNOSIS — R40243 Glasgow coma scale score 3-8: Secondary | ICD-10-CM | POA: Diagnosis present

## 2014-03-03 DIAGNOSIS — G9340 Encephalopathy, unspecified: Secondary | ICD-10-CM | POA: Diagnosis present

## 2014-03-03 DIAGNOSIS — T510X4A Toxic effect of ethanol, undetermined, initial encounter: Secondary | ICD-10-CM

## 2014-03-03 DIAGNOSIS — R4182 Altered mental status, unspecified: Secondary | ICD-10-CM | POA: Diagnosis not present

## 2014-03-03 DIAGNOSIS — D649 Anemia, unspecified: Secondary | ICD-10-CM | POA: Diagnosis present

## 2014-03-03 DIAGNOSIS — IMO0001 Reserved for inherently not codable concepts without codable children: Secondary | ICD-10-CM

## 2014-03-03 DIAGNOSIS — I10 Essential (primary) hypertension: Secondary | ICD-10-CM | POA: Diagnosis present

## 2014-03-03 DIAGNOSIS — Y9289 Other specified places as the place of occurrence of the external cause: Secondary | ICD-10-CM

## 2014-03-03 DIAGNOSIS — E119 Type 2 diabetes mellitus without complications: Secondary | ICD-10-CM | POA: Diagnosis present

## 2014-03-03 DIAGNOSIS — E785 Hyperlipidemia, unspecified: Secondary | ICD-10-CM | POA: Diagnosis present

## 2014-03-03 DIAGNOSIS — Z79899 Other long term (current) drug therapy: Secondary | ICD-10-CM

## 2014-03-03 DIAGNOSIS — Z885 Allergy status to narcotic agent status: Secondary | ICD-10-CM

## 2014-03-03 DIAGNOSIS — J45909 Unspecified asthma, uncomplicated: Secondary | ICD-10-CM | POA: Diagnosis present

## 2014-03-03 DIAGNOSIS — R4189 Other symptoms and signs involving cognitive functions and awareness: Secondary | ICD-10-CM

## 2014-03-03 DIAGNOSIS — F10129 Alcohol abuse with intoxication, unspecified: Secondary | ICD-10-CM | POA: Diagnosis present

## 2014-03-03 DIAGNOSIS — T50904A Poisoning by unspecified drugs, medicaments and biological substances, undetermined, initial encounter: Secondary | ICD-10-CM

## 2014-03-03 DIAGNOSIS — T50901A Poisoning by unspecified drugs, medicaments and biological substances, accidental (unintentional), initial encounter: Secondary | ICD-10-CM | POA: Diagnosis present

## 2014-03-03 DIAGNOSIS — M791 Myalgia: Secondary | ICD-10-CM

## 2014-03-03 DIAGNOSIS — M797 Fibromyalgia: Secondary | ICD-10-CM | POA: Diagnosis present

## 2014-03-03 DIAGNOSIS — F411 Generalized anxiety disorder: Secondary | ICD-10-CM | POA: Diagnosis present

## 2014-03-03 LAB — SALICYLATE LEVEL: Salicylate Lvl: <2 mg/dL — ABNORMAL LOW (ref 2.8–20.0)

## 2014-03-03 LAB — COMPREHENSIVE METABOLIC PANEL
ALK PHOS: 94 U/L (ref 39–117)
ALT: 19 U/L (ref 0–35)
ANION GAP: 14 (ref 5–15)
AST: 22 U/L (ref 0–37)
Albumin: 3.5 g/dL (ref 3.5–5.2)
BUN: 5 mg/dL — ABNORMAL LOW (ref 6–23)
CHLORIDE: 103 meq/L (ref 96–112)
CO2: 24 mEq/L (ref 19–32)
CREATININE: 0.74 mg/dL (ref 0.50–1.10)
Calcium: 8.6 mg/dL (ref 8.4–10.5)
GLUCOSE: 152 mg/dL — AB (ref 70–99)
POTASSIUM: 4.2 meq/L (ref 3.7–5.3)
Sodium: 141 mEq/L (ref 137–147)
Total Protein: 7.2 g/dL (ref 6.0–8.3)

## 2014-03-03 LAB — CBC WITH DIFFERENTIAL/PLATELET
BASOS ABS: 0.1 10*3/uL (ref 0.0–0.1)
Basophils Relative: 1 % (ref 0–1)
Eosinophils Absolute: 0.3 10*3/uL (ref 0.0–0.7)
Eosinophils Relative: 5 % (ref 0–5)
HCT: 36.3 % (ref 36.0–46.0)
Hemoglobin: 11.7 g/dL — ABNORMAL LOW (ref 12.0–15.0)
LYMPHS PCT: 30 % (ref 12–46)
Lymphs Abs: 2 10*3/uL (ref 0.7–4.0)
MCH: 25.2 pg — ABNORMAL LOW (ref 26.0–34.0)
MCHC: 32.2 g/dL (ref 30.0–36.0)
MCV: 78.1 fL (ref 78.0–100.0)
MONO ABS: 0.4 10*3/uL (ref 0.1–1.0)
Monocytes Relative: 6 % (ref 3–12)
NEUTROS ABS: 3.8 10*3/uL (ref 1.7–7.7)
NEUTROS PCT: 58 % (ref 43–77)
PLATELETS: 273 10*3/uL (ref 150–400)
RBC: 4.65 MIL/uL (ref 3.87–5.11)
RDW: 19.1 % — AB (ref 11.5–15.5)
WBC: 6.5 10*3/uL (ref 4.0–10.5)

## 2014-03-03 LAB — ETHANOL: ALCOHOL ETHYL (B): 261 mg/dL — AB (ref 0–11)

## 2014-03-03 LAB — I-STAT BETA HCG BLOOD, ED (MC, WL, AP ONLY): I-stat hCG, quantitative: <5 m[IU]/mL (ref <5)

## 2014-03-03 LAB — ACETAMINOPHEN LEVEL: Acetaminophen (Tylenol), Serum: <15 ug/mL (ref 10–30)

## 2014-03-03 MED ORDER — MELOXICAM 15 MG PO TABS: 15.0000 mg | ORAL_TABLET | Freq: Every day | ORAL | Status: DC

## 2014-03-03 MED ORDER — CARISOPRODOL 350 MG PO TABS: 350.0000 mg | ORAL_TABLET | Freq: Four times a day (QID) | ORAL | Status: DC | PRN

## 2014-03-03 MED ORDER — SUCCINYLCHOLINE CHLORIDE 20 MG/ML IJ SOLN
INTRAMUSCULAR | Status: AC
  Filled 2014-03-03: qty 1

## 2014-03-03 MED ORDER — LIDOCAINE HCL (CARDIAC) 20 MG/ML IV SOLN
INTRAVENOUS | Status: AC
  Filled 2014-03-03: qty 5

## 2014-03-03 MED ORDER — SODIUM CHLORIDE 0.9 % IV BOLUS (SEPSIS)
1000.0000 mL | Freq: Once | INTRAVENOUS | Status: AC
  Administered 2014-03-03: 1000 mL via INTRAVENOUS

## 2014-03-03 MED ORDER — METHYLPREDNISOLONE 4 MG PO KIT: PACK | ORAL | Status: DC

## 2014-03-03 MED ORDER — TRAMADOL HCL 50 MG PO TABS: ORAL_TABLET | ORAL | Status: DC

## 2014-03-03 MED ORDER — ZOLPIDEM TARTRATE 10 MG PO TABS
10.0000 mg | ORAL_TABLET | Freq: Every evening | ORAL | Status: DC | PRN
Start: 2014-03-03 — End: 2014-03-05

## 2014-03-03 MED ORDER — ROCURONIUM BROMIDE 50 MG/5ML IV SOLN
INTRAVENOUS | Status: AC
  Filled 2014-03-03: qty 2

## 2014-03-03 MED ORDER — ETOMIDATE 2 MG/ML IV SOLN
INTRAVENOUS | Status: AC
  Filled 2014-03-03: qty 20

## 2014-03-03 NOTE — Telephone Encounter (Signed)
Have cancelled Prednisone. Go with Mobic instead.

## 2014-03-03 NOTE — Progress Notes (Addendum)
Subjective:    Patient ID: Amber Mata, female    DOB: 11/26/1960, 53 y.o.   MRN: [id number]  HPI  Patient in today complaining of generalized body aches throughout her body. Complaining of pain left anterior chest. Says she feels like she has the flu. No fever or chills. No red hot joints. Last visit we tried her on Mobic and Flexeril. We also gave her Dalmane for sleep. Says Dalmane is not working. Is afraid of Mobic outcome a few because she's had what she describes as a "mini " gastric bypass surgery in the past and is afraid of anti-inflammatory medications because she was concerned it could harm her liver or kidneys. Explained to her that we really didn't have a lot of options. I proceeded prescribed a 6 day prednisone dosepak but she said the prednisone makes her "mean". So we canceled the order for prednisone. She is on Cymbalta. At one point we started her on Lexapro. We did not realize until today that she was actually taking both these medicines. She needs to taper off of Lexapro and continue with Cymbalta. I think Cymbalta might give her more pain relief than Lexapro. 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 Has no refills left on tramadol. We're going to change Dalmane to Ambien 10 mg at bedtime.  Says she has seen rheumatologist in the past but could not remember his name. We called around and found that it was Dr. Amil Amen. We have drawn rheumatology studies today including sedimentation rate, ANA, rheumatoid factor, total CK.  She is complaining of pain in her shoulders and her left parasternal area with point tenderness along the rib. She says she's had massages and has tried water exercises. Feels that none of this is helping. Says she has been to a Restaurant manager, fast food.  Seems to be frustrated that nothing is working for her. Denies being under excessive amount of stress.  Explained to her that not sleeping could be causing some of the symptoms as well. Also explained to her I'm suspicious she may  have fibromyalgia syndrome and needs further evaluation with rheumatologist. She wanted more information about fibromyalgia. I have mailed her some info from [employer].  She called back after leaving the office asking about shingles vaccine. In my opinion, I would defer shingles vaccine until age 89. I have also mailed her informational shingles vaccine because she says she has a brother who has shingles and she's afraid she will get shingles to her husband. Told her I did not think that was possible.  Even if she had a shingles outbreak, she could only give husband chickenpox    Review of Systems     Objective:   Physical Exam She has point tenderness in her left parasternal area along mid rib. She has tenderness in her trapezius muscles bilaterally extending into her left sternocleidomastoid muscle. Some mild tenderness in the parascapular areas. Straight leg raising is negative at 90 bilaterally. Muscle strength is normal in upper and lower extremities. There are no hot red joints that are detected today on exam.       Assessment & Plan:  Probable fibromyalgia syndrome  Insomnia  Plan: Soma as needed 4 times daily, Cymbalta, stop Lexapro over the next 4 weeks after taper, stop Flexeril. Continue to try mobile 15 mg daily. She wants liver functions and kidney functions checked today which we did. Continue tramadol sparingly for pain. Change Dalmane to Ambien. Rheumatology consultation.  Patient wants to try Soma instead of Flexeril.  Says she has some Soma left over from an old prescription. Feels that it works better.  30 minutes spent with patient today.

## 2014-03-03 NOTE — ED Provider Notes (Signed)
Pt seen with resident for AMS likely due to overdose and ETOH Abuse Initial GCS 8 and pt had hypoxia During ED stay her mental status began to improve, pt would arouse and speak and no hypoxia Will need admission/monitoring   Sharyon Cable, MD 03/03/14 2341

## 2014-03-03 NOTE — ED Notes (Signed)
Pt's husband updated on plan of care - pt's husband to go home and will check on pt in the morning.

## 2014-03-03 NOTE — Telephone Encounter (Signed)
Tried to contact patient regarding her concerns, but number was not working.  Tried to call patients husband, left message for patient to call office.  Per Dr Renold Genta, patient should follow the plan she wanted... mobic, muscle relaxer, follow up with rheumatologist.  No steroids at this time.

## 2014-03-03 NOTE — Patient Instructions (Signed)
Change Dalmane to Ambien. Change Flexeril to [employer]. Discontinue Lexapro. Continue Cymbalta. Try Mobic. Take tramadol sparingly. See rheumatologist. Lab work is pending.

## 2014-03-03 NOTE — Telephone Encounter (Signed)
Patient called back; states that she WILL take the medications that you have recommended for her.  She WILL follow the recommended treatment and appointments as you have prescribed and recommended for her.  She is "MEAN" when she is on Prednisone, but she WILL take it.  She would also like a prescription for the shingles vaccine. She know that she will have to pay for it and it will cost her $200+, but she is ok with that.  Her brother has the shingles now and she would like to have the vaccination.    Her pharmacy is:  [facility].  She doesn't want you upset with her.  So, she will follow your treatment plan and recommendations as discussed today during her visit.

## 2014-03-03 NOTE — ED Notes (Signed)
Per [facility] - pt from home, found unresponsive by husband -unknown amount of time pt has been unresponsive. Pt's husband states she has recently been increasing her alcohol consumption to "deal with her fibromyalgia" - pt also took x4 soma, x2 ambien, tramadol, restoril, solumedrol is missing, and mobic - pt only repsonds to painful stimuli. Pt initially found w/ snoring respirations amnd 80% spo2 on RA - NPA inserted w/ minimal response from pt and 1mg  narcan given IVP w/ minimal change in mental status.

## 2014-03-04 ENCOUNTER — Encounter (HOSPITAL_COMMUNITY): Payer: Self-pay | Admitting: Internal Medicine

## 2014-03-04 ENCOUNTER — Other Ambulatory Visit (HOSPITAL_COMMUNITY): Payer: BC Managed Care – PPO

## 2014-03-04 ENCOUNTER — Telehealth: Payer: Self-pay

## 2014-03-04 ENCOUNTER — Encounter: Payer: Self-pay | Admitting: Internal Medicine

## 2014-03-04 ENCOUNTER — Telehealth: Payer: Self-pay | Admitting: Internal Medicine

## 2014-03-04 DIAGNOSIS — I10 Essential (primary) hypertension: Secondary | ICD-10-CM | POA: Diagnosis present

## 2014-03-04 DIAGNOSIS — F1010 Alcohol abuse, uncomplicated: Secondary | ICD-10-CM

## 2014-03-04 DIAGNOSIS — IMO0001 Reserved for inherently not codable concepts without codable children: Secondary | ICD-10-CM

## 2014-03-04 DIAGNOSIS — M797 Fibromyalgia: Secondary | ICD-10-CM | POA: Diagnosis present

## 2014-03-04 DIAGNOSIS — R4182 Altered mental status, unspecified: Secondary | ICD-10-CM | POA: Diagnosis present

## 2014-03-04 DIAGNOSIS — R45851 Suicidal ideations: Secondary | ICD-10-CM

## 2014-03-04 DIAGNOSIS — F332 Major depressive disorder, recurrent severe without psychotic features: Secondary | ICD-10-CM

## 2014-03-04 DIAGNOSIS — E119 Type 2 diabetes mellitus without complications: Secondary | ICD-10-CM

## 2014-03-04 DIAGNOSIS — R40243 Glasgow coma scale score 3-8: Secondary | ICD-10-CM | POA: Diagnosis present

## 2014-03-04 DIAGNOSIS — D649 Anemia, unspecified: Secondary | ICD-10-CM | POA: Diagnosis present

## 2014-03-04 DIAGNOSIS — T50901A Poisoning by unspecified drugs, medicaments and biological substances, accidental (unintentional), initial encounter: Secondary | ICD-10-CM | POA: Diagnosis present

## 2014-03-04 DIAGNOSIS — G9340 Encephalopathy, unspecified: Secondary | ICD-10-CM | POA: Diagnosis present

## 2014-03-04 DIAGNOSIS — Z885 Allergy status to narcotic agent status: Secondary | ICD-10-CM | POA: Diagnosis not present

## 2014-03-04 DIAGNOSIS — J45909 Unspecified asthma, uncomplicated: Secondary | ICD-10-CM | POA: Diagnosis present

## 2014-03-04 DIAGNOSIS — T510X1A Toxic effect of ethanol, accidental (unintentional), initial encounter: Secondary | ICD-10-CM | POA: Insufficient documentation

## 2014-03-04 DIAGNOSIS — H409 Unspecified glaucoma: Secondary | ICD-10-CM | POA: Diagnosis present

## 2014-03-04 DIAGNOSIS — Y9289 Other specified places as the place of occurrence of the external cause: Secondary | ICD-10-CM | POA: Diagnosis not present

## 2014-03-04 DIAGNOSIS — T428X2A Poisoning by antiparkinsonism drugs and other central muscle-tone depressants, intentional self-harm, initial encounter: Secondary | ICD-10-CM | POA: Diagnosis present

## 2014-03-04 DIAGNOSIS — T50902A Poisoning by unspecified drugs, medicaments and biological substances, intentional self-harm, initial encounter: Secondary | ICD-10-CM

## 2014-03-04 DIAGNOSIS — F17210 Nicotine dependence, cigarettes, uncomplicated: Secondary | ICD-10-CM | POA: Diagnosis present

## 2014-03-04 DIAGNOSIS — T510X4A Toxic effect of ethanol, undetermined, initial encounter: Secondary | ICD-10-CM

## 2014-03-04 DIAGNOSIS — E785 Hyperlipidemia, unspecified: Secondary | ICD-10-CM | POA: Diagnosis present

## 2014-03-04 DIAGNOSIS — F411 Generalized anxiety disorder: Secondary | ICD-10-CM | POA: Diagnosis present

## 2014-03-04 DIAGNOSIS — F1994 Other psychoactive substance use, unspecified with psychoactive substance-induced mood disorder: Secondary | ICD-10-CM

## 2014-03-04 DIAGNOSIS — T426X2A Poisoning by other antiepileptic and sedative-hypnotic drugs, intentional self-harm, initial encounter: Secondary | ICD-10-CM | POA: Diagnosis present

## 2014-03-04 DIAGNOSIS — G4700 Insomnia, unspecified: Secondary | ICD-10-CM | POA: Diagnosis present

## 2014-03-04 DIAGNOSIS — F10129 Alcohol abuse with intoxication, unspecified: Secondary | ICD-10-CM | POA: Diagnosis present

## 2014-03-04 LAB — URINALYSIS, ROUTINE W REFLEX MICROSCOPIC
BILIRUBIN URINE: NEGATIVE
Glucose, UA: NEGATIVE mg/dL
Hgb urine dipstick: NEGATIVE
Ketones, ur: NEGATIVE mg/dL
LEUKOCYTES UA: NEGATIVE
Nitrite: NEGATIVE
Protein, ur: NEGATIVE mg/dL
SPECIFIC GRAVITY, URINE: 1.006 (ref 1.005–1.030)
UROBILINOGEN UA: 0.2 mg/dL (ref 0.0–1.0)
pH: 5.5 (ref 5.0–8.0)

## 2014-03-04 LAB — RAPID URINE DRUG SCREEN, HOSP PERFORMED
Amphetamines: NOT DETECTED
BARBITURATES: NOT DETECTED
BENZODIAZEPINES: NOT DETECTED
Cocaine: NOT DETECTED
Opiates: NOT DETECTED
Tetrahydrocannabinol: NOT DETECTED

## 2014-03-04 LAB — BASIC METABOLIC PANEL
BUN: 8 mg/dL (ref 6–23)
CHLORIDE: 101 meq/L (ref 96–112)
CO2: 26 meq/L (ref 19–32)
CREATININE: 0.67 mg/dL (ref 0.50–1.10)
Calcium: 8.9 mg/dL (ref 8.4–10.5)
Glucose, Bld: 100 mg/dL — ABNORMAL HIGH (ref 70–99)
Potassium: 4.1 mEq/L (ref 3.5–5.3)
Sodium: 136 mEq/L (ref 135–145)

## 2014-03-04 LAB — GLUCOSE, CAPILLARY
GLUCOSE-CAPILLARY: 108 mg/dL — AB (ref 70–99)
GLUCOSE-CAPILLARY: 249 mg/dL — AB (ref 70–99)
Glucose-Capillary: 100 mg/dL — ABNORMAL HIGH (ref 70–99)
Glucose-Capillary: 141 mg/dL — ABNORMAL HIGH (ref 70–99)
Glucose-Capillary: 201 mg/dL — ABNORMAL HIGH (ref 70–99)
Glucose-Capillary: 99 mg/dL (ref 70–99)

## 2014-03-04 LAB — CBC WITH DIFFERENTIAL/PLATELET
BASOS PCT: 0 % (ref 0–1)
Basophils Absolute: 0 10*3/uL (ref 0.0–0.1)
Eosinophils Absolute: 0 10*3/uL (ref 0.0–0.7)
Eosinophils Relative: 1 % (ref 0–5)
HCT: 35.7 % — ABNORMAL LOW (ref 36.0–46.0)
Hemoglobin: 11.3 g/dL — ABNORMAL LOW (ref 12.0–15.0)
Lymphocytes Relative: 24 % (ref 12–46)
Lymphs Abs: 1.8 10*3/uL (ref 0.7–4.0)
MCH: 25.6 pg — ABNORMAL LOW (ref 26.0–34.0)
MCHC: 31.7 g/dL (ref 30.0–36.0)
MCV: 80.8 fL (ref 78.0–100.0)
Monocytes Absolute: 0.5 10*3/uL (ref 0.1–1.0)
Monocytes Relative: 7 % (ref 3–12)
NEUTROS PCT: 68 % (ref 43–77)
Neutro Abs: 5 10*3/uL (ref 1.7–7.7)
Platelets: 284 10*3/uL (ref 150–400)
RBC: 4.42 MIL/uL (ref 3.87–5.11)
RDW: 19.2 % — AB (ref 11.5–15.5)
WBC: 7.4 10*3/uL (ref 4.0–10.5)

## 2014-03-04 LAB — RHEUMATOID FACTOR: Rhuematoid fact SerPl-aCnc: 25 IU/mL — ABNORMAL HIGH (ref <=14)

## 2014-03-04 LAB — I-STAT ARTERIAL BLOOD GAS, ED
Acid-base deficit: 3 mmol/L — ABNORMAL HIGH (ref 0.0–2.0)
Bicarbonate: 23.6 mEq/L (ref 20.0–24.0)
O2 SAT: 93 %
PCO2 ART: 47.5 mmHg — AB (ref 35.0–45.0)
TCO2: 25 mmol/L (ref 0–100)
pH, Arterial: 7.305 — ABNORMAL LOW (ref 7.350–7.450)
pO2, Arterial: 73 mmHg — ABNORMAL LOW (ref 80.0–100.0)

## 2014-03-04 LAB — ANA: Anti Nuclear Antibody(ANA): NEGATIVE

## 2014-03-04 LAB — COMPREHENSIVE METABOLIC PANEL
ALT: 18 U/L (ref 0–35)
AST: 21 U/L (ref 0–37)
Albumin: 3.3 g/dL — ABNORMAL LOW (ref 3.5–5.2)
Alkaline Phosphatase: 93 U/L (ref 39–117)
Anion gap: 16 — ABNORMAL HIGH (ref 5–15)
BUN: 6 mg/dL (ref 6–23)
CALCIUM: 8.2 mg/dL — AB (ref 8.4–10.5)
CO2: 21 meq/L (ref 19–32)
Chloride: 108 mEq/L (ref 96–112)
Creatinine, Ser: 0.58 mg/dL (ref 0.50–1.10)
GLUCOSE: 115 mg/dL — AB (ref 70–99)
Potassium: 4.5 mEq/L (ref 3.7–5.3)
Sodium: 145 mEq/L (ref 137–147)
Total Protein: 6.9 g/dL (ref 6.0–8.3)

## 2014-03-04 LAB — TROPONIN I: Troponin I: <0.3 ng/mL (ref <0.30)

## 2014-03-04 LAB — HEPATIC FUNCTION PANEL
ALBUMIN: 4.1 g/dL (ref 3.5–5.2)
ALK PHOS: 88 U/L (ref 39–117)
ALT: 17 U/L (ref 0–35)
AST: 22 U/L (ref 0–37)
BILIRUBIN INDIRECT: 0.4 mg/dL (ref 0.2–1.2)
BILIRUBIN TOTAL: 0.5 mg/dL (ref 0.2–1.2)
Bilirubin, Direct: 0.1 mg/dL (ref 0.0–0.3)
TOTAL PROTEIN: 6.9 g/dL (ref 6.0–8.3)

## 2014-03-04 LAB — TSH: TSH: 1.07 u[IU]/mL (ref 0.350–4.500)

## 2014-03-04 LAB — SEDIMENTATION RATE: Sed Rate: 8 mm/hr (ref 0–22)

## 2014-03-04 LAB — CK: CK TOTAL: 60 U/L (ref 7–177)

## 2014-03-04 LAB — MRSA PCR SCREENING: MRSA by PCR: NEGATIVE

## 2014-03-04 LAB — AMMONIA: Ammonia: 58 umol/L (ref 11–60)

## 2014-03-04 LAB — CHOLESTEROL, TOTAL: Cholesterol: 128 mg/dL (ref 0–200)

## 2014-03-04 MED ORDER — ONDANSETRON HCL 4 MG PO TABS: 4.0000 mg | ORAL_TABLET | Freq: Four times a day (QID) | ORAL | Status: DC | PRN

## 2014-03-04 MED ORDER — ENOXAPARIN SODIUM 40 MG/0.4ML ~~LOC~~ SOLN
40.0000 mg | SUBCUTANEOUS | Status: DC
  Administered 2014-03-05: 40 mg via SUBCUTANEOUS
  Filled 2014-03-04 (×2): qty 0.4

## 2014-03-04 MED ORDER — ONDANSETRON HCL 4 MG/2ML IJ SOLN: 4.0000 mg | Freq: Four times a day (QID) | INTRAMUSCULAR | Status: DC | PRN

## 2014-03-04 MED ORDER — INSULIN ASPART 100 UNIT/ML ~~LOC~~ SOLN: 0.0000 [IU] | SUBCUTANEOUS | Status: DC

## 2014-03-04 MED ORDER — THIAMINE HCL 100 MG/ML IJ SOLN
100.0000 mg | Freq: Every day | INTRAMUSCULAR | Status: DC
  Filled 2014-03-04 (×2): qty 1

## 2014-03-04 MED ORDER — DULOXETINE HCL 60 MG PO CPEP
60.0000 mg | ORAL_CAPSULE | Freq: Every day | ORAL | Status: DC
  Administered 2014-03-04 – 2014-03-05 (×2): 60 mg via ORAL
  Filled 2014-03-04 (×2): qty 1

## 2014-03-04 MED ORDER — HYDRALAZINE HCL 20 MG/ML IJ SOLN: 10.0000 mg | INTRAMUSCULAR | Status: DC | PRN

## 2014-03-04 MED ORDER — TRAMADOL HCL 50 MG PO TABS
50.0000 mg | ORAL_TABLET | Freq: Four times a day (QID) | ORAL | Status: DC | PRN
  Administered 2014-03-04: 50 mg via ORAL
  Filled 2014-03-04: qty 1

## 2014-03-04 MED ORDER — VALSARTAN-HYDROCHLOROTHIAZIDE 160-12.5 MG PO TABS: 1.0000 | ORAL_TABLET | Freq: Every day | ORAL | Status: DC

## 2014-03-04 MED ORDER — FOLIC ACID 1 MG PO TABS
1.0000 mg | ORAL_TABLET | Freq: Every day | ORAL | Status: DC
  Administered 2014-03-05: 1 mg via ORAL
  Filled 2014-03-04 (×2): qty 1

## 2014-03-04 MED ORDER — SODIUM CHLORIDE 0.9 % IV SOLN
INTRAVENOUS | Status: DC
  Administered 2014-03-04: 500 mL via INTRAVENOUS

## 2014-03-04 MED ORDER — CLONAZEPAM 1 MG PO TABS
1.0000 mg | ORAL_TABLET | Freq: Two times a day (BID) | ORAL | Status: DC
  Administered 2014-03-04 – 2014-03-05 (×2): 1 mg via ORAL
  Filled 2014-03-04: qty 1
  Filled 2014-03-04: qty 2
  Filled 2014-03-04: qty 1

## 2014-03-04 MED ORDER — ACETAMINOPHEN 325 MG PO TABS
650.0000 mg | ORAL_TABLET | Freq: Four times a day (QID) | ORAL | Status: DC | PRN
  Administered 2014-03-04: 650 mg via ORAL
  Filled 2014-03-04: qty 2

## 2014-03-04 MED ORDER — HYDROCHLOROTHIAZIDE 12.5 MG PO CAPS
12.5000 mg | ORAL_CAPSULE | Freq: Every day | ORAL | Status: DC
  Administered 2014-03-04 – 2014-03-05 (×2): 12.5 mg via ORAL
  Filled 2014-03-04 (×2): qty 1

## 2014-03-04 MED ORDER — ACETAMINOPHEN 650 MG RE SUPP: 650.0000 mg | Freq: Four times a day (QID) | RECTAL | Status: DC | PRN

## 2014-03-04 MED ORDER — IRBESARTAN 150 MG PO TABS
150.0000 mg | ORAL_TABLET | Freq: Every day | ORAL | Status: DC
  Administered 2014-03-04 – 2014-03-05 (×2): 150 mg via ORAL
  Filled 2014-03-04 (×2): qty 1

## 2014-03-04 MED ORDER — VITAMIN B-1 100 MG PO TABS
100.0000 mg | ORAL_TABLET | Freq: Every day | ORAL | Status: DC
  Administered 2014-03-05: 100 mg via ORAL
  Filled 2014-03-04 (×2): qty 1

## 2014-03-04 MED ORDER — ADULT MULTIVITAMIN W/MINERALS CH
1.0000 | ORAL_TABLET | Freq: Every day | ORAL | Status: DC
  Administered 2014-03-05: 1 via ORAL
  Filled 2014-03-04 (×2): qty 1

## 2014-03-04 MED ORDER — LORAZEPAM 2 MG/ML IJ SOLN: 1.0000 mg | Freq: Four times a day (QID) | INTRAMUSCULAR | Status: DC | PRN

## 2014-03-04 MED ORDER — LORAZEPAM 1 MG PO TABS
1.0000 mg | ORAL_TABLET | Freq: Four times a day (QID) | ORAL | Status: DC | PRN
  Administered 2014-03-05: 1 mg via ORAL
  Filled 2014-03-04: qty 1

## 2014-03-04 NOTE — Plan of Care (Signed)
Problem: Phase II Progression Outcomes Goal: Vital signs remain stable Outcome: Completed/Met Date Met:  03/04/14     

## 2014-03-04 NOTE — ED Notes (Signed)
Dr. Hal Hope notified of pt's episodes of desaturation - pt easily stimulated and SPO2 increases. TO given for CPAP.

## 2014-03-04 NOTE — Plan of Care (Signed)
Problem: Phase III Progression Outcomes Goal: Discharge plan remains appropriate-arrangements made Outcome: Completed/Met Date Met:  03/04/14     

## 2014-03-04 NOTE — Plan of Care (Signed)
Problem: Discharge Progression Outcomes Goal: Discharge plan in place and appropriate Outcome: Completed/Met Date Met:  03/04/14     

## 2014-03-04 NOTE — Plan of Care (Signed)
Problem: Phase I Progression Outcomes Goal: Other Phase I Outcomes/Goals Outcome: Not Applicable Date Met:  03/04/14     

## 2014-03-04 NOTE — Plan of Care (Signed)
Problem: Phase II Progression Outcomes Goal: Other Phase II Outcomes/Goals Outcome: Not Applicable Date Met:  46/50/35

## 2014-03-04 NOTE — Progress Notes (Addendum)
Came to see patient as she requested a new doctor.  Patient says she made a mistake as she went out with friends and had 2 dirty martini's and then took too much sleeping medication as she does not sleep well.  She states "why would I want to kill myself when in 3 days I have a surprise birthday party for my husbands 60th birthday".  "My husband was here and I couldn't tell the other doctor as it is a surprise"  "I bought him an $12 drone."  She also states she just got an insurance check for $27,000 that needs to be cashed tomorrow. Will reach out to psychiatry to see if we can get a 2nd opinion or a re-eval.  Did not answer page.  Spoke with psych social worker to consider options.   Eulogio Bear DO

## 2014-03-04 NOTE — Plan of Care (Signed)
Problem: Phase II Progression Outcomes Goal: Progress activity as tolerated unless otherwise ordered Outcome: Completed/Met Date Met:  03/04/14

## 2014-03-04 NOTE — Plan of Care (Signed)
Problem: Phase I Progression Outcomes Goal: Initial discharge plan identified Outcome: Completed/Met Date Met:  03/04/14     

## 2014-03-04 NOTE — H&P (Addendum)
[facility] History and Physical  Amber Mata JAS:[id number] DOB: 01-Feb-1961 DOA: 03/03/2014  Referring physician: ER physician. PCP: Elby Showers, MD   Most of the history is obtained from ER physician as patient is encephalopathic and unable to reach patient's husband.  Chief Complaint: Unresponsiveness.  HPI: Amber Mata is a 53 y.o. female with history of diabetes mellitus and hyperlipidemia and depression and chronic pain was brought to the ER the patient has been found that patient was unresponsive after he came back from work last evening around 4 PM. In the ER patient was found to have a GCS of 7 and initially ER physician was planning to intubate but by then patient became digital responsive. CT head did not show any acute. As per the history provided by patient's husband to the ER physician patient may have taken extra dose of Soma around 4 more tablets than usual and Ambien couple more with alcohol. Patient alcohol level was found to be elevated. Tylenol levels and urine drug screen and salicylates were negative. Patient at this time is still quite encephalopathic and minimally responsive to painful stimuli. Pupils are reacting to light. Patient has been admitted for further management. Patient did not respond much to Narcan.  Review of Systems: As presented in the history of presenting illness, rest negative.  Past Medical History  Diagnosis Date  . Hypertension   . Depression   . Diabetes mellitus   . Insomnia   . Glaucoma   . Asthma     as a child  . Pneumothorax     secondary to pneumonia   Past Surgical History  Procedure Laterality Date  . Tubal ligation    . Knee surgery      left  . Mini gastric bypass  2006  . Tonsillectomy    . Abdominoplasty  2007   Social History:  reports that she has been smoking Cigarettes.  She has been smoking about 0.00 packs per day. She has never used smokeless tobacco. She reports that she drinks about 5.0 oz of alcohol per  week. She reports that she does not use illicit drugs. Where does patient live home. Can patient participate in ADLs? Yes.  Allergies  Allergen Reactions  . Morphine And Related   . Oxycodone     Itch    Family History:  Family History  Problem Relation Age of Onset  . Arthritis    . Diabetes Father   . Diabetes Paternal Grandfather   . Hyperlipidemia    . Hypertension    . Stroke    . Heart disease    . Heart attack Paternal Grandmother       Prior to Admission medications   Medication Sig Start Date End Date Taking? Authorizing Provider  Carisoprodol (SOMA PO) Take by mouth as needed.    Historical Provider, MD  carisoprodol (SOMA) 350 MG tablet Take 1 tablet (350 mg total) by mouth 4 (four) times daily as needed for muscle spasms. 03/03/14   Elby Showers, MD  DULoxetine (CYMBALTA) 30 MG capsule Take 3 tabs daily as needed 12/22/13   Elby Showers, MD  EPINEPHrine (EPIPEN) 0.3 mg/0.3 mL SOAJ injection Inject 0.3 mLs (0.3 mg total) into the muscle once. 03/24/13   Elby Showers, MD  estradiol (VIVELLE-DOT) 0.1 MG/24HR patch APPLY ONE PATCH TO THE SKIN TWICE A WEEK 09/04/13   Lyman Speller, MD  meloxicam (MOBIC) 15 MG tablet Take 1 tablet (15 mg total) by mouth daily. 03/03/14  Elby Showers, MD  metFORMIN (GLUCOPHAGE) 500 MG tablet take 1 tablet by mouth once daily 10/24/13   Elby Showers, MD  NONFORMULARY OR COMPOUNDED ITEM Topical testosterone 1% cream.  Apply 1/4 tsp to inner thighs three times weekly.  Disp:  60 grams 09/04/13   Lyman Speller, MD  progesterone (PROMETRIUM) 200 MG capsule TAKE ONE CAPSULE BY MOUTH ONCE DAILY 09/04/13   Lyman Speller, MD  traMADol (ULTRAM) 50 MG tablet TAKE ONE TABLET BY MOUTH THREE TIMES DAILY 03/03/14   Elby Showers, MD  valsartan-hydrochlorothiazide (DIOVAN-HCT) 160-12.5 MG per tablet Take 1 tablet by mouth daily. 03/18/13   Elby Showers, MD  zolpidem (AMBIEN) 10 MG tablet Take 1 tablet (10 mg total) by mouth at bedtime as  needed for sleep. 03/03/14 04/02/14  Elby Showers, MD    Physical Exam: Filed Vitals:   03/04/14 0200 03/04/14 0230 03/04/14 0245 03/04/14 0300  BP: 131/89 126/73 122/71 124/81  Pulse: 108 87 88 92  Resp: 17 15 15 15   SpO2: 98% 94% 92% 92%     General:  Well-developed and nourished.  Eyes: Anicteric no pallor.  ENT: No discharge from the ears eyes nose mouth.  Neck: No mass felt.  Cardiovascular: S1-S2 heard.  Respiratory: No rhonchi or crepitations.  Abdomen: Soft nontender bowel sounds present.  Skin: No rash.  Musculoskeletal: No edema.  Psychiatric: Patient presently is encephalopathic.  Neurologic: Patient is minimally responsive and further neurological exam is not possible.  Labs on Admission:  Basic Metabolic Panel:  Recent Labs Lab 03/03/14 1137 03/03/14 2248  NA 136 141  K 4.1 4.2  CL 101 103  CO2 26 24  GLUCOSE 100* 152*  BUN 8 5*  CREATININE 0.67 0.74  CALCIUM 8.9 8.6   Liver Function Tests:  Recent Labs Lab 03/03/14 1137 03/03/14 2248  AST 22 22  ALT 17 19  ALKPHOS 88 94  BILITOT 0.5 <0.2*  PROT 6.9 7.2  ALBUMIN 4.1 3.5   No results for input(s): LIPASE, AMYLASE in the last 168 hours. No results for input(s): AMMONIA in the last 168 hours. CBC:  Recent Labs Lab 03/03/14 2248  WBC 6.5  NEUTROABS 3.8  HGB 11.7*  HCT 36.3  MCV 78.1  PLT 273   Cardiac Enzymes: No results for input(s): CKTOTAL, CKMB, CKMBINDEX, TROPONINI in the last 168 hours.  BNP (last 3 results) No results for input(s): PROBNP in the last 8760 hours. CBG: No results for input(s): GLUCAP in the last 168 hours.  Radiological Exams on Admission: Ct Head Wo Contrast  03/04/2014   CLINICAL DATA:  53 year old female with altered mental status. Initial encounter.  EXAM: CT HEAD WITHOUT CONTRAST  TECHNIQUE: Contiguous axial images were obtained from the base of the skull through the vertex without intravenous contrast.  COMPARISON:  None.  FINDINGS: No  intracranial abnormalities are identified, including mass lesion or mass effect, hydrocephalus, extra-axial fluid collection, midline shift, hemorrhage, or acute infarction.  The visualized bony calvarium is unremarkable.  Nasal intubation noted.  IMPRESSION: No intracranial abnormalities.   Electronically Signed   By: Hassan Rowan M.D.   On: 03/04/2014 00:08   Dg Chest Portable 1 View  03/04/2014   CLINICAL DATA:  Altered mental status.  Initial encounter.  EXAM: PORTABLE CHEST - 1 VIEW  COMPARISON:  None.  FINDINGS: The cardiomediastinal silhouette is unremarkable.  This is a low volume film with bibasilar atelectasis.  There is no evidence of focal airspace disease,  pulmonary edema, suspicious pulmonary nodule/mass, pleural effusion, or pneumothorax. No acute bony abnormalities are identified.  IMPRESSION: Low volume film with bibasilar atelectasis.   Electronically Signed   By: Hassan Rowan M.D.   On: 03/04/2014 00:28    EKG: Independently reviewed. Sinus tachycardia.  Assessment/Plan Principal Problem:   Acute encephalopathy Active Problems:   Drug overdose   Diabetes mellitus type 2, controlled   Normocytic anemia   1. Acute encephalopathy most likely from drug overdose - as per the history provided by husband to the ER physician patient took more than usual dose of Soma Ambien along with alcohol. Patient doesn't have a history of depression and this time is not known if patient was suicidal as patient is still encephalopathic. ABG and ammonia levels have been ordered and are pending. Patient will be admitted to stepdown. And continue with IV hydration. The patient's mental status does not improve then may need further tests. Patient is presently afebrile. Check EEG to rule out any seizures. 2. History of alcoholism - alcohol levels are elevated. Patient has been placed on alcohol withdrawal protocol using when necessary Ativan with IV thiamine. 3. Depression - at this time patient has been placed  on suicide precautions. Patient will need psychiatric consult when patient is alert and awake. 4. History of diabetes mellitus type 2 - since patient is nothing by mouth patient will be placed on CBG sliding scale coverage. 5. Hypertension - since patient nothing by mouth patient will be placed on when necessary IV hydralazine. 6. Mild anemia - follow CBC.  Addendum - I was able to reach patient's husband later. Patient's husband states that he's not sure if patient had any suicidal ideation. Patient had gone to her PCP yesterday and had medications refill and 2 of Ambien and 4 of Soma was missing and patient drinks alcohol everyday.  Code Status: Full code.  Family Communication: I was able to reach patient's husband later. Disposition Plan: Admit to inpatient.    Sephira Zellman N. [facility] Pager [phone].  If 7PM-7AM, please contact night-coverage [email or web address] Password [facility] 03/04/2014, 3:34 AM

## 2014-03-04 NOTE — Plan of Care (Signed)
Problem: Consults Goal: Nutrition Consult-if indicated Outcome: Not Applicable Date Met:  67/01/41

## 2014-03-04 NOTE — Plan of Care (Signed)
Problem: Discharge Progression Outcomes Goal: Complications resolved/controlled Outcome: Completed/Met Date Met:  03/04/14

## 2014-03-04 NOTE — Plan of Care (Signed)
Problem: Phase I Progression Outcomes Goal: Voiding-avoid urinary catheter unless indicated Outcome: Completed/Met Date Met:  03/04/14     

## 2014-03-04 NOTE — Plan of Care (Signed)
Problem: Phase III Progression Outcomes Goal: Activity at appropriate level-compared to baseline (UP IN CHAIR FOR HEMODIALYSIS)  Outcome: Completed/Met Date Met:  03/04/14     

## 2014-03-04 NOTE — Plan of Care (Signed)
Problem: Phase III Progression Outcomes Goal: Foley discontinued Outcome: Not Applicable Date Met:  03/04/14     

## 2014-03-04 NOTE — Plan of Care (Signed)
Problem: Discharge Progression Outcomes Goal: Tolerating diet Outcome: Completed/Met Date Met:  03/04/14     

## 2014-03-04 NOTE — Plan of Care (Signed)
Problem: Phase III Progression Outcomes Goal: Pain controlled on oral analgesia Outcome: Completed/Met Date Met:  03/04/14     

## 2014-03-04 NOTE — Plan of Care (Signed)
Problem: Phase I Progression Outcomes Goal: Hemodynamically stable Outcome: Completed/Met Date Met:  03/04/14

## 2014-03-04 NOTE — Telephone Encounter (Signed)
Total CK, and CCP labs added.

## 2014-03-04 NOTE — Consult Note (Signed)
[facility] Face-to-Face Psychiatry Consult   Reason for Consult:  Intentional overdose of soma, Ambien and alcohol intoxication Referring Physician:  Dr. Kirke Corin [name] is an 53 y.o. female. Total Time spent with patient: 45 minutes  Assessment: AXIS I:  Alcohol Abuse, Generalized Anxiety Disorder, Major Depression, Recurrent severe and Substance Induced Mood Disorder AXIS II:  Cluster B Traits AXIS III:   Past Medical History  Diagnosis Date  . Hypertension   . Depression   . Diabetes mellitus   . Insomnia   . Glaucoma   . Asthma     as a child  . Pneumothorax     secondary to pneumonia   AXIS IV:  other psychosocial or environmental problems, problems related to social environment and problems with primary support group AXIS V:  41-50 serious symptoms  Plan: Spoke with Otila Kluver, before meals from the behavioral health Hospital and staff RN and Dr. Sloan Leiter Recommended involuntary commitment as patient is refusing treatment Referred to the psychiatric social services for appropriate paperwork regarding commitment Cymbalta 60 mg daily for depression and Klonopin 1 mg 2 times daily for anxiety and restlessness Recommend psychiatric Inpatient admission when medically cleared. Supportive therapy provided about ongoing stressors.  Appreciate psychiatric consultation and follow up as clinically required Please contact [phone] or [phone] if needs further assistance  Subjective:   Amber Mata is a 53 y.o. female patient admitted with depression with suicidal attempt.  HPI:  Amber Mata is a 53 y.o. female seen, chart reviewed and case discussed with the Otila Kluver, before meals from the behavioral health Hospital and staff RN and Dr. Sloan Leiter. Patient is poorly cooperative during this evaluation. Patient has been suffering with the medical problems and has been seeing outpatient family therapist for the last 5 years. Patient has been suffering with chronic pain syndrome, probably fibromyalgia,  receiving multiple medication from different doctors without significant relief. Patient reportedly overdosed her medications Ambien, Soma unknown dose and also drank alcohol. Patient blood alcohol level is 261 on admission. Patient presented to the hospital with the unresponsively. Patient does not trust her physicians and stated she is feeling safe and trying to walk away from the hospital without both medical and psychiatric clearance. Patient minimizes her symptoms of depression, anxiety and suicidal ideation and has a poor insight into her current state of mind. Hospital security and [employer] has to be called to keep her safe in the hospital. Patient is dangerous to herself and others as she has poor insight, impulsive and in judgment. Patient husband is concerned about her safety as she has been overdosing on her medication. Patient was seen by Dr. Loretha Stapler has outpatient who is also concerned about patient's safety and current mental status.  Medical history: Patient with history of diabetes mellitus and hyperlipidemia and depression and chronic pain was brought to the ER the patient has been found that patient was unresponsive after he came back from work last evening around 4 PM. In the ER patient was found to have a GCS of 7 and initially ER physician was planning to intubate but by then patient became digital responsive. CT head did not show any acute. As per the history provided by patient's husband to the ER physician patient may have taken extra dose of Soma around 4 more tablets than usual and Ambien couple more with alcohol. Patient alcohol level was found to be elevated. Tylenol levels and urine drug screen and salicylates were negative. Patient at this time is still quite encephalopathic  and minimally responsive to painful stimuli. Pupils are reacting to light. Patient has been admitted for further management. Patient did not respond much to Narcan.   Review of Systems: As presented in the history of  presenting illness, rest negative.  HPI Elements:   Location:  Depression, anxiety and alcohol intoxication. Quality:  Poor insight, judgment and impulse control. Severity:  Alcohol intoxication and unknown over dose of prescription medication. Timing:  Chronic depression and fibromyalgia. Duration:  Few months. Context:   intentional overdose and there alcohol intoxication.  Past Psychiatric History: Past Medical History  Diagnosis Date  . Hypertension   . Depression   . Diabetes mellitus   . Insomnia   . Glaucoma   . Asthma     as a child  . Pneumothorax     secondary to pneumonia    reports that she has been smoking Cigarettes.  She has been smoking about 0.00 packs per day. She has never used smokeless tobacco. She reports that she drinks about 5.0 oz of alcohol per week. She reports that she does not use illicit drugs. Family History  Problem Relation Age of Onset  . Arthritis    . Diabetes Father   . Diabetes Paternal Grandfather   . Hyperlipidemia    . Hypertension    . Stroke    . Heart disease    . Heart attack Paternal Grandmother      Living Arrangements: Spouse/significant other   Abuse/Neglect [facility]) Physical Abuse: Denies Verbal Abuse: Denies Sexual Abuse: Denies Allergies:   Allergies  Allergen Reactions  . Milk-Related Compounds     Lactose Intolerant  . Morphine And Related   . Oxycodone     Itch    ACT Assessment Complete:  No Objective: Blood pressure 144/82, pulse 92, temperature 98.1 F (36.7 C), temperature source Oral, resp. rate 22, height _0  (1.651 m), last menstrual period 09/28/2012, SpO2 97 %.There is no weight on file to calculate BMI. Results for orders placed or performed during the hospital encounter of 03/03/14 (from the past 72 hour(s))  Comprehensive metabolic panel     Status: Abnormal   Collection Time: 03/03/14 10:48 PM  Result Value Ref Range   Sodium 141 137 - 147 mEq/L   Potassium 4.2 3.7 - 5.3 mEq/L   Chloride  103 96 - 112 mEq/L   CO2 24 19 - 32 mEq/L   Glucose, Bld 152 (H) 70 - 99 mg/dL   BUN 5 (L) 6 - 23 mg/dL   Creatinine, Ser 0.74 0.50 - 1.10 mg/dL   Calcium 8.6 8.4 - 10.5 mg/dL   Total Protein 7.2 6.0 - 8.3 g/dL   Albumin 3.5 3.5 - 5.2 g/dL   AST 22 0 - 37 U/L   ALT 19 0 - 35 U/L   Alkaline Phosphatase 94 39 - 117 U/L   Total Bilirubin <0.2 (L) 0.3 - 1.2 mg/dL   GFR calc non Af Amer >90 >90 mL/min   GFR calc Af Amer >90 >90 mL/min    Comment: (NOTE) The eGFR has been calculated using the CKD EPI equation. This calculation has not been validated in all clinical situations. eGFR's persistently <90 mL/min signify possible Chronic Kidney Disease.    Anion gap 14 5 - 15  CBC with Differential     Status: Abnormal   Collection Time: 03/03/14 10:48 PM  Result Value Ref Range   WBC 6.5 4.0 - 10.5 K/uL   RBC 4.65 3.87 - 5.11 MIL/uL  Hemoglobin 11.7 (L) 12.0 - 15.0 g/dL   HCT 36.3 36.0 - 46.0 %   MCV 78.1 78.0 - 100.0 fL   MCH 25.2 (L) 26.0 - 34.0 pg   MCHC 32.2 30.0 - 36.0 g/dL   RDW 19.1 (H) 11.5 - 15.5 %   Platelets 273 150 - 400 K/uL   Neutrophils Relative % 58 43 - 77 %   Neutro Abs 3.8 1.7 - 7.7 K/uL   Lymphocytes Relative 30 12 - 46 %   Lymphs Abs 2.0 0.7 - 4.0 K/uL   Monocytes Relative 6 3 - 12 %   Monocytes Absolute 0.4 0.1 - 1.0 K/uL   Eosinophils Relative 5 0 - 5 %   Eosinophils Absolute 0.3 0.0 - 0.7 K/uL   Basophils Relative 1 0 - 1 %   Basophils Absolute 0.1 0.0 - 0.1 K/uL  Acetaminophen level     Status: None   Collection Time: 03/03/14 10:48 PM  Result Value Ref Range   Acetaminophen (Tylenol), Serum <15.0 10 - 30 ug/mL    Comment:        THERAPEUTIC CONCENTRATIONS VARY SIGNIFICANTLY. A RANGE OF 10-30 ug/mL MAY BE AN EFFECTIVE CONCENTRATION FOR MANY PATIENTS. HOWEVER, SOME ARE BEST TREATED AT CONCENTRATIONS OUTSIDE THIS RANGE. ACETAMINOPHEN CONCENTRATIONS >150 ug/mL AT 4 HOURS AFTER INGESTION AND >50 ug/mL AT 12 HOURS AFTER INGESTION ARE OFTEN  ASSOCIATED WITH TOXIC REACTIONS.   Ethanol     Status: Abnormal   Collection Time: 03/03/14 10:48 PM  Result Value Ref Range   Alcohol, Ethyl (B) 261 (H) 0 - 11 mg/dL    Comment:        LOWEST DETECTABLE LIMIT FOR SERUM ALCOHOL IS 11 mg/dL FOR MEDICAL PURPOSES ONLY   Salicylate level     Status: Abnormal   Collection Time: 03/03/14 10:48 PM  Result Value Ref Range   Salicylate Lvl <2.6 (L) 2.8 - 20.0 mg/dL  I-Stat Beta hCG blood, ED ([facility], [facility], [facility] only)     Status: None   Collection Time: 03/03/14 10:54 PM  Result Value Ref Range   I-stat hCG, quantitative <5.0 <5 mIU/mL   Comment 3            Comment:   GEST. AGE      CONC.  (mIU/mL)   <=1 WEEK        5 - 50     2 WEEKS       50 - 500     3 WEEKS       100 - 10,000     4 WEEKS     1,000 - 30,000        FEMALE AND NON-PREGNANT FEMALE:     LESS THAN 5 mIU/mL   I-Stat arterial blood gas, ED     Status: Abnormal   Collection Time: 03/04/14  2:15 AM  Result Value Ref Range   pH, Arterial 7.305 (L) 7.350 - 7.450   pCO2 arterial 47.5 (H) 35.0 - 45.0 mmHg   pO2, Arterial 73.0 (L) 80.0 - 100.0 mmHg   Bicarbonate 23.6 20.0 - 24.0 mEq/L   TCO2 25 0 - 100 mmol/L   O2 Saturation 93.0 %   Acid-base deficit 3.0 (H) 0.0 - 2.0 mmol/L   Patient temperature 98.6 F    Collection site RADIAL, ALLEN'S TEST ACCEPTABLE    Drawn by Operator    Sample type ARTERIAL   Urine rapid drug screen (hosp performed)     Status: None   Collection  Time: 03/04/14  2:23 AM  Result Value Ref Range   Opiates NONE DETECTED NONE DETECTED   Cocaine NONE DETECTED NONE DETECTED   Benzodiazepines NONE DETECTED NONE DETECTED   Amphetamines NONE DETECTED NONE DETECTED   Tetrahydrocannabinol NONE DETECTED NONE DETECTED   Barbiturates NONE DETECTED NONE DETECTED    Comment:        DRUG SCREEN FOR MEDICAL PURPOSES ONLY.  IF CONFIRMATION IS NEEDED FOR ANY PURPOSE, NOTIFY LAB WITHIN 5 DAYS.        LOWEST DETECTABLE LIMITS FOR URINE DRUG SCREEN Drug Class        Cutoff (ng/mL) Amphetamine      1000 Barbiturate      200 Benzodiazepine   373 Tricyclics       428 Opiates          300 Cocaine          300 THC              50   Urinalysis, Routine w reflex microscopic     Status: None   Collection Time: 03/04/14  2:23 AM  Result Value Ref Range   Color, Urine YELLOW YELLOW   APPearance CLEAR CLEAR   Specific Gravity, Urine 1.006 1.005 - 1.030   pH 5.5 5.0 - 8.0   Glucose, UA NEGATIVE NEGATIVE mg/dL   Hgb urine dipstick NEGATIVE NEGATIVE   Bilirubin Urine NEGATIVE NEGATIVE   Ketones, ur NEGATIVE NEGATIVE mg/dL   Protein, ur NEGATIVE NEGATIVE mg/dL   Urobilinogen, UA 0.2 0.0 - 1.0 mg/dL   Nitrite NEGATIVE NEGATIVE   Leukocytes, UA NEGATIVE NEGATIVE    Comment: MICROSCOPIC NOT DONE ON URINES WITH NEGATIVE PROTEIN, BLOOD, LEUKOCYTES, NITRITE, OR GLUCOSE <1000 mg/dL.  MRSA PCR Screening     Status: None   Collection Time: 03/04/14  6:15 AM  Result Value Ref Range   MRSA by PCR NEGATIVE NEGATIVE    Comment:        The GeneXpert MRSA Assay (FDA approved for NASAL specimens only), is one component of a comprehensive MRSA colonization surveillance program. It is not intended to diagnose MRSA infection nor to guide or monitor treatment for MRSA infections.   Glucose, capillary     Status: Abnormal   Collection Time: 03/04/14  6:31 AM  Result Value Ref Range   Glucose-Capillary 108 (H) 70 - 99 mg/dL  Glucose, capillary     Status: Abnormal   Collection Time: 03/04/14  7:42 AM  Result Value Ref Range   Glucose-Capillary 201 (H) 70 - 99 mg/dL   Comment 1 Repeat Test   Troponin I     Status: None   Collection Time: 03/04/14  8:17 AM  Result Value Ref Range   Troponin I <0.30 <0.30 ng/mL    Comment:        Due to the release kinetics of cTnI, a negative result within the first hours of the onset of symptoms does not rule out myocardial infarction with certainty. If myocardial infarction is still suspected, repeat the test at  appropriate intervals.   Ammonia     Status: None   Collection Time: 03/04/14  8:17 AM  Result Value Ref Range   Ammonia 58 11 - 60 umol/L  Comprehensive metabolic panel     Status: Abnormal   Collection Time: 03/04/14  8:17 AM  Result Value Ref Range   Sodium 145 137 - 147 mEq/L   Potassium 4.5 3.7 - 5.3 mEq/L   Chloride 108 96 - 112  mEq/L   CO2 21 19 - 32 mEq/L   Glucose, Bld 115 (H) 70 - 99 mg/dL   BUN 6 6 - 23 mg/dL   Creatinine, Ser 0.58 0.50 - 1.10 mg/dL   Calcium 8.2 (L) 8.4 - 10.5 mg/dL   Total Protein 6.9 6.0 - 8.3 g/dL   Albumin 3.3 (L) 3.5 - 5.2 g/dL   AST 21 0 - 37 U/L   ALT 18 0 - 35 U/L   Alkaline Phosphatase 93 39 - 117 U/L   Total Bilirubin <0.2 (L) 0.3 - 1.2 mg/dL   GFR calc non Af Amer >90 >90 mL/min   GFR calc Af Amer >90 >90 mL/min    Comment: (NOTE) The eGFR has been calculated using the CKD EPI equation. This calculation has not been validated in all clinical situations. eGFR's persistently <90 mL/min signify possible Chronic Kidney Disease.    Anion gap 16 (H) 5 - 15  CBC with Differential     Status: Abnormal   Collection Time: 03/04/14  8:17 AM  Result Value Ref Range   WBC 7.4 4.0 - 10.5 K/uL   RBC 4.42 3.87 - 5.11 MIL/uL   Hemoglobin 11.3 (L) 12.0 - 15.0 g/dL   HCT 35.7 (L) 36.0 - 46.0 %   MCV 80.8 78.0 - 100.0 fL   MCH 25.6 (L) 26.0 - 34.0 pg   MCHC 31.7 30.0 - 36.0 g/dL   RDW 19.2 (H) 11.5 - 15.5 %   Platelets 284 150 - 400 K/uL   Neutrophils Relative % 68 43 - 77 %   Neutro Abs 5.0 1.7 - 7.7 K/uL   Lymphocytes Relative 24 12 - 46 %   Lymphs Abs 1.8 0.7 - 4.0 K/uL   Monocytes Relative 7 3 - 12 %   Monocytes Absolute 0.5 0.1 - 1.0 K/uL   Eosinophils Relative 1 0 - 5 %   Eosinophils Absolute 0.0 0.0 - 0.7 K/uL   Basophils Relative 0 0 - 1 %   Basophils Absolute 0.0 0.0 - 0.1 K/uL   Labs are reviewed and are pertinent for blood alcohol level is 261.  Current Facility-Administered Medications  Medication Dose Route Frequency Provider  Last Rate Last Dose  . 0.9 %  sodium chloride infusion   Intravenous Continuous Rise Patience, MD 100 mL/hr at 03/04/14 0800    . acetaminophen (TYLENOL) tablet 650 mg  650 mg Oral Q6H PRN Rise Patience, MD   650 mg at 03/04/14 6203   Or  . acetaminophen (TYLENOL) suppository 650 mg  650 mg Rectal Q6H PRN Rise Patience, MD      . enoxaparin (LOVENOX) injection 40 mg  40 mg Subcutaneous Q24H Rise Patience, MD      . etomidate (AMIDATE) 2 MG/ML injection           . folic acid (FOLVITE) tablet 1 mg  1 mg Oral Daily Rise Patience, MD      . hydrALAZINE (APRESOLINE) injection 10 mg  10 mg Intravenous Q4H PRN Rise Patience, MD      . insulin aspart (novoLOG) injection 0-9 Units  0-9 Units Subcutaneous Q4H Rise Patience, MD   0 Units at 03/04/14 0615  . lidocaine (cardiac) 100 mg/82m (XYLOCAINE) 20 MG/ML injection 2%           . LORazepam (ATIVAN) tablet 1 mg  1 mg Oral Q6H PRN ARise Patience MD       Or  . LORazepam (  ATIVAN) injection 1 mg  1 mg Intravenous Q6H PRN Rise Patience, MD      . multivitamin with minerals tablet 1 tablet  1 tablet Oral Daily Rise Patience, MD      . ondansetron [facility]) tablet 4 mg  4 mg Oral Q6H PRN Rise Patience, MD       Or  . ondansetron (ZOFRAN) injection 4 mg  4 mg Intravenous Q6H PRN Rise Patience, MD      . rocuronium (ZEMURON) 50 MG/5ML injection           . succinylcholine (ANECTINE) 20 MG/ML injection           . thiamine (VITAMIN B-1) tablet 100 mg  100 mg Oral Daily Rise Patience, MD       Or  . thiamine (B-1) injection 100 mg  100 mg Intravenous Daily Rise Patience, MD        Psychiatric Specialty Exam: Physical Exam as per history and physical   ROS depression, anxiety, restlessness and impulsive behaviors   Blood pressure 144/82, pulse 92, temperature 98.1 F (36.7 C), temperature source Oral, resp. rate 22, height _0  (1.651 m), last menstrual period 09/28/2012,  SpO2 97 %.There is no weight on file to calculate BMI.  General Appearance: Guarded  Eye Contact::  Good  Speech:  Clear and Coherent  Volume:  Normal  Mood:  Anxious, Depressed, Hopeless, Irritable and Worthless  Affect:  Non-Congruent and Depressed  Thought Process:  Disorganized and Tangential  Orientation:  Full (Time, Place, and Person)  Thought Content:  Obsessions and Rumination  Suicidal Thoughts:  Yes.  with intent/plan  Homicidal Thoughts:  No  Memory:  Immediate;   Poor Recent;   Poor  Judgement:  Impaired  Insight:  Lacking  Psychomotor Activity:  Restlessness  Concentration:  Poor  Recall:  Poor  Fund of Knowledge:Fair  Language: Good  Akathisia:  NA  Handed:  Right  AIMS (if indicated):     Assets:  Agricultural consultant Housing Leisure Time Physical Health Resilience Social Support  Sleep:      Musculoskeletal: Strength & Muscle Tone: within normal limits Gait & Station: normal Patient leans: N/A  Treatment Plan Summary: Daily contact with patient to assess and evaluate symptoms and progress in treatment Medication management  Cymbalta 60 mg daily for depression and Klonopin 1 mg 2 times daily for anxiety and restlessness   Monty Spicher,JANARDHAHA R. 03/04/2014 10:11 AM

## 2014-03-04 NOTE — Plan of Care (Signed)
Problem: Discharge Progression Outcomes Goal: Activity appropriate for discharge plan Outcome: Completed/Met Date Met:  03/04/14     

## 2014-03-04 NOTE — Plan of Care (Signed)
Problem: Consults Goal: General Medical Patient Education See Patient Education Module for specific education. Outcome: Completed/Met Date Met:  03/04/14     

## 2014-03-04 NOTE — Plan of Care (Signed)
Problem: Consults Goal: Skin Care Protocol Initiated - if Braden Score 18 or less If consults are not indicated, leave blank or document N/A Outcome: Not Applicable Date Met:  47/15/80

## 2014-03-04 NOTE — Plan of Care (Signed)
Problem: Phase III Progression Outcomes Goal: IV/normal saline lock discontinued Outcome: Completed/Met Date Met:  03/04/14     

## 2014-03-04 NOTE — Progress Notes (Signed)
Patient refused Klonopin after packaging was already opened. Wasted medication in sink at 1500. Tiney Rouge, RN witnessed waste.

## 2014-03-04 NOTE — Plan of Care (Signed)
Problem: Consults Goal: Diabetes Guidelines if Diabetic/Glucose > 140 If diabetic or lab glucose is > 140 mg/dl - Initiate Diabetes/Hyperglycemia Guidelines & Document Interventions  Outcome: Not Applicable Date Met:  03/04/14     

## 2014-03-04 NOTE — Plan of Care (Signed)
Problem: Phase II Progression Outcomes Goal: Discharge plan established Outcome: Completed/Met Date Met:  03/04/14

## 2014-03-04 NOTE — ED Notes (Signed)
RT notified

## 2014-03-04 NOTE — Telephone Encounter (Signed)
Referral for Rheumatology faxed to Dr Amil Amen office at [phone].

## 2014-03-04 NOTE — ED Provider Notes (Signed)
CSN: [id number]     Arrival date & time 03/03/14  2234 History   First MD Initiated Contact with Patient 03/03/14 2248     Chief Complaint  Patient presents with  . Altered Mental Status     (Consider location/radiation/quality/duration/timing/severity/associated sxs/prior Treatment) HPI 53 year old female with history of depression, fibromyalgia who presents after supposedly overdose. Patient has a history of drinking heavily and misuse of medications. She was found by her husband when he came home from work tonight unresponsive. EMS was called and found her with a GCS of 4, apparently hypoxic to the mid 80s on room air. A nasal trumpet was passed she was placed on high flow oxygen and transported here. EMS reports that she apparently had been vomiting, but they were able to suction her airway. No further history is available as the patient is nonverbal.  Past Medical History  Diagnosis Date  . Hypertension   . Depression   . Diabetes mellitus   . Insomnia   . Glaucoma   . Asthma     as a child  . Pneumothorax     secondary to pneumonia   Past Surgical History  Procedure Laterality Date  . Tubal ligation    . Knee surgery      left  . Mini gastric bypass  2006  . Tonsillectomy    . Abdominoplasty  2007   Family History  Problem Relation Age of Onset  . Arthritis    . Diabetes Father   . Diabetes Paternal Grandfather   . Hyperlipidemia    . Hypertension    . Stroke    . Heart disease    . Heart attack Paternal Grandmother    History  Substance Use Topics  . Smoking status: Current Every Day Smoker    Types: Cigarettes  . Smokeless tobacco: Never Used  . Alcohol Use: 5.0 oz/week    10 drink(s) per week   OB History    Gravida Para Term Preterm AB TAB SAB Ectopic Multiple Living   0 0 0 0 0 0 0 0 0 0      Review of Systems  Unable to perform ROS: Mental status change      Allergies  Morphine and related and Oxycodone  Home Medications   Prior to  Admission medications   Medication Sig Start Date End Date Taking? Authorizing Provider  Carisoprodol (SOMA PO) Take by mouth as needed.    Historical Provider, MD  carisoprodol (SOMA) 350 MG tablet Take 1 tablet (350 mg total) by mouth 4 (four) times daily as needed for muscle spasms. 03/03/14   Elby Showers, MD  DULoxetine (CYMBALTA) 30 MG capsule Take 3 tabs daily as needed 12/22/13   Elby Showers, MD  EPINEPHrine (EPIPEN) 0.3 mg/0.3 mL SOAJ injection Inject 0.3 mLs (0.3 mg total) into the muscle once. 03/24/13   Elby Showers, MD  estradiol (VIVELLE-DOT) 0.1 MG/24HR patch APPLY ONE PATCH TO THE SKIN TWICE A WEEK 09/04/13   Lyman Speller, MD  meloxicam (MOBIC) 15 MG tablet Take 1 tablet (15 mg total) by mouth daily. 03/03/14   Elby Showers, MD  metFORMIN (GLUCOPHAGE) 500 MG tablet take 1 tablet by mouth once daily 10/24/13   Elby Showers, MD  NONFORMULARY OR COMPOUNDED ITEM Topical testosterone 1% cream.  Apply 1/4 tsp to inner thighs three times weekly.  Disp:  60 grams 09/04/13   Lyman Speller, MD  progesterone (PROMETRIUM) 200 MG capsule TAKE ONE CAPSULE BY  MOUTH ONCE DAILY 09/04/13   Lyman Speller, MD  traMADol (ULTRAM) 50 MG tablet TAKE ONE TABLET BY MOUTH THREE TIMES DAILY 03/03/14   Elby Showers, MD  valsartan-hydrochlorothiazide (DIOVAN-HCT) 160-12.5 MG per tablet Take 1 tablet by mouth daily. 03/18/13   Elby Showers, MD  zolpidem (AMBIEN) 10 MG tablet Take 1 tablet (10 mg total) by mouth at bedtime as needed for sleep. 03/03/14 04/02/14  Elby Showers, MD   SpO2 94%  LMP 09/28/2012 Physical Exam  Constitutional: She appears well-developed and well-nourished.  Ill in appearance  HENT:  Head: Normocephalic and atraumatic.  Dried vomit around her mouth  Eyes: Pupils are equal, round, and reactive to light.  Neck: Normal range of motion. Neck supple.  Cardiovascular: Normal rate and regular rhythm.   Pulmonary/Chest:  Bradypnea, lung sounds clear bilaterally, nasal  trumpet in place, nonrebreather mask in place  Abdominal: Soft. She exhibits no distension. There is no tenderness.  Musculoskeletal: She exhibits no edema or tenderness.  Neurological: GCS eye subscore is 2. GCS verbal subscore is 1. GCS motor subscore is 5.  Skin: Skin is dry.  Nursing note and vitals reviewed.   ED Course  Procedures (including critical care time) Labs Review Labs Reviewed  COMPREHENSIVE METABOLIC PANEL - Abnormal; Notable for the following:    Glucose, Bld 152 (*)    BUN 5 (*)    Total Bilirubin <0.2 (*)    All other components within normal limits  CBC WITH DIFFERENTIAL - Abnormal; Notable for the following:    Hemoglobin 11.7 (*)    MCH 25.2 (*)    RDW 19.1 (*)    All other components within normal limits  ETHANOL - Abnormal; Notable for the following:    Alcohol, Ethyl (B) 261 (*)    All other components within normal limits  SALICYLATE LEVEL - Abnormal; Notable for the following:    Salicylate Lvl <4.9 (*)    All other components within normal limits  ACETAMINOPHEN LEVEL  URINE RAPID DRUG SCREEN (HOSP PERFORMED)  URINALYSIS, ROUTINE W REFLEX MICROSCOPIC  I-STAT BETA HCG BLOOD, ED ([facility], [facility], [facility] ONLY)    Imaging Review No results found.   EKG Interpretation None      MDM   Final diagnoses:  Unresponsive   53 year old female with polysubstance overdose. Additional history obtained from husband after her arrival. Patient apparently took 2 Ambien, and unknown quantity of tramadol, for Soma of an unknown strength, and an unknown quantity of other medications. Initially her GCS was 8, and she did not appear to be adequately protecting her airway. We made preparations to intubate her, however she began to wake up, and intubation was delayed. She has gradually improved in mental status, and is now able to follow commands, and will open eyes to voice.  Ethanol level >200, random acetaminophen level less than 15, salicylate level less than 2.  CT of the  head was performed due to unknown circumstances surrounding her altered mental status initially, a concern for trauma, but no evidence of acute injury. Chest x-ray obtained due to concern for possible aspiration.  At this point is unknown the intent of her overdose, but she will need to be admitted for close monitoring observation overnight.  Leata Mouse, MD 03/04/14 0104  Sharyon Cable, MD 03/04/14 [phone]

## 2014-03-04 NOTE — Plan of Care (Signed)
Problem: Phase II Progression Outcomes Goal: Obtain order to discontinue catheter if appropriate Outcome: Not Applicable Date Met:  03/04/14     

## 2014-03-04 NOTE — ED Notes (Signed)
Pt more awake - requesting to ambulate to restroom, fall prevention discussed with pt. Pt unable to recall series of events of yesterday evening. Requesting to go home, states she wants to know when she will be able to go home. Discussed plan of care w/ pt and dangers of decreased oxygen level - pt verbalized understanding. Pt denies SI/HI

## 2014-03-04 NOTE — Plan of Care (Signed)
Problem: Discharge Progression Outcomes Goal: Hemodynamically stable Outcome: Completed/Met Date Met:  03/04/14

## 2014-03-04 NOTE — Plan of Care (Signed)
Problem: Phase II Progression Outcomes Goal: IV changed to normal saline lock Outcome: Completed/Met Date Met:  03/04/14     

## 2014-03-04 NOTE — Plan of Care (Signed)
Problem: Discharge Progression Outcomes Goal: Other Discharge Outcomes/Goals Outcome: Not Applicable Date Met:  03/04/14     

## 2014-03-04 NOTE — Plan of Care (Signed)
Problem: Phase I Progression Outcomes Goal: OOB as tolerated unless otherwise ordered Outcome: Completed/Met Date Met:  03/04/14     

## 2014-03-04 NOTE — Telephone Encounter (Signed)
Patient called this morning from the hospital saying that she was disturbed that there was a sitter with her and security outside her room. She asked that I'll call her. Says that she is disturbed that she is being evaluated by psychiatrist. Says she could not understand the psychiatrist. Wants to see another psychiatrist. Doesn't understand why she is being evaluated or involuntarily committed. Explained to her that we took medication overdoses very seriously and that I specifically had requested that a psychiatrist be called to evaluate her. She says she simply made a mistake and acted stupidly. Wants to be discharged. I told her that we would depend upon  psychiatric consultation to guide [place] and make further recommendations. Also told her I was mailing her information on fibromyalgia syndrome and making her an appointment to see Dr. Amil Amen. Her rheumatology studies are pending.

## 2014-03-04 NOTE — Progress Notes (Signed)
Admission note:  Arrival Method: wheelchair Mental Orientation: alert & oriented x 4  Telemetry: not ordered  IV: pt does not have IV access; was told in report that MD was aware.  Pain: pt rates her pain as a 7 and states it is generalized pain all over her body; pt has fibromyalgia   Admission: Completed and admission orders have been written  6E Orientation: Patient has been oriented to the unit, staff and to the room.  Family: At the bedside; husband. Pt also has suicide sitter

## 2014-03-04 NOTE — Plan of Care (Signed)
Problem: Phase I Progression Outcomes Goal: Pain controlled with appropriate interventions Outcome: Completed/Met Date Met:  03/04/14     

## 2014-03-04 NOTE — Progress Notes (Signed)
UR Completed.  [phone]  

## 2014-03-04 NOTE — Plan of Care (Signed)
Problem: Phase III Progression Outcomes Goal: Other Phase III Outcomes/Goals Outcome: Not Applicable Date Met:  38/37/79

## 2014-03-04 NOTE — Plan of Care (Signed)
Problem: Phase III Progression Outcomes Goal: Voiding independently Outcome: Completed/Met Date Met:  03/04/14

## 2014-03-04 NOTE — Progress Notes (Signed)
PATIENT DETAILS Name: Amber Mata Age: 53 y.o. Sex: female Date of Birth: 1960-06-11 Admit Date: 03/03/2014 Admitting Physician Rise Patience, MD OFH:QRFXJO,ITGP J, MD  Subjective: Awake/alert-wants to go home. Does not "remember what happened" yesterday-will not let me talk with her husband.Per H&P 4 tablets of soma missing/2 tabs of ambien missing-had it filled yesterday afternoon around 5 pm!!  Assessment/Plan: Principal Problem:   Acute encephalopathy:secondary to polypharmacy-ETOH/Soma/Ambien-this has resolved. Question is why she tooks 4 tabs of soma/2 tabs of ambien right after filling her prescription. She is not very open about it-when asked repeatedly she does not give straight answers. Initially she did not want to me to talk with her husband.I initially felt that this is polypharmacy, and thought I could discharge patient later today. But this MD then called her PCP Dr Evangeline Gula made changes to her medications yesterday to get a background history, per Dr Rogers Blocker called the office 3-4 times yesterday and with numerous questions. She suggested that patient will benefit from a psych consult prior to discharge, to make sure she is not a danger to herself. I subsequently saw the patient, she was aggressive, uncooperative and just demanding discharge. She was still not being forthcoming as to why she was took all the above pills at once and mixed it with alcohol. Please note her husband was in the room the second time I walked in, I asked the patient if it was ok to talk, before proceeding with out discussion. Although patient denies any suicidal ideation/intent-I have a difficult time in putting together what/why she took all those pills at one, she is also uncooperative with me-I therefore will ask Psych to see her. She was initially very reluctant to see a psychiatrist, but then reluctantly agreed to stay. I have asked the RN to place a sitter at bedside till seen by  Psych. I have asked RN not to let her leave till seen by Psych  Note-RN present when I saw patient in both occasions.  Active Problems:   Drug overdose:see above.    Fibromyalgia:has appt with Dr Amil Amen. Will need to minimize meds given this presentation. Will continue Cymbalta on discharge.    Diabetes mellitus type 2, controlled:start SSI while inpatient    HTN: will resume all anti-hypertensive medications on discharge. BP currently controlled-follow and restart meds when able.    Normocytic anemia   Disposition: Remain inpatient-till seen by Psych MD  Antibiotics:  None  Anti-infectives    None      DVT Prophylaxis: Prophylactic Lovenox   Code Status: Full code   Family Communication Husband at bedside-during our second discussion.   Procedures:  none  CONSULTS:  psychiatry  Time spent 40 minutes-which includes 50% of the time with face-to-face with patient/ family and coordinating care related to the above assessment and plan.  MEDICATIONS: Scheduled Meds: . enoxaparin (LOVENOX) injection  40 mg Subcutaneous Q24H  . etomidate      . folic acid  1 mg Oral Daily  . insulin aspart  0-9 Units Subcutaneous Q4H  . lidocaine (cardiac) 100 mg/88ml      . multivitamin with minerals  1 tablet Oral Daily  . rocuronium      . succinylcholine      . thiamine  100 mg Oral Daily   Or  . thiamine  100 mg Intravenous Daily   Continuous Infusions:  PRN Meds:.acetaminophen **OR** acetaminophen, hydrALAZINE, LORazepam **OR** LORazepam, ondansetron **OR** ondansetron (ZOFRAN) IV  PHYSICAL EXAM: Vital signs in last 24 hours: Filed Vitals:   03/04/14 0620 03/04/14 0700 03/04/14 0800 03/04/14 0900  BP: 118/68 124/78 128/79 144/82  Pulse: 87 100 92   Temp: 98.1 F (36.7 C)     TempSrc: Oral     Resp: 14 13 12 22   Height: 5\' 5"  (1.651 m)     SpO2: 98% 91% 97%     Weight change:  There were no vitals filed for this visit. There is no weight on file to  calculate BMI.   Gen Exam: Awake and alert with clear speech.   Neck: Supple, No JVD.   Chest: B/L Clear.   CVS: S1 S2 Regular, no murmurs.  Abdomen: soft, BS +, non tender, non distended.  Extremities: no edema, lower extremities warm to touch. Neurologic: Non Focal.   Skin: No Rash.   Wounds: N/A.    Intake/Output from previous day:  Intake/Output Summary (Last 24 hours) at 03/04/14 1024 Last data filed at 03/04/14 0800  Gross per 24 hour  Intake   1220 ml  Output      0 ml  Net   1220 ml     LAB RESULTS: CBC  Recent Labs Lab 03/03/14 2248 03/04/14 0817  WBC 6.5 7.4  HGB 11.7* 11.3*  HCT 36.3 35.7*  PLT 273 284  MCV 78.1 80.8  MCH 25.2* 25.6*  MCHC 32.2 31.7  RDW 19.1* 19.2*  LYMPHSABS 2.0 1.8  MONOABS 0.4 0.5  EOSABS 0.3 0.0  BASOSABS 0.1 0.0    Chemistries   Recent Labs Lab 03/03/14 1137 03/03/14 2248 03/04/14 0817  NA 136 141 145  K 4.1 4.2 4.5  CL 101 103 108  CO2 26 24 21   GLUCOSE 100* 152* 115*  BUN 8 5* 6  CREATININE 0.67 0.74 0.58  CALCIUM 8.9 8.6 8.2*    CBG:  Recent Labs Lab 03/04/14 0631 03/04/14 0742  GLUCAP 108* 201*    GFR Estimated Creatinine Clearance: 94.9 mL/min (by C-G formula based on Cr of 0.58).  Coagulation profile No results for input(s): INR, PROTIME in the last 168 hours.  Cardiac Enzymes  Recent Labs Lab 03/04/14 0817  TROPONINI <0.30    Invalid input(s): POCBNP No results for input(s): DDIMER in the last 72 hours. No results for input(s): HGBA1C in the last 72 hours.  Recent Labs  03/03/14 1104  CHOL 128   No results for input(s): TSH, T4TOTAL, T3FREE, THYROIDAB in the last 72 hours.  Invalid input(s): FREET3 No results for input(s): VITAMINB12, FOLATE, FERRITIN, TIBC, IRON, RETICCTPCT in the last 72 hours. No results for input(s): LIPASE, AMYLASE in the last 72 hours.  Urine Studies No results for input(s): UHGB, CRYS in the last 72 hours.  Invalid input(s): UACOL, UAPR, USPG, UPH,  UTP, UGL, UKET, UBIL, UNIT, UROB, ULEU, UEPI, UWBC, URBC, UBAC, CAST, UCOM, BILUA  MICROBIOLOGY: Recent Results (from the past 240 hour(s))  MRSA PCR Screening     Status: None   Collection Time: 03/04/14  6:15 AM  Result Value Ref Range Status   MRSA by PCR NEGATIVE NEGATIVE Final    Comment:        The GeneXpert MRSA Assay (FDA approved for NASAL specimens only), is one component of a comprehensive MRSA colonization surveillance program. It is not intended to diagnose MRSA infection nor to guide or monitor treatment for MRSA infections.     RADIOLOGY STUDIES/RESULTS: Ct Head Wo Contrast  03/04/2014   CLINICAL DATA:  53 year old female  with altered mental status. Initial encounter.  EXAM: CT HEAD WITHOUT CONTRAST  TECHNIQUE: Contiguous axial images were obtained from the base of the skull through the vertex without intravenous contrast.  COMPARISON:  None.  FINDINGS: No intracranial abnormalities are identified, including mass lesion or mass effect, hydrocephalus, extra-axial fluid collection, midline shift, hemorrhage, or acute infarction.  The visualized bony calvarium is unremarkable.  Nasal intubation noted.  IMPRESSION: No intracranial abnormalities.   Electronically Signed   By: Hassan Rowan M.D.   On: 03/04/2014 00:08   Dg Chest Portable 1 View  03/04/2014   CLINICAL DATA:  Altered mental status.  Initial encounter.  EXAM: PORTABLE CHEST - 1 VIEW  COMPARISON:  None.  FINDINGS: The cardiomediastinal silhouette is unremarkable.  This is a low volume film with bibasilar atelectasis.  There is no evidence of focal airspace disease, pulmonary edema, suspicious pulmonary nodule/mass, pleural effusion, or pneumothorax. No acute bony abnormalities are identified.  IMPRESSION: Low volume film with bibasilar atelectasis.   Electronically Signed   By: Hassan Rowan M.D.   On: 03/04/2014 00:28    Oren Binet, MD  [facility] Pager:[phone]  If 7PM-7AM, please contact  night-coverage [email or web address] Password [email or web address] 03/04/2014, 10:24 AM   LOS: 1 day

## 2014-03-04 NOTE — Clinical Social Work Psych Note (Signed)
CSW consulted to IVC patient due to MD request. Pt IVC as of 03/04/2014.  CSW consulted for psychiatric inpatient placement at time of discharge. CSW completed the following referrals: [place] [phone]) 12/2: Referral made. [facility] [phone]) 12/2: Referral made. [facility] [phone]) 12/2: Referral made.  Lubertha Sayres, [place] [phone]) Licensed Clinical Social Worker Orthopedics [phone]) and Surgical [phone])

## 2014-03-04 NOTE — Plan of Care (Signed)
Problem: Discharge Progression Outcomes Goal: Pain controlled with appropriate interventions Outcome: Completed/Met Date Met:  03/04/14     

## 2014-03-05 ENCOUNTER — Ambulatory Visit (HOSPITAL_COMMUNITY): Payer: BC Managed Care – PPO

## 2014-03-05 ENCOUNTER — Telehealth: Payer: Self-pay

## 2014-03-05 ENCOUNTER — Telehealth: Payer: Self-pay | Admitting: Internal Medicine

## 2014-03-05 DIAGNOSIS — T50901A Poisoning by unspecified drugs, medicaments and biological substances, accidental (unintentional), initial encounter: Secondary | ICD-10-CM

## 2014-03-05 DIAGNOSIS — E119 Type 2 diabetes mellitus without complications: Secondary | ICD-10-CM

## 2014-03-05 LAB — CYCLIC CITRUL PEPTIDE ANTIBODY, IGG

## 2014-03-05 LAB — GLUCOSE, CAPILLARY
GLUCOSE-CAPILLARY: 120 mg/dL — AB (ref 70–99)
GLUCOSE-CAPILLARY: 121 mg/dL — AB (ref 70–99)
Glucose-Capillary: 126 mg/dL — ABNORMAL HIGH (ref 70–99)

## 2014-03-05 MED ORDER — PROGESTERONE MICRONIZED 200 MG PO CAPS
200.0000 mg | ORAL_CAPSULE | Freq: Every day | ORAL | Status: DC
  Filled 2014-03-05: qty 1

## 2014-03-05 MED ORDER — FOLIC ACID 1 MG PO TABS
1.0000 mg | ORAL_TABLET | Freq: Every day | ORAL | Status: DC
Start: 2014-03-05 — End: 2020-02-16

## 2014-03-05 MED ORDER — KETOROLAC TROMETHAMINE 30 MG/ML IJ SOLN: 30.0000 mg | Freq: Once | INTRAMUSCULAR | Status: DC

## 2014-03-05 MED ORDER — TRAMADOL HCL 50 MG PO TABS: 50.0000 mg | ORAL_TABLET | Freq: Four times a day (QID) | ORAL | Status: DC | PRN

## 2014-03-05 MED ORDER — KETOROLAC TROMETHAMINE 10 MG PO TABS
10.0000 mg | ORAL_TABLET | Freq: Once | ORAL | Status: AC
  Administered 2014-03-05: 10 mg via ORAL
  Filled 2014-03-05: qty 1

## 2014-03-05 MED ORDER — THIAMINE HCL 100 MG PO TABS: 100.0000 mg | ORAL_TABLET | Freq: Every day | ORAL | Status: DC

## 2014-03-05 MED ORDER — ESTRADIOL 0.1 MG/24HR TD PTWK
0.1000 mg | MEDICATED_PATCH | TRANSDERMAL | Status: DC
  Filled 2014-03-05: qty 1

## 2014-03-05 MED ORDER — METFORMIN HCL 500 MG PO TABS
500.0000 mg | ORAL_TABLET | Freq: Every day | ORAL | Status: DC
  Filled 2014-03-05: qty 1

## 2014-03-05 MED ORDER — ADULT MULTIVITAMIN W/MINERALS CH: 1.0000 | ORAL_TABLET | Freq: Every day | ORAL | Status: AC

## 2014-03-05 MED ORDER — MELOXICAM 15 MG PO TABS
15.0000 mg | ORAL_TABLET | Freq: Every day | ORAL | Status: DC
  Administered 2014-03-05: 15 mg via ORAL
  Filled 2014-03-05: qty 1

## 2014-03-05 NOTE — [facility] Counselor (Signed)
Per Dr Aldine Contes request, TTS provider will do consult on pt. Per Ree Kida, pt refuses to speak w/ Dr. Argentina Donovan again. Catalina Pizza NP at [facility] is Multimedia programmer currently.  Arnold Long, [place] Assessment Counselor

## 2014-03-05 NOTE — Care Management Note (Signed)
CARE MANAGEMENT NOTE 03/05/2014  Patient:  Amber Mata, Amber Mata   Account Number:  [id number]  Date Initiated:  03/04/2014  Documentation initiated by:  [facility]  Subjective/Objective Assessment:   OD     Action/Plan:   Pt d/c to home.  03/05/2014 Pt d/c to home with outpatient followup for psychiatric needs.   Anticipated DC Date:  03/05/2014   Anticipated DC Plan:  HOME/SELF CARE  In-house referral  Clinical Social Worker      DC Planning Services  CM consult      Choice offered to / List presented to:             Status of service:  Completed, signed off Medicare Important Message given?   (If response is "NO", the following Medicare IM given date fields will be blank) Date Medicare IM given:   Medicare IM given by:   Date Additional Medicare IM given:   Additional Medicare IM given by:    Discharge Disposition:  HOME/SELF CARE  Per UR Regulation:  Reviewed for med. necessity/level of care/duration of stay  If discussed at [place] of Stay Meetings, dates discussed:    Comments:  ContactMorgyn, [place] [phone]  [phone]

## 2014-03-05 NOTE — Discharge Summary (Signed)
Physician Discharge Summary  Amber Mata ELF:[id number] DOB: 09/05/1960 DOA: 03/03/2014  PCP: Elby Showers, MD  Admit date: 03/03/2014 Discharge date: 03/05/2014  Time spent: 45 minutes  Recommendations for Outpatient Follow-up:  Patient will be discharged home. She is to follow-up with her primary care physician at discharge. Patient also follow-up with psychiatric services, [facility]. Patient is to continue taking her medications as prescribed. Patient to follow a carb modified diet. Patient was instructed to abstain from alcohol use with her medications. Patient may resume normal activity as tolerated.  Discharge Diagnoses:  Principal Problem:   Acute encephalopathy Active Problems:   Drug overdose   Diabetes mellitus type 2, controlled   Normocytic anemia  Discharge Condition: Stable   Diet recommendation: Carb modified  Filed Weights   03/04/14 2010  Weight: 99.338 kg (219 lb)    History of present illness:  By Dr. Gean Birchwood on 03/04/2014 Amber Maryland Flamenco is a 53 y.o. female with history of diabetes mellitus and hyperlipidemia and depression and chronic pain was brought to the ER the patient has been found that patient was unresponsive after he came back from work last evening around 4 PM. In the ER patient was found to have a GCS of 7 and initially ER physician was planning to intubate but by then patient became digital responsive. CT head did not show any acute. As per the history provided by patient's husband to the ER physician patient may have taken extra dose of Soma around 4 more tablets than usual and Ambien couple more with alcohol. Patient alcohol level was found to be elevated. Tylenol levels and urine drug screen and salicylates were negative. Patient at this time is still quite encephalopathic and minimally responsive to painful stimuli. Pupils are reacting to light. Patient has been admitted for further management. Patient did not respond much to  Narcan.  Hospital Course:  Acute encephalopathy -Likely secondary to polypharmacy along with alcohol use, Soma and Ambien -Resolved -Patient is follow-up with Dr. Renold Genta her primary care physician upon discharge for medication reconciliation. -Psychiatry consulted appreciated, patient was initially placed under IVC -Patient at this time is not present or harm to herself or those around her, she denies any suicidal or homicidal ideations -Consulted psychiatry psychiatry to reevaluate patient and felt patient could follow up with outpatient followup for anxiety and depression. -At this point patient is medically cleared and stable  Drug overdose -Likely unintentional  Fibromyalgia /chronic pain -Continue home medications, patient will need to follow-up with Dr. Amil Amen upon discharge   Diabetes mellitus, type II -Continue metformin  Normocytic anemia -hemoglobin remained stable  Procedures: None   Consultations: Psychiatry   Discharge Exam: Filed Vitals:   03/05/14 1010  BP: 172/86  Pulse: 80  Temp: 98 F (36.7 C)  Resp: 18     General: Well developed, well nourished, NAD, appears stated age  HEENT: NCAT,  mucous membranes moist.  Cardiovascular: S1 S2 auscultated, no rubs, murmurs or gallops. Regular rate and rhythm.  Respiratory: Clear to auscultation bilaterally with equal chest rise  Abdomen: Soft, nontender, nondistended, + bowel sounds  Extremities: warm dry without cyanosis clubbing or edema  Neuro: AAno focal deficitsin: Without rashes exudates or nodules  Psych: anxious affect and demeanor  Discharge Instructions      Discharge Instructions    Discharge instructions    Complete by:  As directed   Patient will be discharged home. She is to follow-up with her primary care physician at discharge. Patient also follow-up  with psychiatric services, [facility]. Patient is to continue taking her medications as prescribed. Patient to follow a  carb modified diet. Patient was instructed to abstain from alcohol use with her medications. Patient may resume normal activity as tolerated.            Medication List    STOP taking these medications        carisoprodol 350 MG tablet  Commonly known as:  SOMA     zolpidem 10 MG tablet  Commonly known as:  AMBIEN      TAKE these medications        CO Q 10 PO  Take 1 capsule by mouth daily.     DULoxetine 30 MG capsule  Commonly known as:  CYMBALTA  Take 3 tabs daily as needed     EPINEPHrine 0.3 mg/0.3 mL Soaj injection  Commonly known as:  EPIPEN  Inject 0.3 mLs (0.3 mg total) into the muscle once.     estradiol 0.1 MG/24HR patch  Commonly known as:  VIVELLE-DOT  APPLY ONE PATCH TO THE SKIN TWICE A WEEK     folic acid 1 MG tablet  Commonly known as:  FOLVITE  Take 1 tablet (1 mg total) by mouth daily.     meloxicam 15 MG tablet  Commonly known as:  MOBIC  Take 1 tablet (15 mg total) by mouth daily.     metFORMIN 500 MG tablet  Commonly known as:  GLUCOPHAGE  take 1 tablet by mouth once daily     multivitamin with minerals Tabs tablet  Take 1 tablet by mouth daily.     NONFORMULARY OR COMPOUNDED ITEM  Topical testosterone 1% cream.  Apply 1/4 tsp to inner thighs three times weekly.  Disp:  60 grams     omega-3 acid ethyl esters 1 G capsule  Commonly known as:  LOVAZA  Take 1 g by mouth 3 (three) times daily.     PRESCRIPTION MEDICATION  Apply 1 application topically daily as needed (Magnesium).     progesterone 200 MG capsule  Commonly known as:  PROMETRIUM  TAKE ONE CAPSULE BY MOUTH ONCE DAILY     thiamine 100 MG tablet  Take 1 tablet (100 mg total) by mouth daily.     traMADol 50 MG tablet  Commonly known as:  ULTRAM  TAKE ONE TABLET BY MOUTH THREE TIMES DAILY     valsartan-hydrochlorothiazide 160-12.5 MG per tablet  Commonly known as:  DIOVAN-HCT  Take 1 tablet by mouth daily.       Allergies  Allergen Reactions  . Milk-Related  Compounds     Lactose Intolerant  . Morphine And Related   . Oxycodone     Itch   Follow-up Information    Follow up with Elby Showers, MD. Schedule an appointment as soon as possible for a visit in 1 week.   Specialty:  Internal Medicine   Why:  Hospital followup   Contact information:   [id number] [id number] [phone]       Follow up with [facility].   Specialty:  Behavioral Health   Why:  As needed   Contact information:   [address] [phone]       The results of significant diagnostics from this hospitalization (including imaging, microbiology, ancillary and laboratory) are listed below for reference.    Significant Diagnostic Studies: Ct Head Wo Contrast  03/04/2014   CLINICAL DATA:  53 year old female with altered mental status. Initial encounter.  EXAM: CT HEAD WITHOUT CONTRAST  TECHNIQUE: Contiguous axial images were obtained from the base of the skull through the vertex without intravenous contrast.  COMPARISON:  None.  FINDINGS: No intracranial abnormalities are identified, including mass lesion or mass effect, hydrocephalus, extra-axial fluid collection, midline shift, hemorrhage, or acute infarction.  The visualized bony calvarium is unremarkable.  Nasal intubation noted.  IMPRESSION: No intracranial abnormalities.   Electronically Signed   By: Hassan Rowan M.D.   On: 03/04/2014 00:08   Dg Chest Portable 1 View  03/04/2014   CLINICAL DATA:  Altered mental status.  Initial encounter.  EXAM: PORTABLE CHEST - 1 VIEW  COMPARISON:  None.  FINDINGS: The cardiomediastinal silhouette is unremarkable.  This is a low volume film with bibasilar atelectasis.  There is no evidence of focal airspace disease, pulmonary edema, suspicious pulmonary nodule/mass, pleural effusion, or pneumothorax. No acute bony abnormalities are identified.  IMPRESSION: Low volume film with  bibasilar atelectasis.   Electronically Signed   By: Hassan Rowan M.D.   On: 03/04/2014 00:28    Microbiology: Recent Results (from the past 240 hour(s))  MRSA PCR Screening     Status: None   Collection Time: 03/04/14  6:15 AM  Result Value Ref Range Status   MRSA by PCR NEGATIVE NEGATIVE Final    Comment:        The GeneXpert MRSA Assay (FDA approved for NASAL specimens only), is one component of a comprehensive MRSA colonization surveillance program. It is not intended to diagnose MRSA infection nor to guide or monitor treatment for MRSA infections.      Labs: Basic Metabolic Panel:  Recent Labs Lab 03/03/14 1137 03/03/14 2248 03/04/14 0817  NA 136 141 145  K 4.1 4.2 4.5  CL 101 103 108  CO2 26 24 21   GLUCOSE 100* 152* 115*  BUN 8 5* 6  CREATININE 0.67 0.74 0.58  CALCIUM 8.9 8.6 8.2*   Liver Function Tests:  Recent Labs Lab 03/03/14 1137 03/03/14 2248 03/04/14 0817  AST 22 22 21   ALT 17 19 18   ALKPHOS 88 94 93  BILITOT 0.5 <0.2* <0.2*  PROT 6.9 7.2 6.9  ALBUMIN 4.1 3.5 3.3*   No results for input(s): LIPASE, AMYLASE in the last 168 hours.  Recent Labs Lab 03/04/14 0817  AMMONIA 58   CBC:  Recent Labs Lab 03/03/14 2248 03/04/14 0817  WBC 6.5 7.4  NEUTROABS 3.8 5.0  HGB 11.7* 11.3*  HCT 36.3 35.7*  MCV 78.1 80.8  PLT 273 284   Cardiac Enzymes:  Recent Labs Lab 03/03/14 1104 03/04/14 0817  CKTOTAL 60  --   TROPONINI  --  <0.30   BNP: BNP (last 3 results) No results for input(s): PROBNP in the last 8760 hours. CBG:  Recent Labs Lab 03/04/14 2018 03/04/14 2355 03/05/14 0439 03/05/14 0752 03/05/14 1131  GLUCAP 249* 99 126* 120* 121*       Signed:  Cristal Ford  [facility] 03/05/2014, 3:14 PM

## 2014-03-05 NOTE — Consult Note (Signed)
[facility] Telepsychiatry Consult  Luretha Eberly is an 53 y.o. female. Total Time spent with patient: 45 minutes  Assessment:  AXIS I:  Alcohol Abuse, Generalized Anxiety Disorder, Major Depression, Recurrent severe and Substance Induced Mood Disorder AXIS II:  Cluster B Traits AXIS III:   Past Medical History  Diagnosis Date  . Hypertension   . Depression   . Diabetes mellitus   . Insomnia   . Glaucoma   . Asthma     as a child  . Pneumothorax     secondary to pneumonia   AXIS IV:  other psychosocial or environmental problems, problems related to social environment and problems with primary support group AXIS V:  61-70 mild symptoms  Plan: SEE BELOW  Subjective:   Amber Mata is a 53 y.o. female patient admitted with depression with suicidal attempt.Pt reports that she was "extremely intoxicated" before she came into the hospital and that she was changing from [place] to Ambien. Reports indicate that 4 Soma were missing, which was her reported home dosage daily and that 2 Ambien 62m tabs were missing making 141mwhich was her new reported home dose. Pt affirms understanding from this point forward never to mix any sleeping medications with alcohol. Pt states that she intended to only take the Ambien, but had 2 "very large martinis" before she went home to take her pills and that she probably thought she had not taken any pills yet. Pt is alert/oriented/calm/cooperative and denies SI, HI, and AVH. Pt would like outpatient referrals.    HPI:  CaJyllian Haynies a [age].o. female seen, chart reviewed and case discussed with the TiOtila Kluverbefore meals from the behavioral health Hospital and staff RN and Dr. GhSloan LeiterPatient is poorly cooperative during this evaluation. Patient has been suffering with the medical problems and has been seeing outpatient family therapist for the last 5 years. Patient has been suffering with chronic pain syndrome, probably fibromyalgia, receiving multiple medication from different  doctors without significant relief. Patient reportedly overdosed her medications Ambien, Soma unknown dose and also drank alcohol. Patient blood alcohol level is 261 on admission. Patient presented to the hospital with the unresponsively. Patient does not trust her physicians and stated she is feeling safe and trying to walk away from the hospital without both medical and psychiatric clearance. Patient minimizes her symptoms of depression, anxiety and suicidal ideation and has a poor insight into her current state of mind. Hospital security and [employer] has to be called to keep her safe in the hospital. Patient is dangerous to herself and others as she has poor insight, impulsive and in judgment. Patient husband is concerned about her safety as she has been overdosing on her medication. Patient was seen by Dr. BaLoretha Stapleras outpatient who is also concerned about patient's safety and current mental status.  Medical history: Patient with history of diabetes mellitus and hyperlipidemia and depression and chronic pain was brought to the ER the patient has been found that patient was unresponsive after he came back from work last evening around 4 PM. In the ER patient was found to have a GCS of 7 and initially ER physician was planning to intubate but by then patient became digital responsive. CT head did not show any acute. As per the history provided by patient's husband to the ER physician patient may have taken extra dose of Soma around 4 more tablets than usual and Ambien couple more with alcohol. Patient alcohol level was found to be elevated. Tylenol levels  and urine drug screen and salicylates were negative. Patient at this time is still quite encephalopathic and minimally responsive to painful stimuli. Pupils are reacting to light. Patient has been admitted for further management. Patient did not respond much to Narcan.   Review of Systems: As presented in the history of presenting illness, rest negative.  HPI  Elements:   Location:  Depression, anxiety and alcohol intoxication. Quality:  Poor insight, judgment and impulse control. Severity:  Alcohol intoxication and unknown over dose of prescription medication. Timing:  Chronic depression and fibromyalgia. Duration:  Few months. Context:   intentional overdose and there alcohol intoxication.  Past Psychiatric History: Past Medical History  Diagnosis Date  . Hypertension   . Depression   . Diabetes mellitus   . Insomnia   . Glaucoma   . Asthma     as a child  . Pneumothorax     secondary to pneumonia    reports that she has been smoking Cigarettes.  She has been smoking about 0.00 packs per day. She has never used smokeless tobacco. She reports that she drinks about 5.0 oz of alcohol per week. She reports that she does not use illicit drugs. Family History  Problem Relation Age of Onset  . Arthritis    . Diabetes Father   . Diabetes Paternal Grandfather   . Hyperlipidemia    . Hypertension    . Stroke    . Heart disease    . Heart attack Paternal Grandmother      Living Arrangements: Spouse/significant other   Abuse/Neglect [facility]) Physical Abuse: Denies Verbal Abuse: Denies Sexual Abuse: Denies Allergies:   Allergies  Allergen Reactions  . Milk-Related Compounds     Lactose Intolerant  . Morphine And Related   . Oxycodone     Itch    ACT Assessment Complete:  No Objective: Blood pressure 172/86, pulse 80, temperature 98 F (36.7 C), temperature source Oral, resp. rate 18, height '5\' 5"'  (1.651 m), weight 99.338 kg (219 lb), last menstrual period 09/28/2012, SpO2 98 %.Body mass index is 36.44 kg/(m^2). Results for orders placed or performed during the hospital encounter of 03/03/14 (from the past 72 hour(s))  Comprehensive metabolic panel     Status: Abnormal   Collection Time: 03/03/14 10:48 PM  Result Value Ref Range   Sodium 141 137 - 147 mEq/L   Potassium 4.2 3.7 - 5.3 mEq/L   Chloride 103 96 - 112 mEq/L   CO2 24  19 - 32 mEq/L   Glucose, Bld 152 (H) 70 - 99 mg/dL   BUN 5 (L) 6 - 23 mg/dL   Creatinine, Ser 0.74 0.50 - 1.10 mg/dL   Calcium 8.6 8.4 - 10.5 mg/dL   Total Protein 7.2 6.0 - 8.3 g/dL   Albumin 3.5 3.5 - 5.2 g/dL   AST 22 0 - 37 U/L   ALT 19 0 - 35 U/L   Alkaline Phosphatase 94 39 - 117 U/L   Total Bilirubin <0.2 (L) 0.3 - 1.2 mg/dL   GFR calc non Af Amer >90 >90 mL/min   GFR calc Af Amer >90 >90 mL/min    Comment: (NOTE) The eGFR has been calculated using the CKD EPI equation. This calculation has not been validated in all clinical situations. eGFR's persistently <90 mL/min signify possible Chronic Kidney Disease.    Anion gap 14 5 - 15  CBC with Differential     Status: Abnormal   Collection Time: 03/03/14 10:48 PM  Result Value Ref Range  WBC 6.5 4.0 - 10.5 K/uL   RBC 4.65 3.87 - 5.11 MIL/uL   Hemoglobin 11.7 (L) 12.0 - 15.0 g/dL   HCT 36.3 36.0 - 46.0 %   MCV 78.1 78.0 - 100.0 fL   MCH 25.2 (L) 26.0 - 34.0 pg   MCHC 32.2 30.0 - 36.0 g/dL   RDW 19.1 (H) 11.5 - 15.5 %   Platelets 273 150 - 400 K/uL   Neutrophils Relative % 58 43 - 77 %   Neutro Abs 3.8 1.7 - 7.7 K/uL   Lymphocytes Relative 30 12 - 46 %   Lymphs Abs 2.0 0.7 - 4.0 K/uL   Monocytes Relative 6 3 - 12 %   Monocytes Absolute 0.4 0.1 - 1.0 K/uL   Eosinophils Relative 5 0 - 5 %   Eosinophils Absolute 0.3 0.0 - 0.7 K/uL   Basophils Relative 1 0 - 1 %   Basophils Absolute 0.1 0.0 - 0.1 K/uL  Acetaminophen level     Status: None   Collection Time: 03/03/14 10:48 PM  Result Value Ref Range   Acetaminophen (Tylenol), Serum <15.0 10 - 30 ug/mL    Comment:        THERAPEUTIC CONCENTRATIONS VARY SIGNIFICANTLY. A RANGE OF 10-30 ug/mL MAY BE AN EFFECTIVE CONCENTRATION FOR MANY PATIENTS. HOWEVER, SOME ARE BEST TREATED AT CONCENTRATIONS OUTSIDE THIS RANGE. ACETAMINOPHEN CONCENTRATIONS >150 ug/mL AT 4 HOURS AFTER INGESTION AND >50 ug/mL AT 12 HOURS AFTER INGESTION ARE OFTEN ASSOCIATED WITH TOXIC REACTIONS.    Ethanol     Status: Abnormal   Collection Time: 03/03/14 10:48 PM  Result Value Ref Range   Alcohol, Ethyl (B) 261 (H) 0 - 11 mg/dL    Comment:        LOWEST DETECTABLE LIMIT FOR SERUM ALCOHOL IS 11 mg/dL FOR MEDICAL PURPOSES ONLY   Salicylate level     Status: Abnormal   Collection Time: 03/03/14 10:48 PM  Result Value Ref Range   Salicylate Lvl <9.8 (L) 2.8 - 20.0 mg/dL  I-Stat Beta hCG blood, ED ([facility], [facility], [facility] only)     Status: None   Collection Time: 03/03/14 10:54 PM  Result Value Ref Range   I-stat hCG, quantitative <5.0 <5 mIU/mL   Comment 3            Comment:   GEST. AGE      CONC.  (mIU/mL)   <=1 WEEK        5 - 50     2 WEEKS       50 - 500     3 WEEKS       100 - 10,000     4 WEEKS     1,000 - 30,000        FEMALE AND NON-PREGNANT FEMALE:     LESS THAN 5 mIU/mL   I-Stat arterial blood gas, ED     Status: Abnormal   Collection Time: 03/04/14  2:15 AM  Result Value Ref Range   pH, Arterial 7.305 (L) 7.350 - 7.450   pCO2 arterial 47.5 (H) 35.0 - 45.0 mmHg   pO2, Arterial 73.0 (L) 80.0 - 100.0 mmHg   Bicarbonate 23.6 20.0 - 24.0 mEq/L   TCO2 25 0 - 100 mmol/L   O2 Saturation 93.0 %   Acid-base deficit 3.0 (H) 0.0 - 2.0 mmol/L   Patient temperature 98.6 F    Collection site RADIAL, ALLEN'S TEST ACCEPTABLE    Drawn by Operator    Sample type ARTERIAL  Urine rapid drug screen (hosp performed)     Status: None   Collection Time: 03/04/14  2:23 AM  Result Value Ref Range   Opiates NONE DETECTED NONE DETECTED   Cocaine NONE DETECTED NONE DETECTED   Benzodiazepines NONE DETECTED NONE DETECTED   Amphetamines NONE DETECTED NONE DETECTED   Tetrahydrocannabinol NONE DETECTED NONE DETECTED   Barbiturates NONE DETECTED NONE DETECTED    Comment:        DRUG SCREEN FOR MEDICAL PURPOSES ONLY.  IF CONFIRMATION IS NEEDED FOR ANY PURPOSE, NOTIFY LAB WITHIN 5 DAYS.        LOWEST DETECTABLE LIMITS FOR URINE DRUG SCREEN Drug Class       Cutoff (ng/mL) Amphetamine       1000 Barbiturate      200 Benzodiazepine   299 Tricyclics       242 Opiates          300 Cocaine          300 THC              50   Urinalysis, Routine w reflex microscopic     Status: None   Collection Time: 03/04/14  2:23 AM  Result Value Ref Range   Color, Urine YELLOW YELLOW   APPearance CLEAR CLEAR   Specific Gravity, Urine 1.006 1.005 - 1.030   pH 5.5 5.0 - 8.0   Glucose, UA NEGATIVE NEGATIVE mg/dL   Hgb urine dipstick NEGATIVE NEGATIVE   Bilirubin Urine NEGATIVE NEGATIVE   Ketones, ur NEGATIVE NEGATIVE mg/dL   Protein, ur NEGATIVE NEGATIVE mg/dL   Urobilinogen, UA 0.2 0.0 - 1.0 mg/dL   Nitrite NEGATIVE NEGATIVE   Leukocytes, UA NEGATIVE NEGATIVE    Comment: MICROSCOPIC NOT DONE ON URINES WITH NEGATIVE PROTEIN, BLOOD, LEUKOCYTES, NITRITE, OR GLUCOSE <1000 mg/dL.  MRSA PCR Screening     Status: None   Collection Time: 03/04/14  6:15 AM  Result Value Ref Range   MRSA by PCR NEGATIVE NEGATIVE    Comment:        The GeneXpert MRSA Assay (FDA approved for NASAL specimens only), is one component of a comprehensive MRSA colonization surveillance program. It is not intended to diagnose MRSA infection nor to guide or monitor treatment for MRSA infections.   Glucose, capillary     Status: Abnormal   Collection Time: 03/04/14  6:31 AM  Result Value Ref Range   Glucose-Capillary 108 (H) 70 - 99 mg/dL  Glucose, capillary     Status: Abnormal   Collection Time: 03/04/14  7:42 AM  Result Value Ref Range   Glucose-Capillary 201 (H) 70 - 99 mg/dL   Comment 1 Repeat Test   Troponin I     Status: None   Collection Time: 03/04/14  8:17 AM  Result Value Ref Range   Troponin I <0.30 <0.30 ng/mL    Comment:        Due to the release kinetics of cTnI, a negative result within the first hours of the onset of symptoms does not rule out myocardial infarction with certainty. If myocardial infarction is still suspected, repeat the test at appropriate intervals.   Ammonia      Status: None   Collection Time: 03/04/14  8:17 AM  Result Value Ref Range   Ammonia 58 11 - 60 umol/L  TSH     Status: None   Collection Time: 03/04/14  8:17 AM  Result Value Ref Range   TSH 1.070 0.350 - 4.500 uIU/mL  Comprehensive  metabolic panel     Status: Abnormal   Collection Time: 03/04/14  8:17 AM  Result Value Ref Range   Sodium 145 137 - 147 mEq/L   Potassium 4.5 3.7 - 5.3 mEq/L   Chloride 108 96 - 112 mEq/L   CO2 21 19 - 32 mEq/L   Glucose, Bld 115 (H) 70 - 99 mg/dL   BUN 6 6 - 23 mg/dL   Creatinine, Ser 0.58 0.50 - 1.10 mg/dL   Calcium 8.2 (L) 8.4 - 10.5 mg/dL   Total Protein 6.9 6.0 - 8.3 g/dL   Albumin 3.3 (L) 3.5 - 5.2 g/dL   AST 21 0 - 37 U/L   ALT 18 0 - 35 U/L   Alkaline Phosphatase 93 39 - 117 U/L   Total Bilirubin <0.2 (L) 0.3 - 1.2 mg/dL   GFR calc non Af Amer >90 >90 mL/min   GFR calc Af Amer >90 >90 mL/min    Comment: (NOTE) The eGFR has been calculated using the CKD EPI equation. This calculation has not been validated in all clinical situations. eGFR's persistently <90 mL/min signify possible Chronic Kidney Disease.    Anion gap 16 (H) 5 - 15  CBC with Differential     Status: Abnormal   Collection Time: 03/04/14  8:17 AM  Result Value Ref Range   WBC 7.4 4.0 - 10.5 K/uL   RBC 4.42 3.87 - 5.11 MIL/uL   Hemoglobin 11.3 (L) 12.0 - 15.0 g/dL   HCT 35.7 (L) 36.0 - 46.0 %   MCV 80.8 78.0 - 100.0 fL   MCH 25.6 (L) 26.0 - 34.0 pg   MCHC 31.7 30.0 - 36.0 g/dL   RDW 19.2 (H) 11.5 - 15.5 %   Platelets 284 150 - 400 K/uL   Neutrophils Relative % 68 43 - 77 %   Neutro Abs 5.0 1.7 - 7.7 K/uL   Lymphocytes Relative 24 12 - 46 %   Lymphs Abs 1.8 0.7 - 4.0 K/uL   Monocytes Relative 7 3 - 12 %   Monocytes Absolute 0.5 0.1 - 1.0 K/uL   Eosinophils Relative 1 0 - 5 %   Eosinophils Absolute 0.0 0.0 - 0.7 K/uL   Basophils Relative 0 0 - 1 %   Basophils Absolute 0.0 0.0 - 0.1 K/uL  Glucose, capillary     Status: Abnormal   Collection Time: 03/04/14 12:18 PM   Result Value Ref Range   Glucose-Capillary 141 (H) 70 - 99 mg/dL  Glucose, capillary     Status: Abnormal   Collection Time: 03/04/14  4:00 PM  Result Value Ref Range   Glucose-Capillary 100 (H) 70 - 99 mg/dL  Glucose, capillary     Status: Abnormal   Collection Time: 03/04/14  8:18 PM  Result Value Ref Range   Glucose-Capillary 249 (H) 70 - 99 mg/dL  Glucose, capillary     Status: None   Collection Time: 03/04/14 11:55 PM  Result Value Ref Range   Glucose-Capillary 99 70 - 99 mg/dL  Glucose, capillary     Status: Abnormal   Collection Time: 03/05/14  4:39 AM  Result Value Ref Range   Glucose-Capillary 126 (H) 70 - 99 mg/dL   Comment 1 Notify RN   Glucose, capillary     Status: Abnormal   Collection Time: 03/05/14  7:52 AM  Result Value Ref Range   Glucose-Capillary 120 (H) 70 - 99 mg/dL  Glucose, capillary     Status: Abnormal   Collection Time: 03/05/14  11:31 AM  Result Value Ref Range   Glucose-Capillary 121 (H) 70 - 99 mg/dL   Labs are reviewed and are pertinent for blood alcohol level is 261.  Current Facility-Administered Medications  Medication Dose Route Frequency Provider Last Rate Last Dose  . acetaminophen (TYLENOL) tablet 650 mg  650 mg Oral Q6H PRN Rise Patience, MD   650 mg at 03/04/14 7672   Or  . acetaminophen (TYLENOL) suppository 650 mg  650 mg Rectal Q6H PRN Rise Patience, MD      . clonazePAM Bobbye [place]) tablet 1 mg  1 mg Oral BID Durward Parcel, MD   1 mg at 03/05/14 1120  . DULoxetine (CYMBALTA) DR capsule 60 mg  60 mg Oral Daily Durward Parcel, MD   60 mg at 03/05/14 1122  . enoxaparin (LOVENOX) injection 40 mg  40 mg Subcutaneous Q24H Rise Patience, MD   40 mg at 03/05/14 1123  . estradiol (CLIMARA - Dosed in mg/24 hr) patch 0.1 mg  0.1 mg Transdermal Weekly Maryann Mikhail, DO      . folic acid (FOLVITE) tablet 1 mg  1 mg Oral Daily Rise Patience, MD   1 mg at 03/05/14 1120  . hydrALAZINE (APRESOLINE)  injection 10 mg  10 mg Intravenous Q4H PRN Rise Patience, MD      . hydrochlorothiazide (MICROZIDE) capsule 12.5 mg  12.5 mg Oral Daily Jonetta Osgood, MD   12.5 mg at 03/05/14 1121  . insulin aspart (novoLOG) injection 0-9 Units  0-9 Units Subcutaneous Q4H Jonetta Osgood, MD   0 Units at 03/04/14 1600  . irbesartan (AVAPRO) tablet 150 mg  150 mg Oral Daily Jonetta Osgood, MD   150 mg at 03/05/14 1122  . LORazepam (ATIVAN) tablet 1 mg  1 mg Oral Q6H PRN Rise Patience, MD   1 mg at 03/05/14 0029   Or  . LORazepam (ATIVAN) injection 1 mg  1 mg Intravenous Q6H PRN Rise Patience, MD      . meloxicam [facility]) tablet 15 mg  15 mg Oral Daily Maryann Mikhail, DO   15 mg at 03/05/14 1123  . [START ON 03/06/2014] metFORMIN (GLUCOPHAGE) tablet 500 mg  500 mg Oral Q breakfast Maryann Mikhail, DO      . multivitamin with minerals tablet 1 tablet  1 tablet Oral Daily Rise Patience, MD   1 tablet at 03/05/14 1121  . ondansetron (ZOFRAN) tablet 4 mg  4 mg Oral Q6H PRN Rise Patience, MD       Or  . ondansetron [facility]) injection 4 mg  4 mg Intravenous Q6H PRN Rise Patience, MD      . progesterone (PROMETRIUM) capsule 200 mg  200 mg Oral QHS Maryann Mikhail, DO      . thiamine (VITAMIN B-1) tablet 100 mg  100 mg Oral Daily Rise Patience, MD   100 mg at 03/05/14 1121   Or  . thiamine (B-1) injection 100 mg  100 mg Intravenous Daily Rise Patience, MD      . traMADol Veatrice Bourbon) tablet 50 mg  50 mg Oral Q6H PRN Jeryl Columbia, NP   50 mg at 03/04/14 2322  . traMADol (ULTRAM) tablet 50 mg  50 mg Oral Q6H PRN Cristal Ford, DO        Psychiatric Specialty Exam: Physical Exam as per history and physical   ROS depression, anxiety, restlessness and impulsive behaviors   Blood pressure  172/86, pulse 80, temperature 98 F (36.7 C), temperature source Oral, resp. rate 18, height '5\' 5"'  (1.651 m), weight 99.338 kg (219 lb), last menstrual period 09/28/2012, SpO2 98  %.Body mass index is 36.44 kg/(m^2).  General Appearance: Casual and Fairly Groomed  Engineer, water::  Good  Speech:  Clear and Coherent  Volume:  Normal  Mood:  Euthymic  Affect:  Appropriate and Congruent  Thought Process:  Coherent and Goal Directed  Orientation:  Full (Time, Place, and Person)  Thought Content:  WDL  Suicidal Thoughts:  No pt denies and reports that her actions were based on being very intoxicated; labwork supports this.   Homicidal Thoughts:  No  Memory:  Immediate;   Poor Recent;   Poor  Judgement:  Impaired  Insight:  Lacking  Psychomotor Activity:  Restlessness  Concentration:  Poor  Recall:  Poor  Fund of Knowledge:Fair  Language: Good  Akathisia:  NA  Handed:  Right  AIMS (if indicated):     Assets:  Agricultural consultant Housing Leisure Time Physical Health Resilience Social Support  Sleep:      Musculoskeletal: Strength & Muscle Tone: within normal limits Gait & Station: normal Patient leans: N/A  Treatment Plan Summary: -Discharge home with outpatient followup for anxiety/depression. -continue medical treatment if necessary  *Case reviewed with Dr. Louretta Shorten.      Benjamine Mola, FNP-BC 03/05/2014 10:58AM  Patient is known to this provider from yesterday brief consultation, case discussed and reviewed the information documented and agree with the treatment plan.  Donoven Pett,JANARDHAHA R. 03/05/2014 3:08 PM

## 2014-03-05 NOTE — Telephone Encounter (Addendum)
Patient dismissed from [facility] , effective March 04, 2014. Dismissal letter sent out by certified / registered mail. DAJ  Received signed domestic return receipt verifying delivery of certified letter on March 09, 2014. Article number [id number] [id number] DAJ

## 2014-03-05 NOTE — Progress Notes (Addendum)
Earlier in shift, patient requested to speak to MD regarding Cymbalta.  She states that she takes this mediation three times daily at home to assuage her fibromyalgia pain.  Notified Lamar Blinks NP of situation and requested that patient should speak with her primary MD.  Patient made aware.  Also complaining of uncontrolled generalized pain from fibromyalgia.  Tramadol 50 mg PO ordered and given; however, it was ineffective per patient.  Toradol 10 mg PO then ordered and administered.  Patient currently asleep.  Will continue to monitor.

## 2014-03-05 NOTE — Discharge Instructions (Signed)
Polypharmacy Problems Polypharmacy problems can occur when you take more than one medicine. Your caregivers need to know about all the medicines you take. This includes vitamins, herbs, eyedrops, over-the-counter medicines, prescription medicines, and creams. Drug interaction problems can include:  Bad reactions or side effects. This can occur when certain drugs are taken together.  Reduced benefit from your medicines. This can occur if one drug reduces the ability of another drug to work. Some drug interaction problems can be life-threatening. Your caregivers can coordinate a plan to help you avoid polypharmacy problems. RISK FACTORS Polypharmacy problems are more likely to occur if:  You have many caregivers prescribing different medicines for you. This is a common problem for elderly patients.  You have a long-term (chronic) medical condition.  You have had an organ transplant.  You have human immunodeficiency virus (HIV).  You are undergoing chemotherapy.  You have hepatitis.  You have kidney disease or kidney failure.  You have significant heart disease or lung disease.  You are elderly.  You are taking medicines that have a low margin of error. This means there is little difference between the right amount of medicine and too much medicine. Medicines in this category include:  Warfarin.  Digoxin.  Lithium.  Theophylline.  Monoamine oxidase (MAO) inhibitors.  Seizure medicines.  Cyclosporine. PREVENTION   Choose one caregiver to be in charge of coordinating your medicines. Inform this caregiver about all medicines and supplements you take, even if they are prescribed by another caregiver.  Purchase all your medicines through the same pharmacy. The pharmacy keeps track of your medicines and possible drug interactions. They can notify your caregiver of potential problems.  Read the information given to you at your pharmacy.  Carry a list of all your medicines and  their doses with you. This informs your caregivers, emergency department caregivers, and specialists about the medicines you are taking. This is especially important before you start a new medicine.  Use a system to keep track of which medicines you are taking and when. Many patients use a pillbox that separates medicines by the day and time they are supposed to be taken.  Carry a list of your chronic medical problems with you. Conditions such as kidney problems, liver problems, organ transplants, and chronic viral infections affect the way your body handles medicines.  Ask your caregiver or pharmacist if you have any questions about your medicines. SEEK MEDICAL CARE IF:  You have any problems that may be caused by your medicines.  You have chest pain.  You have shortness of breath.  You have an irregular heartbeat.  You have fainting spells.  You have shaking or tremors.  You have weakness or tiredness (lethargy).  You have a rash or swelling in any part of the body.  You have increased bleeding, rectal bleeding, vaginal bleeding, or you bruise easily.  You have abdominal pain.  You have nausea.  You have vomiting. Document Released: 04/27/2004 Document Revised: 06/12/2011 Document Reviewed: 12/07/2010 [facility] Patient Information 2015 [place], [place]. This information is not intended to replace advice given to you by your health care provider. Make sure you discuss any questions you have with your health care provider.

## 2014-03-05 NOTE — Progress Notes (Signed)
Ms. Adler refuses CPAP. She states she does not wear one at home and never has. RT will continue to monitor.

## 2014-03-05 NOTE — Progress Notes (Signed)
Transporter arrived to take patient to EEG.  Patient refused.  MD notified.  Dr. Ree Kida stated to cancel the EEG.  Jillyn Ledger, MBA, BS, RN

## 2014-03-05 NOTE — Telephone Encounter (Signed)
Spoke with [facility] to cancel all refills for patient from Dr Renold Genta, since patient is being discharged.  Per [facility] patient did not get soma, ambien or tramadol filled recently from them.  Will keep working on finding out where she filled the rx.

## 2014-03-09 ENCOUNTER — Telehealth: Payer: Self-pay | Admitting: Internal Medicine

## 2014-03-09 NOTE — Telephone Encounter (Signed)
Patient called to ask if she was still being referred to rheumatologist.  Advised her the paperwork had been sent.  Then asked patient if she got the medications filled from the other day and she said yes, when I asked her where she got them filled she said she did not remember and then hung up the phone.  Tried to recall patient but was only able to leave a message.

## 2014-03-09 NOTE — Telephone Encounter (Signed)
Patient called and states to please tell Larene Beach that she doesn't plan to "return the phone call because Dr. Renold Genta is no longer her doctor."  Advised I would relay the message.

## 2014-03-11 ENCOUNTER — Ambulatory Visit
Admission: RE | Admit: 2014-03-11 | Discharge: 2014-03-11 | Disposition: A | Discharge status: Home / Self Care | Payer: BC Managed Care – PPO | Source: Ambulatory Visit | Attending: Obstetrics & Gynecology | Admitting: Obstetrics & Gynecology

## 2014-03-11 DIAGNOSIS — E2839 Other primary ovarian failure: Secondary | ICD-10-CM

## 2014-03-11 DIAGNOSIS — Z1239 Encounter for other screening for malignant neoplasm of breast: Secondary | ICD-10-CM

## 2014-03-13 ENCOUNTER — Telehealth: Payer: Self-pay

## 2014-03-13 NOTE — Telephone Encounter (Signed)
Lmtcb//kn 

## 2014-03-13 NOTE — Telephone Encounter (Signed)
-----   Message from Lyman Speller, MD sent at 03/13/2014  6:12 AM EST ----- Please inform BMD shows normal findings in spine and some moderate osteopenia in hips.  No treatment needed.  Repeat 3-5 years.  Make sure getting 1200-1500mg  calcium with Vit D in diet/supplement and try to do 30 minutes of weight bearing exercise 3-4 times a week.

## 2014-03-16 NOTE — Telephone Encounter (Signed)
Informed pt of results. Pt voiced understanding Encounter closed

## 2014-03-19 ENCOUNTER — Other Ambulatory Visit: Payer: Self-pay

## 2014-03-19 MED ORDER — VALSARTAN-HYDROCHLOROTHIAZIDE 160-12.5 MG PO TABS: 1.0000 | ORAL_TABLET | Freq: Every day | ORAL | Status: DC

## 2014-03-20 ENCOUNTER — Telehealth: Payer: Self-pay | Admitting: Obstetrics & Gynecology

## 2014-03-20 NOTE — Telephone Encounter (Signed)
Received call from [facility] at [facility].  Patient was admitted yesterday for hyponatremia and they request to schedule follow up appointment.  Advised we are patient gyn and patient will need to follow up with pcp.  Given name of Dr. Renold Genta as patient has on record as pcp. Advised to confirm with patient first that Dr. Renold Genta is indeed her pcp prior. Caller agreed.  Routing to provider for final review. Patient agreeable to disposition. Will close encounter

## 2014-03-30 DIAGNOSIS — I10 Essential (primary) hypertension: Secondary | ICD-10-CM | POA: Insufficient documentation

## 2014-03-30 DIAGNOSIS — Z720 Tobacco use: Secondary | ICD-10-CM | POA: Insufficient documentation

## 2014-04-08 ENCOUNTER — Other Ambulatory Visit: Payer: Self-pay | Admitting: Obstetrics & Gynecology

## 2014-04-08 ENCOUNTER — Telehealth: Payer: Self-pay | Admitting: Obstetrics & Gynecology

## 2014-04-08 NOTE — Telephone Encounter (Signed)
Patient called to cancel her "recheck RX" appointment for 05/05/14. She wants Dr. Sabra Heck to know, in her words,"I am doing much better and feel better than I have in three years!" She does not want to reschedule the appointment and looks forwards to her AEX in June 2016.

## 2014-04-09 NOTE — Telephone Encounter (Signed)
Medication refill request: Temazepam 15 mg  Last AEX:  09/04/13 with Dr. Sabra Heck Next AEX: 10/01/14 with Dr. Sabra Heck Last MMG (if hormonal medication request): N/A Refill authorized: #60/? rfs, please advise.

## 2014-04-09 NOTE — Telephone Encounter (Signed)
I did not see this.  Thanks.  OK to close encounter.

## 2014-04-09 NOTE — Telephone Encounter (Signed)
Dr Sabra Heck, did you get this information? And do I need to call her back or is this ok? Please advise.//kn

## 2014-04-09 NOTE — Telephone Encounter (Signed)
RX printed, signed by Dr. Sabra Heck and faxed to [facility].

## 2014-04-23 ENCOUNTER — Other Ambulatory Visit: Payer: BC Managed Care – PPO | Admitting: Internal Medicine

## 2014-04-24 ENCOUNTER — Encounter: Payer: BC Managed Care – PPO | Admitting: Internal Medicine

## 2014-05-05 ENCOUNTER — Institutional Professional Consult (permissible substitution): Payer: Self-pay | Admitting: Obstetrics & Gynecology

## 2014-08-17 ENCOUNTER — Other Ambulatory Visit: Payer: Self-pay | Admitting: Internal Medicine

## 2014-10-01 ENCOUNTER — Ambulatory Visit: Payer: BC Managed Care – PPO | Admitting: Obstetrics & Gynecology

## 2014-11-04 ENCOUNTER — Other Ambulatory Visit: Payer: Self-pay | Admitting: Obstetrics & Gynecology

## 2014-11-04 NOTE — Telephone Encounter (Signed)
Needs annual examination done this summer.  Please schedule.  I will approve one month of her Vivelle Dot.

## 2014-11-04 NOTE — Telephone Encounter (Signed)
Medication refill request: Vivelle Dot 0.1 mg patch Last AEX:  09/04/13 with SM Next AEX: 12/30/15 with SM  Last MMG (if hormonal medication request): 03/12/14 bi-rads 1: negative  Refill authorized: #24/0 rfs  (routed to Dr. Quincy Simmonds since Dr. Sabra Heck is out of the office today).

## 2014-11-06 NOTE — Telephone Encounter (Signed)
"  Tried calling patient got a message that says "The person you are trying to reach has a voicemail box that has not been set up yet, please try again later".

## 2014-11-10 NOTE — Telephone Encounter (Addendum)
Tried to call patient got a message saying the mailbox is full and cannot accept new messages.

## 2014-11-13 NOTE — Telephone Encounter (Signed)
S/w "Ebony Hail" at [facility] they are aware that patient cannot have any additional refills until patient comes in to see [place] for appointment.

## 2014-11-13 NOTE — Telephone Encounter (Signed)
We can send a message through to the pharmacy no further refills without an appointment.

## 2014-11-13 NOTE — Telephone Encounter (Signed)
Have called patient x2 no call back please advise.

## 2014-11-23 ENCOUNTER — Other Ambulatory Visit: Payer: Self-pay | Admitting: Obstetrics & Gynecology

## 2014-11-23 ENCOUNTER — Other Ambulatory Visit: Payer: Self-pay | Admitting: Internal Medicine

## 2014-11-23 NOTE — Telephone Encounter (Signed)
Called patient and scheduled her for 12/08/14 with Dr. Sabra Heck, patient also asked if when she comes in she can have a ca125 drawn.  Routed to [place].

## 2014-11-23 NOTE — Telephone Encounter (Signed)
Yes, I can draw that when she comes.

## 2014-11-23 NOTE — Telephone Encounter (Signed)
Medication refill request: Progesterone 200 mg  Last AEX:  09/04/13 with SM  Next AEX: 12/30/15 with SM  Last MMG (if hormonal medication request): 03/11/14 bi-rads 1: negative  Refill authorized: Please advise.  I have called patient twice to schedule AEX 11/04/14.

## 2014-11-23 NOTE — Telephone Encounter (Signed)
Rx denied.  Last AEX 09/04/13.  Needs exam now.

## 2014-11-24 ENCOUNTER — Other Ambulatory Visit: Payer: Self-pay | Admitting: Internal Medicine

## 2014-11-24 ENCOUNTER — Telehealth: Payer: Self-pay | Admitting: Obstetrics & Gynecology

## 2014-11-24 NOTE — Telephone Encounter (Signed)
Medication refill request: Progesterone 200 mg  Last AEX:  09/04/13 with SM  Next AEX: 12/08/14 with SM  Last MMG (if hormonal medication request): 03/12/14 breast density category b; bi-rads 1: negative  Refill authorized: #90?

## 2014-12-08 ENCOUNTER — Ambulatory Visit: Payer: BC Managed Care – PPO | Admitting: Obstetrics & Gynecology
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
## 2015-01-05 ENCOUNTER — Other Ambulatory Visit: Payer: Self-pay | Admitting: Obstetrics and Gynecology

## 2015-01-05 MED ORDER — PROGESTERONE MICRONIZED 200 MG PO CAPS: 200.0000 mg | ORAL_CAPSULE | Freq: Every day | ORAL | Status: DC

## 2015-01-05 NOTE — Telephone Encounter (Signed)
Medication refill request: Vivelle Dot Patch Last AEX:  09/04/2013 with SM Next AEX: 12/08/2014 with SM Last MMG (if hormonal medication request):  Refill authorized: please advise  Patient was given a call and message was left regarding scheduling AEX.

## 2015-01-05 NOTE — Telephone Encounter (Signed)
  Patient has been given an appointment for her AEX on 02/15/15 with Dr. Sabra Heck.  Pt stated that is currently taking her Prometrium along with her Vivelle Dot as well. Patient medications has been sent to her local pharmacy for a month supply.

## 2015-01-05 NOTE — Telephone Encounter (Signed)
The patient should be on daily prometrium with her vivelle dot, please make sure she is taking this. Her mammogram is UTD. Please give her a refill of her prometrium and vivelle dot until she can get in for her annual exam. I don't see that she has an appointment set up.  Thanks

## 2015-01-22 ENCOUNTER — Other Ambulatory Visit: Payer: Self-pay | Admitting: Obstetrics and Gynecology

## 2015-01-22 NOTE — Telephone Encounter (Signed)
eScribe request from [facility] for refill on VIVELLE DOT Last filled - 01/05/15, #8 with 0 refills Last AEX - 09/04/13 with SM Next AEX - 02/15/15 with SM Last MMG if hormone - 03/11/14, Bi-Rads 1:  Negative  Pt will need one additional refill to last until AEX. Please advise refill.

## 2015-02-15 ENCOUNTER — Encounter: Payer: Self-pay | Admitting: Obstetrics & Gynecology

## 2015-02-15 ENCOUNTER — Other Ambulatory Visit: Payer: Self-pay | Admitting: Obstetrics & Gynecology

## 2015-02-15 ENCOUNTER — Ambulatory Visit (INDEPENDENT_AMBULATORY_CARE_PROVIDER_SITE_OTHER): Payer: BC Managed Care – PPO | Admitting: Obstetrics & Gynecology

## 2015-02-15 VITALS — BP 132/80 | HR 68 | Resp 16 | Ht 65.25 in | Wt 222.0 lb

## 2015-02-15 DIAGNOSIS — Z79890 Hormone replacement therapy (postmenopausal): Secondary | ICD-10-CM

## 2015-02-15 DIAGNOSIS — E785 Hyperlipidemia, unspecified: Secondary | ICD-10-CM | POA: Diagnosis not present

## 2015-02-15 DIAGNOSIS — G4700 Insomnia, unspecified: Secondary | ICD-10-CM | POA: Diagnosis not present

## 2015-02-15 DIAGNOSIS — Z8041 Family history of malignant neoplasm of ovary: Secondary | ICD-10-CM | POA: Diagnosis not present

## 2015-02-15 DIAGNOSIS — Z01419 Encounter for gynecological examination (general) (routine) without abnormal findings: Secondary | ICD-10-CM

## 2015-02-15 MED ORDER — NONFORMULARY OR COMPOUNDED ITEM: Status: DC

## 2015-02-15 MED ORDER — TEMAZEPAM 15 MG PO CAPS: 15.0000 mg | ORAL_CAPSULE | Freq: Every evening | ORAL | Status: DC | PRN

## 2015-02-15 MED ORDER — PROGESTERONE MICRONIZED 200 MG PO CAPS: 200.0000 mg | ORAL_CAPSULE | Freq: Every day | ORAL | Status: DC

## 2015-02-15 MED ORDER — ESTRADIOL 0.1 MG/24HR TD PTTW: 1.0000 | MEDICATED_PATCH | TRANSDERMAL | Status: DC

## 2015-02-15 NOTE — Progress Notes (Signed)
This was addressed via michelle.
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
## 2015-02-16 LAB — CA 125: CA 125: 6 U/mL (ref <35)

## 2015-02-17 LAB — IPS PAP TEST WITH REFLEX TO HPV

## 2015-02-19 LAB — TESTOSTERONE: TESTOSTERONE: 34 ng/dL (ref 10–70)

## 2015-12-30 ENCOUNTER — Ambulatory Visit: Payer: BC Managed Care – PPO | Admitting: Obstetrics & Gynecology

## 2016-01-19 DIAGNOSIS — G4733 Obstructive sleep apnea (adult) (pediatric): Secondary | ICD-10-CM | POA: Insufficient documentation

## 2016-02-28 ENCOUNTER — Other Ambulatory Visit: Payer: Self-pay | Admitting: Obstetrics & Gynecology

## 2016-02-28 NOTE — Telephone Encounter (Signed)
Medication refill request: Prometrium  Last AEX:  02-15-15  Next AEX: 06-06-16  Last MMG (if hormonal medication request): 03-12-14 WNL  Refill authorized: please advise

## 2016-06-05 NOTE — Progress Notes (Deleted)
56 y.o. G0P0000 Married Caucasian F here for annual exam.    Patient's last menstrual period was 09/28/2012.          Sexually active: {yes no:314532}  The current method of family planning is tubal ligation.    Exercising: {yes no:314532}  {types:19826} Smoker:  yes  Health Maintenance: Pap:  02/15/15 negative, 09/04/13 negative  History of abnormal Pap:  no MMG:  03/11/14 BIRADS 1 negative  Colonoscopy:  7/12- repeat 10 years  BMD:   03/12/14 osteopenia  TDaP:  03/10/10  Pneumonia vaccine(s):  04/03/06  Zostavax:   *** Hep C testing: *** Screening Labs: ***, Hb today: ***   reports that she has been smoking Cigarettes.  She has never used smokeless tobacco. She reports that she does not drink alcohol or use drugs.  Past Medical History:  Diagnosis Date  . Asthma    as a child  . Depression   . Diabetes mellitus   . Glaucoma   . Hypertension   . Insomnia   . Pneumothorax    secondary to pneumonia    Past Surgical History:  Procedure Laterality Date  . ABDOMINOPLASTY  2007  . KNEE SURGERY     left  . Mini gastric bypass  2006  . TONSILLECTOMY    . TUBAL LIGATION      Current Outpatient Prescriptions  Medication Sig Dispense Refill  . albuterol (PROVENTIL HFA;VENTOLIN HFA) 108 (90 BASE) MCG/ACT inhaler Inhale into the lungs.    . Coenzyme Q10 (CO Q 10 PO) Take 1 capsule by mouth daily.    Marland Kitchen EPINEPHrine (EPIPEN) 0.3 mg/0.3 mL SOAJ injection Inject 0.3 mLs (0.3 mg total) into the muscle once. 2 Device prn  . estradiol (VIVELLE-DOT) 0.1 MG/24HR patch Place 1 patch (0.1 mg total) onto the skin 2 (two) times a week. 24 patch 4  . folic acid (FOLVITE) 1 MG tablet Take 1 tablet (1 mg total) by mouth daily.    . metFORMIN (GLUCOPHAGE) 500 MG tablet take 1 tablet by mouth once daily 90 tablet 3  . metoprolol tartrate (LOPRESSOR) 25 MG tablet     . Multiple Vitamin (MULTIVITAMIN WITH MINERALS) TABS tablet Take 1 tablet by mouth daily.    . NONFORMULARY OR COMPOUNDED ITEM Topical  testosterone 1% cream.  Apply 1/4 tsp to inner thighs three times weekly.  Disp:  60 grams 1 each 2  . omega-3 acid ethyl esters (LOVAZA) 1 G capsule Take 1 g by mouth 3 (three) times daily.    Marland Kitchen PRESCRIPTION MEDICATION Apply 1 application topically daily as needed (Magnesium).    . progesterone (PROMETRIUM) 200 MG capsule TAKE ONE CAPSULE BY MOUTH ONCE DAILY 90 capsule 1  . temazepam (RESTORIL) 15 MG capsule Take 1 capsule (15 mg total) by mouth at bedtime as needed for sleep. 60 capsule 2  . valsartan (DIOVAN) 160 MG tablet      No current facility-administered medications for this visit.     Family History  Problem Relation Age of Onset  . Arthritis    . Diabetes Father   . Diabetes Paternal Grandfather   . Hyperlipidemia    . Hypertension    . Stroke    . Heart disease    . Heart attack Paternal Grandmother     ROS:  Pertinent items are noted in HPI.  Otherwise, a comprehensive ROS was negative.  Exam:   LMP 09/28/2012   Weight change: @WEIGHTCHANGE @ Height:      Ht Readings from Last  3 Encounters:  02/15/15 5' 5.25" (1.657 m)  03/04/14 5\' 5"  (1.651 m)  03/03/14 5' 5.25" (1.657 m)    General appearance: alert, cooperative and appears stated age Head: Normocephalic, without obvious abnormality, atraumatic Neck: no adenopathy, supple, symmetrical, trachea midline and thyroid {EXAM; THYROID:18604} Lungs: clear to auscultation bilaterally Breasts: {Exam; breast:13139::"normal appearance, no masses or tenderness"} Heart: regular rate and rhythm Abdomen: soft, non-tender; bowel sounds normal; no masses,  no organomegaly Extremities: extremities normal, atraumatic, no cyanosis or edema Skin: Skin color, texture, turgor normal. No rashes or lesions Lymph nodes: Cervical, supraclavicular, and axillary nodes normal. No abnormal inguinal nodes palpated Neurologic: Grossly normal   Pelvic: External genitalia:  no lesions              Urethra:  normal appearing urethra with no  masses, tenderness or lesions              Bartholins and Skenes: normal                 Vagina: normal appearing vagina with normal color and discharge, no lesions              Cervix: {exam; cervix:14595}              Pap taken: {yes no:314532} Bimanual Exam:  Uterus:  {exam; uterus:12215}              Adnexa: {exam; adnexa:12223}               Rectovaginal: Confirms               Anus:  normal sphincter tone, no lesions  Chaperone was present for exam.  A:  Well Woman with normal exam  P:   {plan; gyn:5269::"mammogram","pap smear","return annually or prn"}

## 2016-06-06 ENCOUNTER — Telehealth: Payer: Self-pay | Admitting: Obstetrics & Gynecology

## 2016-06-06 ENCOUNTER — Ambulatory Visit: Payer: BC Managed Care – PPO | Admitting: Obstetrics & Gynecology

## 2016-06-06 NOTE — Telephone Encounter (Signed)
Called patient to reschedule her cancelled appointment for an AEX today with Dr. Sabra Heck but could not leave a message due to phone continuously busy. Sent patient a MyChart message with cancellation and our contact information. Patient is in recall.

## 2016-10-02 ENCOUNTER — Ambulatory Visit: Payer: BC Managed Care – PPO | Admitting: Obstetrics & Gynecology

## 2016-10-02 NOTE — Progress Notes (Deleted)
56 y.o. G0P0000 MarriedCaucasianF here for annual exam.    Patient's last menstrual period was 09/28/2012.          Sexually active: {yes no:314532}  The current method of family planning is tubal ligation.    Exercising: {yes no:314532}  {types:19826} Smoker:  {YES NO:22349}  Health Maintenance: Pap:  02/15/15 Neg   09/04/13 Neg  History of abnormal Pap:  no MMG:  03/11/14 BIRADS1:neg  Colonoscopy:  09/21/10 Normal. F/u 10 years  BMD:   03/12/14 Osteopenia  TDaP:  2011 Pneumonia vaccine(s):  2008 Zostavax:   *** Hep C testing: *** Screening Labs: ***, Hb today: ***, Urine today: ***   reports that she has been smoking Cigarettes.  She has never used smokeless tobacco. She reports that she does not drink alcohol or use drugs.  Past Medical History:  Diagnosis Date  . Asthma    as a child  . Depression   . Diabetes mellitus   . Glaucoma   . Hypertension   . Insomnia   . Pneumothorax    secondary to pneumonia    Past Surgical History:  Procedure Laterality Date  . ABDOMINOPLASTY  2007  . KNEE SURGERY     left  . Mini gastric bypass  2006  . TONSILLECTOMY    . TUBAL LIGATION      Current Outpatient Prescriptions  Medication Sig Dispense Refill  . albuterol (PROVENTIL HFA;VENTOLIN HFA) 108 (90 BASE) MCG/ACT inhaler Inhale into the lungs.    . Coenzyme Q10 (CO Q 10 PO) Take 1 capsule by mouth daily.    Marland Kitchen EPINEPHrine (EPIPEN) 0.3 mg/0.3 mL SOAJ injection Inject 0.3 mLs (0.3 mg total) into the muscle once. 2 Device prn  . estradiol (VIVELLE-DOT) 0.1 MG/24HR patch Place 1 patch (0.1 mg total) onto the skin 2 (two) times a week. 24 patch 4  . folic acid (FOLVITE) 1 MG tablet Take 1 tablet (1 mg total) by mouth daily.    . metFORMIN (GLUCOPHAGE) 500 MG tablet take 1 tablet by mouth once daily 90 tablet 3  . metoprolol tartrate (LOPRESSOR) 25 MG tablet     . Multiple Vitamin (MULTIVITAMIN WITH MINERALS) TABS tablet Take 1 tablet by mouth daily.    . NONFORMULARY OR COMPOUNDED  ITEM Topical testosterone 1% cream.  Apply 1/4 tsp to inner thighs three times weekly.  Disp:  60 grams 1 each 2  . omega-3 acid ethyl esters (LOVAZA) 1 G capsule Take 1 g by mouth 3 (three) times daily.    Marland Kitchen PRESCRIPTION MEDICATION Apply 1 application topically daily as needed (Magnesium).    . progesterone (PROMETRIUM) 200 MG capsule TAKE ONE CAPSULE BY MOUTH ONCE DAILY 90 capsule 1  . temazepam (RESTORIL) 15 MG capsule Take 1 capsule (15 mg total) by mouth at bedtime as needed for sleep. 60 capsule 2  . valsartan (DIOVAN) 160 MG tablet      No current facility-administered medications for this visit.     Family History  Problem Relation Age of Onset  . Arthritis Unknown   . Diabetes Father   . Diabetes Paternal Grandfather   . Hyperlipidemia Unknown   . Hypertension Unknown   . Stroke Unknown   . Heart disease Unknown   . Heart attack Paternal Grandmother     ROS:  Pertinent items are noted in HPI.  Otherwise, a comprehensive ROS was negative.  Exam:   LMP 09/28/2012   Weight change: @WEIGHTCHANGE @ Height:      Ht Readings from  Last 3 Encounters:  02/15/15 5' 5.25" (1.657 m)  03/04/14 5\' 5"  (1.651 m)  03/03/14 5' 5.25" (1.657 m)    General appearance: alert, cooperative and appears stated age Head: Normocephalic, without obvious abnormality, atraumatic Neck: no adenopathy, supple, symmetrical, trachea midline and thyroid {EXAM; THYROID:18604} Lungs: clear to auscultation bilaterally Breasts: {Exam; breast:13139::"normal appearance, no masses or tenderness"} Heart: regular rate and rhythm Abdomen: soft, non-tender; bowel sounds normal; no masses,  no organomegaly Extremities: extremities normal, atraumatic, no cyanosis or edema Skin: Skin color, texture, turgor normal. No rashes or lesions Lymph nodes: Cervical, supraclavicular, and axillary nodes normal. No abnormal inguinal nodes palpated Neurologic: Grossly normal   Pelvic: External genitalia:  no lesions               Urethra:  normal appearing urethra with no masses, tenderness or lesions              Bartholins and Skenes: normal                 Vagina: normal appearing vagina with normal color and discharge, no lesions              Cervix: {exam; cervix:14595}              Pap taken: {yes no:314532} Bimanual Exam:  Uterus:  {exam; uterus:12215}              Adnexa: {exam; adnexa:12223}               Rectovaginal: Confirms               Anus:  normal sphincter tone, no lesions  Chaperone was present for exam.  A:  Well Woman with normal exam  P:   {plan; gyn:5269::"mammogram","pap smear","return annually or prn"}

## 2016-10-05 ENCOUNTER — Ambulatory Visit: Payer: BC Managed Care – PPO | Admitting: Obstetrics & Gynecology

## 2016-11-13 ENCOUNTER — Telehealth: Payer: Self-pay | Admitting: *Deleted

## 2016-11-13 ENCOUNTER — Encounter: Payer: Self-pay | Admitting: Obstetrics & Gynecology

## 2016-11-13 ENCOUNTER — Other Ambulatory Visit (HOSPITAL_COMMUNITY)
Admission: RE | Admit: 2016-11-13 | Discharge: 2016-11-13 | Disposition: A | Discharge status: Home / Self Care | Payer: BC Managed Care – PPO | Source: Ambulatory Visit | Attending: Obstetrics & Gynecology | Admitting: Obstetrics & Gynecology

## 2016-11-13 ENCOUNTER — Ambulatory Visit (INDEPENDENT_AMBULATORY_CARE_PROVIDER_SITE_OTHER): Payer: BC Managed Care – PPO | Admitting: Obstetrics & Gynecology

## 2016-11-13 ENCOUNTER — Other Ambulatory Visit: Payer: Self-pay | Admitting: Obstetrics & Gynecology

## 2016-11-13 VITALS — BP 110/76 | HR 74 | Resp 14 | Ht 65.75 in | Wt 226.0 lb

## 2016-11-13 DIAGNOSIS — Z01419 Encounter for gynecological examination (general) (routine) without abnormal findings: Secondary | ICD-10-CM | POA: Diagnosis not present

## 2016-11-13 DIAGNOSIS — Z1231 Encounter for screening mammogram for malignant neoplasm of breast: Secondary | ICD-10-CM

## 2016-11-13 DIAGNOSIS — Z205 Contact with and (suspected) exposure to viral hepatitis: Secondary | ICD-10-CM | POA: Diagnosis present

## 2016-11-13 DIAGNOSIS — Z124 Encounter for screening for malignant neoplasm of cervix: Secondary | ICD-10-CM | POA: Diagnosis not present

## 2016-11-13 NOTE — Progress Notes (Signed)
55 y.o. G0P0000 MarriedCaucasianF here for annual exam.  Doing well.  Seeing Eldridge Abrahams, NP.  Just had blood work done in June.  Requested to see endocrinologist.  Has appt in September.    Denies vaginal bleeding.    Going to [facility] for hormonal therapy.  Having estrogen and testosterone pellet injections.  Still on oral progesterone.    Patient's last menstrual period was 09/28/2012.          Sexually active: Yes.    The current method of family planning is tubal ligation.    Exercising: No.  The patient does not participate in regular exercise at present. Smoker:  yes  Health Maintenance: Pap:  02/15/15 negative, 09/04/13 negative History of abnormal Pap:  no MMG:  03/11/14 BIRADS 1 negative  Colonoscopy:  7/12- repeat 10 years  BMD:   12/15, -0.9, -1.7 TDaP:  2011 Pneumonia vaccine(s):  Unsure of date Zostavax:   never Hep C testing: discuss with provider  Screening Labs: PCP, Hb today: PCP   reports that she has been smoking Cigarettes.  She has never used smokeless tobacco. She reports that she does not drink alcohol or use drugs.  Past Medical History:  Diagnosis Date  . Asthma    as a child  . Depression   . Diabetes mellitus   . Glaucoma   . Hypertension   . Insomnia   . Pneumothorax    secondary to pneumonia    Past Surgical History:  Procedure Laterality Date  . ABDOMINOPLASTY  2007  . KNEE SURGERY     left  . Mini gastric bypass  2006  . TONSILLECTOMY    . TUBAL LIGATION      Current Outpatient Prescriptions  Medication Sig Dispense Refill  . aspirin EC 81 MG tablet Take 81 mg by mouth.    . Blood Glucose Monitoring Suppl (FIFTY50 GLUCOSE METER 2.0) w/Device KIT Use as instructed    . Calcium Carbonate Antacid (TUMS PO) Take by mouth.    . Coenzyme Q10 (CO Q 10 PO) Take by mouth.    . EPINEPHrine (EPIPEN) 0.3 mg/0.3 mL SOAJ injection Inject 0.3 mLs (0.3 mg total) into the muscle once. 2 Device prn  . folic acid (FOLVITE) 1 MG tablet Take 1 tablet (1  mg total) by mouth daily.    . metFORMIN (GLUCOPHAGE) 500 MG tablet take 1 tablet by mouth once daily 90 tablet 3  . metoprolol tartrate (LOPRESSOR) 25 MG tablet     . Multiple Vitamin (MULTIVITAMIN WITH MINERALS) TABS tablet Take 1 tablet by mouth daily.    Marland Kitchen olmesartan (BENICAR) 20 MG tablet Take 20 mg by mouth.    . omega-3 acid ethyl esters (LOVAZA) 1 G capsule Take 1 g by mouth 3 (three) times daily.    Marland Kitchen PRESCRIPTION MEDICATION Apply 1 application topically daily as needed (Magnesium).    . progesterone (PROMETRIUM) 200 MG capsule TAKE ONE CAPSULE BY MOUTH ONCE DAILY 90 capsule 1  . temazepam (RESTORIL) 15 MG capsule Take 1 capsule (15 mg total) by mouth at bedtime as needed for sleep. 60 capsule 2   No current facility-administered medications for this visit.     Family History  Problem Relation Age of Onset  . Arthritis Unknown   . Diabetes Father   . Diabetes Paternal Grandfather   . Hyperlipidemia Unknown   . Hypertension Unknown   . Stroke Unknown   . Heart disease Unknown   . Heart attack Paternal Grandmother  ROS:  Pertinent items are noted in HPI.  Otherwise, a comprehensive ROS was negative.  Exam:   BP 110/76 (BP Location: Right Arm, Patient Position: Sitting, Cuff Size: Large)   Pulse 74   Resp 14   Ht 5' 5.75" (1.67 m)   Wt 226 lb (102.5 kg)   LMP 09/28/2012   BMI 36.76 kg/m   Weight change: +4#  Height: 5' 5.75" (167 cm)  Ht Readings from Last 3 Encounters:  11/13/16 5' 5.75" (1.67 m)  02/15/15 5' 5.25" (1.657 m)  03/04/14 '5\' 5"'  (1.651 m)    General appearance: alert, cooperative and appears stated age Head: Normocephalic, without obvious abnormality, atraumatic Neck: no adenopathy, supple, symmetrical, trachea midline and thyroid normal to inspection and palpation Lungs: clear to auscultation bilaterally Breasts: normal appearance, no masses or tenderness Heart: regular rate and rhythm Abdomen: soft, non-tender; bowel sounds normal; no masses,   no organomegaly Extremities: extremities normal, atraumatic, no cyanosis or edema Skin: Skin color, texture, turgor normal. No rashes or lesions Lymph nodes: Cervical, supraclavicular, and axillary nodes normal. No abnormal inguinal nodes palpated Neurologic: Grossly normal   Pelvic: External genitalia:  no lesions              Urethra:  normal appearing urethra with no masses, tenderness or lesions              Bartholins and Skenes: normal                 Vagina: normal appearing vagina with normal color and discharge, no lesions              Cervix: no lesions              Pap taken: Yes.   Bimanual Exam:  Uterus:  normal size, contour, position, consistency, mobility, non-tender              Adnexa: normal adnexa and no mass, fullness, tenderness               Rectovaginal: Confirms               Anus:  normal sphincter tone, no lesions  Chaperone was present for exam.  A:  Well Woman with normal exam PMP, on HRT, receiving pellet injections at [facility] DM, on Glucophage.  Seeing endocrinologist in September. Elevated lipids Insomnia H/o microgastric bypass Smoker H/o abnormal pap 2012.  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  Neg pap with neg HR HPV 3/14.  P:   Mammogram will be scheduled for pt pap smear with HR HPV obtained today Hep C antibody obtained today No rx needed for Restoril 27m nightly prn.  Pt will cal when nees RV. return annually or prn

## 2016-11-13 NOTE — Telephone Encounter (Signed)
Call to patient. Patient notified RN scheduled 3D MMG for Wednesday 11/22/16 at 1000. Patient agreeable to date and time of appointment. [facility] and phone number of [facility] provided for patient.   Routing to provider for final review. Patient agreeable to disposition. Will close encounter.

## 2016-11-14 LAB — HEPATITIS C ANTIBODY: Hep C Virus Ab: 0.1 s/co ratio (ref 0.0–0.9)

## 2016-11-15 LAB — CYTOLOGY - PAP
Adequacy: ABSENT
DIAGNOSIS: NEGATIVE
HPV (WINDOPATH): NOT DETECTED

## 2016-11-22 ENCOUNTER — Ambulatory Visit
Admission: RE | Admit: 2016-11-22 | Discharge: 2016-11-22 | Disposition: A | Discharge status: Home / Self Care | Payer: BC Managed Care – PPO | Source: Ambulatory Visit | Attending: Obstetrics & Gynecology | Admitting: Obstetrics & Gynecology

## 2016-11-22 ENCOUNTER — Telehealth: Payer: Self-pay | Admitting: Obstetrics & Gynecology

## 2016-11-22 DIAGNOSIS — Z1231 Encounter for screening mammogram for malignant neoplasm of breast: Secondary | ICD-10-CM

## 2016-11-22 NOTE — Telephone Encounter (Signed)
Spoke with patient, advised of 11/13/16 pap results as seen below per Dr. Sabra Heck. Patient verbalizes understanding and is agreeable.  Patient is agreeable to disposition. Will close encounter.    Written by Megan Salon, MD on 11/16/2016 1:27 PM   Amber Mata,  Your Pap smear is normal and the high risk HPV testing is negative. You should be able to see the results now. Please let me know if you have any questions.   Dr. Sabra Heck

## 2016-11-22 NOTE — Telephone Encounter (Signed)
Patient calling to check if pap smear results are in.

## 2018-01-02 ENCOUNTER — Telehealth: Payer: Self-pay | Admitting: Obstetrics & Gynecology

## 2018-01-02 NOTE — Telephone Encounter (Signed)
Unable to leave patient a message to call back when ready to reschedule, canceled by automated reminder call.

## 2018-01-04 ENCOUNTER — Ambulatory Visit: Payer: BC Managed Care – PPO | Admitting: Obstetrics & Gynecology

## 2018-01-04 ENCOUNTER — Encounter

## 2018-02-25 ENCOUNTER — Encounter: Payer: Self-pay | Admitting: Neurology

## 2018-02-27 ENCOUNTER — Ambulatory Visit: Payer: 59 | Admitting: Neurology

## 2018-02-27 ENCOUNTER — Encounter: Payer: Self-pay | Admitting: Neurology

## 2018-02-27 VITALS — BP 122/79 | HR 93 | Ht 66.0 in | Wt 207.0 lb

## 2018-02-27 DIAGNOSIS — F513 Sleepwalking [somnambulism]: Secondary | ICD-10-CM | POA: Diagnosis not present

## 2018-02-27 DIAGNOSIS — G4709 Other insomnia: Secondary | ICD-10-CM | POA: Diagnosis not present

## 2018-02-27 DIAGNOSIS — K769 Liver disease, unspecified: Secondary | ICD-10-CM

## 2018-02-27 DIAGNOSIS — N951 Menopausal and female climacteric states: Secondary | ICD-10-CM | POA: Diagnosis not present

## 2018-02-27 DIAGNOSIS — G4733 Obstructive sleep apnea (adult) (pediatric): Secondary | ICD-10-CM

## 2018-02-27 DIAGNOSIS — Z720 Tobacco use: Secondary | ICD-10-CM

## 2018-02-27 DIAGNOSIS — F518 Other sleep disorders not due to a substance or known physiological condition: Secondary | ICD-10-CM

## 2018-02-27 DIAGNOSIS — G4719 Other hypersomnia: Secondary | ICD-10-CM

## 2018-02-27 NOTE — Progress Notes (Signed)
SLEEP MEDICINE CLINIC   Provider:  Larey Seat, MD    Primary Care Physician:  Berkley Harvey, NP   Referring Provider: Berkley Harvey, NP    Chief Complaint  Patient presents with  . New Patient (Initial Visit)    pt alone, rm 10. pt states that she had a sleep study completed before. she had a different insurance. she was told to start CPAP and was unable to tolerate and so she took it back. pt states she only sleeps 3 hrs at time. feeling tired during the day. pt was told she snores in sleep. she also developed vertigo a year ago and still having problems with that.     HPI:  Amber Mata is a 57 y.o. female married patient, seen here on 02-27-2018, upon referral  from [place] for a sleep consultation.  I had seen Mr. Tortorelli last week.   I have the pleasure of meeting today for the first time with Amber Mata, a 57 year old Caucasian right-handed female referred by primary care Eldridge Abrahams. The patient had last seen her primary care on 06 February 2018 and was found to have an elevated HbA1c at 8.3, anemia of an unspecified type, and risk factors for obstructive sleep apnea.   She has insomnia and take sleep aids. Husband reports her to snore when on her back.  She had been diagnosed with obstructive sleep apnea in the past but could not tolerate any CPAP at the time she also could not afford CPAP.  Anemia in the past has been treated with vitamin B12 injections.  She does have some left shoulder pain which might may affect the position she can sleep in best.  She has had 3 knee surgeries and is forced to sleep supine ,  she has occasional vertigo- often in autumn and spring.  The patient is status post gastric bypass surgery in the year over a decade ago, she is hypertension and she used to be a smoker half pack per day.  She quit smoking for 18 years and then relapsed.  Chief complaint according to patient: " I am not staying asleep and I am so very tired" . She works full time form  home, drinks caffeine at home to stay awake and is exhausted.  Sleep habits are as follows: breakfast at 5 AM, lunch is 3.30 PM and dinner time is variable- sometimes none. Bedtime is 8.30-9 Pm and she has not trouble falling asleep but can't stay asleep. She is up every 2-3 hours for reasons of external factors, her husband is restless, the dog sleeps in the same room. She dreams a lot, wakes up from her dreams. She is a sleep walker since childhood.  There is discomfort, too. She is not aware of her own snoring, her apneas.  She is up at 4.30 and has an early start- no commute.    Sleep medical history and family sleep history: OSA evaluation over 12-15 years ago ended up with a diagnosis of OSA , but her insurance did not make CPAP affordable.   Social history: remarried 10 years ago, adult children, octogenarian mother, lives with spouse and dog, relapsed smoker, 1/2 ppd. Works from home for [employer].  ETOH - 4 a week, after dinner when she had dinner that day.   High caffeine use, 3-6 a day.   Review of Systems: Out of a complete 14 system review, the patient complains of only the following symptoms, and all other reviewed systems  are negative. Snoring, sleep walking.  Caffeine over use.   Epworth Sleepiness Score: 19/ 24 points , Fatigue severity score 46/ 63 points    , depression score 4/ 15 points.    Social History   Socioeconomic History  . Marital status: Married    Spouse name: Not on file  . Number of children: Not on file  . Years of education: Not on file  . Highest education level: Not on file  Occupational History  . Not on file  Social Needs  . Financial resource strain: Not on file  . Food insecurity:    Worry: Not on file    Inability: Not on file  . Transportation needs:    Medical: Not on file    Non-medical: Not on file  Tobacco Use  . Smoking status: Current Every Day Smoker    Packs/day: 0.50    Types: Cigarettes  . Smokeless tobacco: Never Used    Substance and Sexual Activity  . Alcohol use: Yes    Alcohol/week: 5.0 standard drinks    Types: 5 Glasses of wine per week  . Drug use: No  . Sexual activity: Yes    Partners: Male    Birth control/protection: Surgical    Comment: BTL  Lifestyle  . Physical activity:    Days per week: Not on file    Minutes per session: Not on file  . Stress: Not on file  Relationships  . Social connections:    Talks on phone: Not on file    Gets together: Not on file    Attends religious service: Not on file    Active member of club or organization: Not on file    Attends meetings of clubs or organizations: Not on file    Relationship status: Not on file  . Intimate partner violence:    Fear of current or ex partner: Not on file    Emotionally abused: Not on file    Physically abused: Not on file    Forced sexual activity: Not on file  Other Topics Concern  . Not on file  Social History Narrative  . Not on file    Family History  Problem Relation Age of Onset  . Arthritis Unknown   . Diabetes Father   . Heart attack Father   . Diabetes Paternal Grandfather   . Hyperlipidemia Unknown   . Hypertension Unknown   . Stroke Unknown   . Heart disease Unknown   . Heart attack Paternal Grandmother   . Stroke Mother   . Hypertension Mother   . Melanoma Mother     Past Medical History:  Diagnosis Date  . Asthma    as a child  . Depression   . Diabetes mellitus   . Glaucoma   . Hypertension   . Insomnia   . Neuropathy    fibromyalgia  . Pneumothorax    secondary to pneumonia  . Sleep apnea     Past Surgical History:  Procedure Laterality Date  . ABDOMINOPLASTY  2007  . KNEE SURGERY     left  . Mini gastric bypass  2006  . TONSILLECTOMY    . TUBAL LIGATION      Current Outpatient Medications  Medication Sig Dispense Refill  . Blood Glucose Monitoring Suppl (FIFTY50 GLUCOSE METER 2.0) w/Device KIT Use as instructed    . Calcium Carbonate Antacid (TUMS PO) Take by  mouth.    . Coenzyme Q10 (CO Q 10 PO) Take by mouth.    Marland Kitchen  EPINEPHrine (EPIPEN) 0.3 mg/0.3 mL SOAJ injection Inject 0.3 mLs (0.3 mg total) into the muscle once. 2 Device prn  . folic acid (FOLVITE) 1 MG tablet Take 1 tablet (1 mg total) by mouth daily.    . metFORMIN (GLUCOPHAGE) 500 MG tablet take 1 tablet by mouth once daily 90 tablet 3  . metoprolol tartrate (LOPRESSOR) 25 MG tablet     . Multiple Vitamin (MULTIVITAMIN WITH MINERALS) TABS tablet Take 1 tablet by mouth daily.    Marland Kitchen omega-3 acid ethyl esters (LOVAZA) 1 G capsule Take 1 g by mouth 3 (three) times daily.    Marland Kitchen PRESCRIPTION MEDICATION Apply 1 application topically daily as needed (Magnesium).    . progesterone (PROMETRIUM) 200 MG capsule TAKE ONE CAPSULE BY MOUTH ONCE DAILY 90 capsule 1  . temazepam (RESTORIL) 15 MG capsule Take 1 capsule (15 mg total) by mouth at bedtime as needed for sleep. 60 capsule 2  . olmesartan (BENICAR) 20 MG tablet Take 20 mg by mouth.     No current facility-administered medications for this visit.     Allergies as of 02/27/2018 - Review Complete 02/27/2018  Allergen Reaction Noted  . Hctz [hydrochlorothiazide]  02/15/2015  . Milk-related compounds  03/04/2014  . Morphine and related  06/28/2012  . Oxycodone  12/23/2013  . Prednisone  02/15/2015  . Zolpidem  02/15/2015  . Amlodipine Itching 02/15/2015    Vitals: BP 122/79   Pulse 93   Ht _0  (1.676 m)   Wt 207 lb (93.9 kg)   LMP 09/28/2012   BMI 33.41 kg/m  Last Weight:  Wt Readings from Last 1 Encounters:  02/27/18 207 lb (93.9 kg)   FOY:DXAJ mass index is 33.41 kg/m.     Last Height:   Ht Readings from Last 1 Encounters:  02/27/18 _1  (1.676 m)    Physical exam:  General: The patient is awake, alert and appears not in acute distress. The patient is well groomed. Head: Normocephalic, atraumatic. Neck is supple. Mallampati- 4  neck circumference:16" . Nasal airflow restricted ,   Retrognathia is mildly seen. Crowded lower  jaw-  All biological teeth  Cardiovascular:  Regular rate and rhythm , without  murmurs or carotid bruit, and without distended neck veins. Respiratory: Lungs are clear to auscultation. Skin:  Without evidence of edema, or rash Trunk: BMI is 33.4 . The patient's posture is erect.   Neurologic exam : The patient is awake and alert, oriented to place and time.   Attention span & concentration ability appears normal.  Speech is fluent,  without dysarthria, dysphonia or aphasia.  Mood and affect are appropriate.  Cranial nerves: no problems with taste or smell.  Pupils are equal and briskly reactive to light. Funduscopic exam without evidence of pallor or edema. Extraocular movements  in vertical and horizontal planes intact and without nystagmus. Visual fields by finger perimetry are intact. Hearing to finger rub intact.  Facial sensation intact to fine touch. Facial motor strength is symmetric and tongue and uvula move midline. Shoulder shrug was asymmetrical. Left shoulder higher , tension , limited ROM.  Motor exam:  Normal tone, muscle bulk and symmetric strength in all extremities. Sensory:  Fine touch, pinprick and vibration were tested in all extremities. Proprioception tested in the upper extremities was normal. Coordination: Rapid alternating movements in the fingers/hands was normal. Finger-to-nose maneuver  normal without evidence of ataxia, dysmetria or tremor. Gait and station: Patient walks without assistive device and is able unassisted to climb  up to the exam table. Strength within normal limits. Deep tendon reflexes: in the  upper and lower extremities are symmetric and intact. Babinski maneuver response is downgoing.  Assessment:  After physical and neurologic examination, review of laboratory studies,  Personal review of imaging studies, reports of other /same  Imaging studies, results of polysomnography and / or neurophysiology testing and pre-existing records as far as provided  in visit., my assessment is   1)  Excessive daytime sleepiness. Reports sleepiness is so severe she is often barely concentrating and functioning.  Uses caffeine, high amounts.   2) sleep deprived - total sleep time is between 5 and 6 hours. High fatigue may also correlate to HTN and poorly controlled DM type 2.    3) nasal congestion , mouth breather, loud snorer. OSA likely given neck size and BMI.  She also is likely to have additional hypoxemia risk due to smoking and anemia.   4) sleep walking _ vivid dreams. Parasomnia or REM BD montage    The patient was advised of the nature of the diagnosed disorder , the treatment options and the  risks for general health and wellness arising from not treating the condition.   I spent more than 45 minutes of face to face time with the patient.  Greater than 50% of time was spent in counseling and coordination of care. We have discussed the diagnosis and differential and I answered the patient's questions.    Plan:  Treatment plan and additional workup :  She would prefer an in lab sleep test as she feels her home environment is not conducive to sleep. She is insured through [facility], and we will see if they allow a non HST.    Larey Seat, MD 16/60/6301,   6:01 PM  Certified in Neurology by ABPN Certified in Sleep Medicine by [facility] [address], [place], [place] [address]     SLEEP MEDICINE CLINIC   Provider:  Larey Seat, [place] D  Primary Care Physician:  Berkley Harvey, NP   Referring Provider: Berkley Harvey, NP    Chief Complaint  Patient presents with  . New Patient (Initial Visit)    pt alone, rm 10. pt states that she had a sleep study completed before. she had a different insurance. she was told to start CPAP and was unable to tolerate and so she took it back. pt states she only sleeps 3 hrs at time. feeling tired during the day. pt was told she snores in sleep. she also  developed vertigo a year ago and still having problems with that.        Social History   Socioeconomic History  . Marital status: Married    Spouse name: Not on file  . Number of children: Not on file  . Years of education: Not on file  . Highest education level: Not on file  Occupational History  . Not on file  Social Needs  . Financial resource strain: Not on file  . Food insecurity:    Worry: Not on file    Inability: Not on file  . Transportation needs:    Medical: Not on file    Non-medical: Not on file  Tobacco Use  . Smoking status: Current Every Day Smoker    Packs/day: 0.50    Types: Cigarettes  . Smokeless tobacco: Never Used  Substance and Sexual Activity  . Alcohol use: Yes    Alcohol/week: 5.0 standard  drinks    Types: 5 Glasses of wine per week  . Drug use: No  . Sexual activity: Yes    Partners: Male    Birth control/protection: Surgical    Comment: BTL  Lifestyle  . Physical activity:    Days per week: Not on file    Minutes per session: Not on file  . Stress: Not on file  Relationships  . Social connections:    Talks on phone: Not on file    Gets together: Not on file    Attends religious service: Not on file    Active member of club or organization: Not on file    Attends meetings of clubs or organizations: Not on file    Relationship status: Not on file  . Intimate partner violence:    Fear of current or ex partner: Not on file    Emotionally abused: Not on file    Physically abused: Not on file    Forced sexual activity: Not on file  Other Topics Concern  . Not on file  Social History Narrative  . Not on file    Family History  Problem Relation Age of Onset  . Arthritis Unknown   . Diabetes Father   . Heart attack Father   . Diabetes Paternal Grandfather   . Hyperlipidemia Unknown   . Hypertension Unknown   . Stroke Unknown   . Heart disease Unknown   . Heart attack Paternal Grandmother   . Stroke Mother   . Hypertension  Mother   . Melanoma Mother

## 2018-05-06 ENCOUNTER — Encounter: Payer: Self-pay | Admitting: Neurology

## 2018-05-21 ENCOUNTER — Telehealth: Payer: Self-pay | Admitting: Family

## 2018-05-21 NOTE — Telephone Encounter (Signed)
Appointments scheduled letter/calendar mailed / [facility] notified of appointment as well

## 2018-05-22 ENCOUNTER — Telehealth: Payer: Self-pay

## 2018-05-22 NOTE — Telephone Encounter (Signed)
I have attempted twice to contact patient to schedule HST. Called on 05/07/2018 and also on 05/22/2018. Pt is unavailable at the number provided and hasn't returned our calls. Also, we tried contacting pt in December 2019, but we were unable to contact her at that time as well.

## 2018-06-03 ENCOUNTER — Other Ambulatory Visit: Payer: Self-pay | Admitting: Family

## 2018-06-03 DIAGNOSIS — D649 Anemia, unspecified: Secondary | ICD-10-CM

## 2018-06-04 ENCOUNTER — Telehealth: Payer: Self-pay | Admitting: Family

## 2018-06-04 ENCOUNTER — Inpatient Hospital Stay: Payer: 59 | Attending: Family

## 2018-06-04 ENCOUNTER — Inpatient Hospital Stay: Payer: Self-pay

## 2018-06-04 ENCOUNTER — Ambulatory Visit: Payer: Self-pay | Admitting: Family

## 2018-06-04 DIAGNOSIS — D649 Anemia, unspecified: Secondary | ICD-10-CM

## 2018-06-04 DIAGNOSIS — R42 Dizziness and giddiness: Secondary | ICD-10-CM | POA: Diagnosis not present

## 2018-06-04 DIAGNOSIS — E119 Type 2 diabetes mellitus without complications: Secondary | ICD-10-CM | POA: Insufficient documentation

## 2018-06-04 DIAGNOSIS — R002 Palpitations: Secondary | ICD-10-CM | POA: Diagnosis not present

## 2018-06-04 DIAGNOSIS — Z7984 Long term (current) use of oral hypoglycemic drugs: Secondary | ICD-10-CM | POA: Diagnosis not present

## 2018-06-04 DIAGNOSIS — R5383 Other fatigue: Secondary | ICD-10-CM | POA: Insufficient documentation

## 2018-06-04 DIAGNOSIS — G4700 Insomnia, unspecified: Secondary | ICD-10-CM | POA: Diagnosis not present

## 2018-06-04 DIAGNOSIS — D4701 Cutaneous mastocytosis: Secondary | ICD-10-CM | POA: Diagnosis not present

## 2018-06-04 DIAGNOSIS — R51 Headache: Secondary | ICD-10-CM | POA: Diagnosis not present

## 2018-06-04 DIAGNOSIS — D509 Iron deficiency anemia, unspecified: Secondary | ICD-10-CM | POA: Diagnosis not present

## 2018-06-04 DIAGNOSIS — Z9884 Bariatric surgery status: Secondary | ICD-10-CM | POA: Insufficient documentation

## 2018-06-04 DIAGNOSIS — Z79899 Other long term (current) drug therapy: Secondary | ICD-10-CM | POA: Diagnosis not present

## 2018-06-04 DIAGNOSIS — I10 Essential (primary) hypertension: Secondary | ICD-10-CM | POA: Insufficient documentation

## 2018-06-04 LAB — CMP (CANCER CENTER ONLY)
ALT: 16 U/L (ref 0–44)
AST: 15 U/L (ref 15–41)
Albumin: 4.6 g/dL (ref 3.5–5.0)
Alkaline Phosphatase: 63 U/L (ref 38–126)
Anion gap: 10 (ref 5–15)
BUN: 13 mg/dL (ref 6–20)
CO2: 26 mmol/L (ref 22–32)
Calcium: 9.3 mg/dL (ref 8.9–10.3)
Chloride: 103 mmol/L (ref 98–111)
Creatinine: 0.74 mg/dL (ref 0.44–1.00)
GFR, Est AFR Am: >60 mL/min (ref >60)
GFR, Estimated: >60 mL/min (ref >60)
Glucose, Bld: 106 mg/dL — ABNORMAL HIGH (ref 70–99)
Potassium: 3.9 mmol/L (ref 3.5–5.1)
Sodium: 139 mmol/L (ref 135–145)
Total Bilirubin: 0.3 mg/dL (ref 0.3–1.2)
Total Protein: 7.5 g/dL (ref 6.5–8.1)

## 2018-06-04 LAB — CBC WITH DIFFERENTIAL (CANCER CENTER ONLY)
Abs Immature Granulocytes: 0.05 10*3/uL (ref 0.00–0.07)
Basophils Absolute: 0.1 10*3/uL (ref 0.0–0.1)
Basophils Relative: 1 %
Eosinophils Absolute: 0.2 10*3/uL (ref 0.0–0.5)
Eosinophils Relative: 2 %
HCT: 32.5 % — ABNORMAL LOW (ref 36.0–46.0)
HEMOGLOBIN: 9.5 g/dL — AB (ref 12.0–15.0)
Immature Granulocytes: 1 %
LYMPHS ABS: 2.4 10*3/uL (ref 0.7–4.0)
LYMPHS PCT: 25 %
MCH: 21.3 pg — ABNORMAL LOW (ref 26.0–34.0)
MCHC: 29.2 g/dL — ABNORMAL LOW (ref 30.0–36.0)
MCV: 73 fL — AB (ref 80.0–100.0)
Monocytes Absolute: 0.7 10*3/uL (ref 0.1–1.0)
Monocytes Relative: 8 %
Neutro Abs: 6.1 10*3/uL (ref 1.7–7.7)
Neutrophils Relative %: 63 %
Platelet Count: 291 10*3/uL (ref 150–400)
RBC: 4.45 MIL/uL (ref 3.87–5.11)
RDW: 18.7 % — ABNORMAL HIGH (ref 11.5–15.5)
WBC Count: 9.5 10*3/uL (ref 4.0–10.5)
nRBC: 0 % (ref 0.0–0.2)

## 2018-06-04 LAB — RETICULOCYTES
Immature Retic Fract: 26.2 % — ABNORMAL HIGH (ref 2.3–15.9)
RBC.: 4.45 MIL/uL (ref 3.87–5.11)
Retic Count, Absolute: 62.7 10*3/uL (ref 19.0–186.0)
Retic Ct Pct: 1.4 % (ref 0.4–3.1)

## 2018-06-04 LAB — SAVE SMEAR (SSMR)

## 2018-06-04 LAB — SAMPLE TO BLOOD BANK

## 2018-06-04 NOTE — Telephone Encounter (Signed)
Tried calling patient regarding appointment for 3/3.  Per verbal  APP-Sarah we need to r/s due to no Provider in the office for her late PM appt.  Voicemail is full and cannot accept any new messages.  OK per Judson Roch if patient comes in we can still do her labs and just r/s her Np Appt.

## 2018-06-05 ENCOUNTER — Inpatient Hospital Stay: Payer: 59 | Admitting: Family

## 2018-06-05 ENCOUNTER — Other Ambulatory Visit: Payer: Self-pay

## 2018-06-05 ENCOUNTER — Telehealth: Payer: Self-pay | Admitting: Family

## 2018-06-05 DIAGNOSIS — Z9884 Bariatric surgery status: Secondary | ICD-10-CM

## 2018-06-05 DIAGNOSIS — R51 Headache: Secondary | ICD-10-CM

## 2018-06-05 DIAGNOSIS — R42 Dizziness and giddiness: Secondary | ICD-10-CM

## 2018-06-05 DIAGNOSIS — I10 Essential (primary) hypertension: Secondary | ICD-10-CM

## 2018-06-05 DIAGNOSIS — Z7984 Long term (current) use of oral hypoglycemic drugs: Secondary | ICD-10-CM

## 2018-06-05 DIAGNOSIS — D4701 Cutaneous mastocytosis: Secondary | ICD-10-CM | POA: Diagnosis not present

## 2018-06-05 DIAGNOSIS — R002 Palpitations: Secondary | ICD-10-CM

## 2018-06-05 DIAGNOSIS — E119 Type 2 diabetes mellitus without complications: Secondary | ICD-10-CM

## 2018-06-05 DIAGNOSIS — D508 Other iron deficiency anemias: Secondary | ICD-10-CM

## 2018-06-05 DIAGNOSIS — D509 Iron deficiency anemia, unspecified: Secondary | ICD-10-CM | POA: Diagnosis not present

## 2018-06-05 DIAGNOSIS — Z79899 Other long term (current) drug therapy: Secondary | ICD-10-CM

## 2018-06-05 DIAGNOSIS — G4700 Insomnia, unspecified: Secondary | ICD-10-CM

## 2018-06-05 DIAGNOSIS — R5383 Other fatigue: Secondary | ICD-10-CM

## 2018-06-05 LAB — FERRITIN: Ferritin: <4 ng/mL — ABNORMAL LOW (ref 11–307)

## 2018-06-05 LAB — IRON AND TIBC
Iron: 18 ug/dL — ABNORMAL LOW (ref 41–142)
Saturation Ratios: 3 % — ABNORMAL LOW (ref 21–57)
TIBC: 604 ug/dL — ABNORMAL HIGH (ref 236–444)
UIBC: 586 ug/dL — ABNORMAL HIGH (ref 120–384)

## 2018-06-05 LAB — LACTATE DEHYDROGENASE: LDH: 140 U/L (ref 98–192)

## 2018-06-05 LAB — ERYTHROPOIETIN: Erythropoietin: 76.5 m[IU]/mL — ABNORMAL HIGH (ref 2.6–18.5)

## 2018-06-05 NOTE — Telephone Encounter (Signed)
Appointments scheduled avs/calendar printed per 3/4 los
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
## 2018-06-10 ENCOUNTER — Inpatient Hospital Stay: Payer: 59

## 2018-06-10 VITALS — BP 114/65 | HR 56 | Temp 98.0°F | Resp 16

## 2018-06-10 DIAGNOSIS — D509 Iron deficiency anemia, unspecified: Secondary | ICD-10-CM | POA: Diagnosis not present

## 2018-06-10 DIAGNOSIS — D508 Other iron deficiency anemias: Secondary | ICD-10-CM

## 2018-06-10 MED ORDER — SODIUM CHLORIDE 0.9 % IV SOLN
750.0000 mg | Freq: Once | INTRAVENOUS | Status: AC
  Administered 2018-06-10: 750 mg via INTRAVENOUS
  Filled 2018-06-10: qty 15

## 2018-06-10 MED ORDER — SODIUM CHLORIDE 0.9 % IV SOLN
Freq: Once | INTRAVENOUS | Status: AC
  Administered 2018-06-10: 09:00:00 via INTRAVENOUS
  Filled 2018-06-10: qty 250

## 2018-06-10 NOTE — Patient Instructions (Signed)

## 2018-06-17 ENCOUNTER — Ambulatory Visit: Payer: Self-pay

## 2018-06-17 ENCOUNTER — Telehealth: Payer: Self-pay | Admitting: Family

## 2018-06-17 NOTE — Telephone Encounter (Signed)
Received call from patient to cancle infusion appt today due to not feeling well

## 2018-06-19 ENCOUNTER — Encounter: Payer: Self-pay | Admitting: Nurse Practitioner

## 2018-06-19 ENCOUNTER — Other Ambulatory Visit: Payer: Self-pay

## 2018-06-19 ENCOUNTER — Ambulatory Visit (INDEPENDENT_AMBULATORY_CARE_PROVIDER_SITE_OTHER): Payer: 59 | Admitting: Neurology

## 2018-06-19 DIAGNOSIS — K769 Liver disease, unspecified: Secondary | ICD-10-CM

## 2018-06-19 DIAGNOSIS — F513 Sleepwalking [somnambulism]: Secondary | ICD-10-CM

## 2018-06-19 DIAGNOSIS — G4733 Obstructive sleep apnea (adult) (pediatric): Secondary | ICD-10-CM

## 2018-06-19 DIAGNOSIS — F518 Other sleep disorders not due to a substance or known physiological condition: Secondary | ICD-10-CM

## 2018-06-19 DIAGNOSIS — G4709 Other insomnia: Secondary | ICD-10-CM

## 2018-06-19 DIAGNOSIS — G4719 Other hypersomnia: Secondary | ICD-10-CM

## 2018-06-19 DIAGNOSIS — Z720 Tobacco use: Secondary | ICD-10-CM

## 2018-06-19 DIAGNOSIS — N951 Menopausal and female climacteric states: Secondary | ICD-10-CM

## 2018-06-20 ENCOUNTER — Encounter: Payer: Self-pay | Admitting: Internal Medicine

## 2018-06-21 DIAGNOSIS — F513 Sleepwalking [somnambulism]: Secondary | ICD-10-CM | POA: Insufficient documentation

## 2018-06-21 DIAGNOSIS — G4719 Other hypersomnia: Secondary | ICD-10-CM | POA: Insufficient documentation

## 2018-06-21 DIAGNOSIS — F518 Other sleep disorders not due to a substance or known physiological condition: Secondary | ICD-10-CM | POA: Insufficient documentation

## 2018-06-21 NOTE — Procedures (Signed)
[facility] [address] [place], [place] [address] NAME: Amber Mata                                                                          DOB: [birth year] MEDICAL RECORD No: [id number]                                                DOS: 06/19/2018 REFERRING PHYSICIAN: Eldridge Abrahams, NP STUDY PERFORMED: [facility] on [employer] HISTORY: [name] is a 58 y.o. female married patient, seen on 02-27-2018 upon referral from [place] for a sleep consultation.  I have the pleasure of meeting today for the first time with Prosperity Darrough, a 58 year old Caucasian right-handed female referred by primary care Eldridge Abrahams. The patient had last seen her primary care on 06 February 2018 and was found to have an elevated HbA1c at 8.3, anemia of an unspecified type, and risk factors for obstructive sleep apnea. She has insomnia and take sleep aids. Husband reports her to snore when on her back.  She had been diagnosed with obstructive sleep apnea in the past but could not tolerate any CPAP- at the time she also could not afford CPAP.  Anemia in the past has been treated with vitamin B12 injections.  She does have some left shoulder pain which might may affect the position she can sleep in best.  She had 3 knee surgeries and is forced to sleep supine. She has occasional vertigo- more often in autumn and spring.  The patient is status post gastric bypass surgery over a decade ago, she has hypertension and she used to be a tobacco smoker (half pack per day).  She quit smoking for 18 years and then relapsed.  Chief complaint according to patient: "I am not staying asleep and I am so very tired". She works full time from home, drinks caffeine at home to stay awake, and is exhausted.  Epworth Sleepiness Score: 19/ 24 points, Fatigue severity score 46/ 63 points, Geriatric depression score 4/ 15 points.    STUDY RESULTS:  Total Recording Time: 10 h 4 mins; Calculated Sleep Time:  8 h 33 mins Total  Apnea/Hypopnea Index (AHI): 19.4 /h; RDI: 20.1/h; REM AHI: 35.1 /h Average Oxygen Saturation:   95%; Lowest Oxygen Saturation:  81 %  Total Time in Oxygen Saturation Below 89 %: 2.3 minutes. Average Heart Rate:  61 bpm (between 46 and 91 bpm). IMPRESSION:  Moderate severe OSA. Moderate snoring, intermittent bradycardia, no hypoxemia of clinical significance.  RECOMMENDATION: This patient has a choice between dental device and CPAP therapy. The patient should make this choice.  I certify that I have reviewed the raw data recording prior to the issuance of this report in accordance with the standards of the American Academy of Sleep Medicine (AASM). Larey Seat, M.D.   06-21-2018   Medical Director of [facility] at [facility], accredited by the AASM. Diplomat of the ABPN and ABSM.

## 2018-06-23 ENCOUNTER — Telehealth: Payer: Self-pay | Admitting: Hematology & Oncology

## 2018-06-23 NOTE — Telephone Encounter (Signed)
Called and pre screened patient for appointment 3/23.  She has allergy issues at this time/ NO other complaints

## 2018-06-24 ENCOUNTER — Other Ambulatory Visit: Payer: Self-pay

## 2018-06-24 ENCOUNTER — Inpatient Hospital Stay: Payer: 59

## 2018-06-24 VITALS — BP 136/77 | HR 66 | Temp 98.6°F | Resp 16

## 2018-06-24 DIAGNOSIS — D509 Iron deficiency anemia, unspecified: Secondary | ICD-10-CM | POA: Diagnosis not present

## 2018-06-24 DIAGNOSIS — D508 Other iron deficiency anemias: Secondary | ICD-10-CM

## 2018-06-24 MED ORDER — CYANOCOBALAMIN 1000 MCG/ML IJ SOLN
1000.0000 ug | Freq: Once | INTRAMUSCULAR | Status: AC
  Administered 2018-06-24: 1000 ug via INTRAMUSCULAR

## 2018-06-24 MED ORDER — CYANOCOBALAMIN 1000 MCG/ML IJ SOLN
INTRAMUSCULAR | Status: AC
  Filled 2018-06-24: qty 1

## 2018-06-24 MED ORDER — SODIUM CHLORIDE 0.9 % IV SOLN
Freq: Once | INTRAVENOUS | Status: AC
  Administered 2018-06-24: 14:00:00 via INTRAVENOUS
  Filled 2018-06-24: qty 250

## 2018-06-24 MED ORDER — SODIUM CHLORIDE 0.9 % IV SOLN
750.0000 mg | Freq: Once | INTRAVENOUS | Status: AC
  Administered 2018-06-24: 750 mg via INTRAVENOUS
  Filled 2018-06-24: qty 15

## 2018-06-24 NOTE — Patient Instructions (Signed)

## 2018-06-26 ENCOUNTER — Other Ambulatory Visit: Payer: Self-pay | Admitting: Neurology

## 2018-06-26 ENCOUNTER — Telehealth: Payer: Self-pay | Admitting: Neurology

## 2018-06-26 ENCOUNTER — Encounter: Payer: Self-pay | Admitting: Neurology

## 2018-06-26 DIAGNOSIS — G4719 Other hypersomnia: Secondary | ICD-10-CM

## 2018-06-26 DIAGNOSIS — G4730 Sleep apnea, unspecified: Secondary | ICD-10-CM

## 2018-06-26 DIAGNOSIS — G4709 Other insomnia: Secondary | ICD-10-CM

## 2018-06-26 NOTE — Telephone Encounter (Signed)
error 

## 2018-06-27 ENCOUNTER — Telehealth: Payer: Self-pay | Admitting: Neurology

## 2018-06-27 NOTE — Telephone Encounter (Signed)
I called pt. I advised pt that Dr. Brett Fairy reviewed their sleep study results and found that pt has sleep apnea. Dr. Brett Fairy recommends that pt start auto CPAP. I reviewed PAP compliance expectations with the pt. Pt is agreeable to starting a CPAP. I advised pt that an order will be sent to a DME, [facility], and [facility] will call the pt within about one week after they file with the pt's insurance. [facility] will show the pt how to use the machine, fit for masks, and troubleshoot the CPAP if needed. A follow up appt was made for insurance purposes with  on 09/09/2018. Pt verbalized understanding to arrive 15 minutes early and bring their CPAP. A letter with all of this information in it will be mailed to the pt as a reminder. I verified with the pt that the address we have on file is correct. Pt verbalized understanding of results. Pt had no questions at this time but was encouraged to call back if questions arise. I have sent the order to [facility] and have received confirmation that they have received the order.

## 2018-06-27 NOTE — Telephone Encounter (Signed)
-----   Message from Larey Seat, MD sent at 06/21/2018 11:14 AM EDT ----- IMPRESSION: Moderate severe OSA. Moderate snoring, intermittent  bradycardia, no hypoxemia of clinical significance.  RECOMMENDATION: This patient has a choice between dental device  and CPAP therapy. The patient should make this choice as she was very apprehensive of CPAP therapy.   Cc Eldridge Abrahams, NP

## 2018-07-16 ENCOUNTER — Other Ambulatory Visit: Payer: Self-pay | Admitting: Family

## 2018-07-16 DIAGNOSIS — D508 Other iron deficiency anemias: Secondary | ICD-10-CM

## 2018-07-17 ENCOUNTER — Encounter: Payer: Self-pay | Admitting: Neurology

## 2018-07-17 ENCOUNTER — Encounter: Payer: Self-pay | Admitting: Family

## 2018-07-17 ENCOUNTER — Other Ambulatory Visit: Payer: Self-pay

## 2018-07-17 ENCOUNTER — Other Ambulatory Visit: Payer: Self-pay | Admitting: Family

## 2018-07-17 ENCOUNTER — Inpatient Hospital Stay: Payer: 59 | Attending: Family

## 2018-07-17 ENCOUNTER — Ambulatory Visit: Payer: Self-pay

## 2018-07-17 ENCOUNTER — Inpatient Hospital Stay (HOSPITAL_BASED_OUTPATIENT_CLINIC_OR_DEPARTMENT_OTHER): Payer: 59 | Admitting: Family

## 2018-07-17 VITALS — BP 118/71 | HR 80 | Temp 98.4°F | Resp 18 | Ht 66.0 in | Wt 199.8 lb

## 2018-07-17 DIAGNOSIS — D509 Iron deficiency anemia, unspecified: Secondary | ICD-10-CM | POA: Insufficient documentation

## 2018-07-17 DIAGNOSIS — R5383 Other fatigue: Secondary | ICD-10-CM | POA: Diagnosis not present

## 2018-07-17 DIAGNOSIS — Z79899 Other long term (current) drug therapy: Secondary | ICD-10-CM

## 2018-07-17 DIAGNOSIS — Z7984 Long term (current) use of oral hypoglycemic drugs: Secondary | ICD-10-CM | POA: Insufficient documentation

## 2018-07-17 DIAGNOSIS — E538 Deficiency of other specified B group vitamins: Secondary | ICD-10-CM

## 2018-07-17 DIAGNOSIS — D510 Vitamin B12 deficiency anemia due to intrinsic factor deficiency: Secondary | ICD-10-CM

## 2018-07-17 DIAGNOSIS — I10 Essential (primary) hypertension: Secondary | ICD-10-CM | POA: Insufficient documentation

## 2018-07-17 DIAGNOSIS — E119 Type 2 diabetes mellitus without complications: Secondary | ICD-10-CM

## 2018-07-17 DIAGNOSIS — Z794 Long term (current) use of insulin: Secondary | ICD-10-CM

## 2018-07-17 DIAGNOSIS — D508 Other iron deficiency anemias: Secondary | ICD-10-CM

## 2018-07-17 LAB — CMP (CANCER CENTER ONLY)
ALT: 24 U/L (ref 0–44)
AST: 17 U/L (ref 15–41)
Albumin: 4.7 g/dL (ref 3.5–5.0)
Alkaline Phosphatase: 87 U/L (ref 38–126)
Anion gap: 6 (ref 5–15)
BUN: 9 mg/dL (ref 6–20)
CO2: 31 mmol/L (ref 22–32)
Calcium: 9.4 mg/dL (ref 8.9–10.3)
Chloride: 97 mmol/L — ABNORMAL LOW (ref 98–111)
Creatinine: 0.83 mg/dL (ref 0.44–1.00)
GFR, Est AFR Am: >60 mL/min (ref >60)
GFR, Estimated: >60 mL/min (ref >60)
Glucose, Bld: 128 mg/dL — ABNORMAL HIGH (ref 70–99)
Potassium: 5 mmol/L (ref 3.5–5.1)
Sodium: 134 mmol/L — ABNORMAL LOW (ref 135–145)
Total Bilirubin: 0.4 mg/dL (ref 0.3–1.2)
Total Protein: 7.7 g/dL (ref 6.5–8.1)

## 2018-07-17 LAB — VITAMIN B12: Vitamin B-12: 320 pg/mL (ref 180–914)

## 2018-07-17 LAB — CBC WITH DIFFERENTIAL (CANCER CENTER ONLY)
Abs Immature Granulocytes: 0.06 10*3/uL (ref 0.00–0.07)
Basophils Absolute: 0.1 10*3/uL (ref 0.0–0.1)
Basophils Relative: 1 %
Eosinophils Absolute: 0.3 10*3/uL (ref 0.0–0.5)
Eosinophils Relative: 3 %
HCT: 48.5 % — ABNORMAL HIGH (ref 36.0–46.0)
Hemoglobin: 15.2 g/dL — ABNORMAL HIGH (ref 12.0–15.0)
Immature Granulocytes: 1 %
Lymphocytes Relative: 28 %
Lymphs Abs: 2.2 10*3/uL (ref 0.7–4.0)
MCH: 26.1 pg (ref 26.0–34.0)
MCHC: 31.3 g/dL (ref 30.0–36.0)
MCV: 83.2 fL (ref 80.0–100.0)
Monocytes Absolute: 0.8 10*3/uL (ref 0.1–1.0)
Monocytes Relative: 10 %
Neutro Abs: 4.4 10*3/uL (ref 1.7–7.7)
Neutrophils Relative %: 57 %
Platelet Count: 203 10*3/uL (ref 150–400)
RBC: 5.83 MIL/uL — ABNORMAL HIGH (ref 3.87–5.11)
WBC Count: 7.8 10*3/uL (ref 4.0–10.5)
nRBC: 0 % (ref 0.0–0.2)

## 2018-07-17 LAB — RETICULOCYTES
Immature Retic Fract: 11 % (ref 2.3–15.9)
RBC.: 5.83 MIL/uL — ABNORMAL HIGH (ref 3.87–5.11)
Retic Count, Absolute: 96.2 10*3/uL (ref 19.0–186.0)
Retic Ct Pct: 1.7 % (ref 0.4–3.1)

## 2018-07-17 NOTE — Telephone Encounter (Signed)
Received a notification from [facility] me they havent gotten in touch with the patient.   "I have called this patient 3 times with no success in scheduling. I am voiding the sales order for the time being until we hear back from the patient."  I sent  A mychart message to the pt making her aware she will have to contact them with their number once she is ready to schedule an apt to get the machine.

## 2018-07-17 NOTE — Progress Notes (Signed)
Hematology and Oncology Follow Up Visit  Amber Mata [id number] 1960/04/04 58 y.o. 07/17/2018   Principle Diagnosis:  Iron deficiency anemia   Current Therapy:   IV iron as indicated    Interim History:  Amber Mata is here today for follow-up. She received 2 doses of IV iron in March. Iron saturation at that time was  3% and ferritin < 4. Hgb is now up to 15.2 from 9.5.  She states that her symptoms for the most part have resolved. She still has some intermittent mild fatigued and brain fog.  She has not noted any episodes of bleeding, no bruising or petechiae.  No fever, chills, n/v, cough, rash, dizziness, SOB, chest pain, palpitations, abdominal pain or changes in bowel or bladder habits.  No swelling, tenderness, numbness or tingling in her extremities.  No lymphadenopathy noted on exam.  She has maintained a good appetite and is staying well hydrated. Her weight is stable.   ECOG Performance Status: 1 - Symptomatic but completely ambulatory  Medications:  Allergies as of 07/17/2018      Reactions   Hctz [hydrochlorothiazide]    hyponatremia   Milk-related Compounds    Lactose Intolerant   Morphine And Related    Oxycodone    Itch   Prednisone    Aggressive behavior   Zolpidem    Amlodipine Itching      Medication List       Accurate as of July 17, 2018 11:11 AM. Always use your most recent med list.        [place] test strip Generic drug:  glucose blood 1 strip by Misc.(Non-Drug; Combo Route) route daily.   clonazePAM 1 MG tablet Commonly known as:  KLONOPIN   CO Q 10 PO Take by mouth.   empagliflozin 10 MG Tabs tablet Commonly known as:  JARDIANCE Take by mouth.   EPINEPHrine 0.3 mg/0.3 mL Soaj injection Commonly known as:  EpiPen Inject 0.3 mLs (0.3 mg total) into the muscle once.   escitalopram 10 MG tablet Commonly known as:  LEXAPRO Take by mouth.   Farxiga 5 MG Tabs tablet Generic drug:  dapagliflozin propanediol   Fifty50  Glucose Meter 2.0 w/Device Kit Use as instructed   folic acid 1 MG tablet Commonly known as:  FOLVITE Take 1 tablet (1 mg total) by mouth daily.   Invokana 100 MG Tabs tablet Generic drug:  canagliflozin TK 1 T PO QD   metFORMIN 500 MG tablet Commonly known as:  GLUCOPHAGE take 1 tablet by mouth once daily   metoprolol tartrate 25 MG tablet Commonly known as:  LOPRESSOR   MULTIVITAMIN ADULT PO Take by mouth.   multivitamin with minerals Tabs tablet Take 1 tablet by mouth daily.   olmesartan 20 MG tablet Commonly known as:  BENICAR Take 20 mg by mouth.   omega-3 acid ethyl esters 1 g capsule Commonly known as:  LOVAZA Take 1 g by mouth 3 (three) times daily.   Ozempic (0.25 or 0.5 MG/DOSE) 2 MG/1.5ML Sopn Generic drug:  Semaglutide(0.25 or 0.5MG/DOS)   PRESCRIPTION MEDICATION Apply 1 application topically daily as needed (Magnesium).   progesterone 200 MG capsule Commonly known as:  PROMETRIUM TAKE ONE CAPSULE BY MOUTH ONCE DAILY   progesterone 100 MG capsule Commonly known as:  PROMETRIUM TAKE 1 CAPSULE BY MOUTH DAILY AT BEDTIME.   temazepam 15 MG capsule Commonly known as:  RESTORIL Take 1 capsule (15 mg total) by mouth at bedtime as needed for sleep.  traZODone 50 MG tablet Commonly known as:  DESYREL Take by mouth.   TUMS PO Take by mouth.   varenicline 0.5 MG X 11 & 1 MG X 42 tablet Commonly known as:  CHANTIX PAK TAKE AS DIRECTED       Allergies:  Allergies  Allergen Reactions  . Hctz [Hydrochlorothiazide]     hyponatremia  . Milk-Related Compounds     Lactose Intolerant  . Morphine And Related   . Oxycodone     Itch  . Prednisone     Aggressive behavior  . Zolpidem   . Amlodipine Itching    Past Medical History, Surgical history, Social history, and Family History were reviewed and updated.  Review of Systems: All other 10 point review of systems is negative.   Physical Exam:  vitals were not taken for this visit.   Wt  Readings from Last 3 Encounters:  02/27/18 207 lb (93.9 kg)  11/13/16 226 lb (102.5 kg)  02/15/15 222 lb (100.7 kg)    Ocular: Sclerae unicteric, pupils equal, round and reactive to light Ear-nose-throat: Oropharynx clear, dentition fair Lymphatic: No cervical or supraclavicular adenopathy Lungs no rales or rhonchi, good excursion bilaterally Heart regular rate and rhythm, no murmur appreciated Abd soft, nontender, positive bowel sounds, no liver or spleen tip palpated on exam, no fluid wave  MSK no focal spinal tenderness, no joint edema Neuro: non-focal, well-oriented, appropriate affect Breasts: Deferred   Lab Results  Component Value Date   WBC 7.8 07/17/2018   HGB 15.2 (H) 07/17/2018   HCT 48.5 (H) 07/17/2018   MCV 83.2 07/17/2018   PLT 203 07/17/2018   Lab Results  Component Value Date   FERRITIN <4 (L) 06/04/2018   IRON 18 (L) 06/04/2018   TIBC 604 (H) 06/04/2018   UIBC 586 (H) 06/04/2018   IRONPCTSAT 3 (L) 06/04/2018   Lab Results  Component Value Date   RETICCTPCT 1.7 07/17/2018   RBC 5.83 (H) 07/17/2018   RBC 5.83 (H) 07/17/2018   No results found for: KPAFRELGTCHN, LAMBDASER, KAPLAMBRATIO No results found for: IGGSERUM, IGA, IGMSERUM No results found for: Odetta Pink, SPEI   Chemistry      Component Value Date/Time   NA 139 06/04/2018 1457   K 3.9 06/04/2018 1457   CL 103 06/04/2018 1457   CO2 26 06/04/2018 1457   BUN 13 06/04/2018 1457   CREATININE 0.74 06/04/2018 1457   CREATININE 0.67 03/03/2014 1137      Component Value Date/Time   CALCIUM 9.3 06/04/2018 1457   ALKPHOS 63 06/04/2018 1457   AST 15 06/04/2018 1457   ALT 16 06/04/2018 1457   BILITOT 0.3 06/04/2018 1457       Impression and Plan: Amber Mata is a very pleasant 58 yo caucasian female with iron deficiency anemia. She has responded nicely to the IV iron she received last month.  We will see what her iron studies today show.  We  will plan to see her back in another 3 months.  She will contact our office with any questions or concerns. We can certainly see her sooner if need be.   Laverna Peace, NP 4/15/202011:11 AM

## 2018-07-18 LAB — FERRITIN: Ferritin: 135 ng/mL (ref 11–307)

## 2018-07-18 LAB — IRON AND TIBC
Iron: 179 ug/dL — ABNORMAL HIGH (ref 41–142)
Saturation Ratios: 38 % (ref 21–57)
TIBC: 473 ug/dL — ABNORMAL HIGH (ref 236–444)
UIBC: 294 ug/dL (ref 120–384)

## 2018-09-02 ENCOUNTER — Telehealth: Payer: Self-pay

## 2018-09-02 NOTE — Telephone Encounter (Signed)
Unable to get in contact with the patient to convert their office visit with Amy on 09/09/2018 into a doxy.me visit. I left a voicemail asking the patient to return my call. Office number was provided.   If patient calls back please convert their office visit into a doxy.me visit.

## 2018-09-05 NOTE — Telephone Encounter (Signed)
Spoke with the patient and she stated that she hasn't received her machine yet. She stated that she lost her job in March. She mentioned that she has the same insurance and that she is going to call the DME company to get her machine. She stated that she would follow up with [place] once she receives her machine. Appt has been cancelled.

## 2018-09-09 ENCOUNTER — Ambulatory Visit: Payer: 59 | Admitting: Family Medicine

## 2018-09-23 ENCOUNTER — Other Ambulatory Visit: Payer: Self-pay | Admitting: Obstetrics & Gynecology

## 2018-09-23 DIAGNOSIS — Z1231 Encounter for screening mammogram for malignant neoplasm of breast: Secondary | ICD-10-CM

## 2018-10-16 ENCOUNTER — Inpatient Hospital Stay: Payer: 59

## 2018-10-16 ENCOUNTER — Inpatient Hospital Stay: Payer: 59 | Admitting: Family

## 2018-11-04 ENCOUNTER — Other Ambulatory Visit: Payer: Self-pay

## 2018-11-04 ENCOUNTER — Ambulatory Visit
Admission: RE | Admit: 2018-11-04 | Discharge: 2018-11-04 | Disposition: A | Discharge status: Home / Self Care | Payer: 59 | Source: Ambulatory Visit | Attending: Obstetrics & Gynecology | Admitting: Obstetrics & Gynecology

## 2018-11-04 DIAGNOSIS — Z1231 Encounter for screening mammogram for malignant neoplasm of breast: Secondary | ICD-10-CM

## 2018-11-25 ENCOUNTER — Ambulatory Visit: Payer: 59 | Admitting: Obstetrics & Gynecology

## 2019-03-03 NOTE — Progress Notes (Deleted)
58 y.o. G0P0000 Married White or Caucasian female here for annual exam.    Patient's last menstrual period was 09/28/2012.          Sexually active: {yes no:314532}  The current method of family planning is {contraception:315051}.    Exercising: {yes no:314532}  {types:19826} Smoker:  {YES P5382123  Health Maintenance: Pap:  11/13/16 Normal 02/15/15 negative, 09/04/13 negative History of abnormal Pap:  no MMG:  11/05/18 Density B Bi-rads 1 Neg  Colonoscopy:  7/12- repeat 10 years   BMD:    12/15, -0.9, -1.7 TDaP:  2011 Pneumonia vaccine(s): yes unsure of date. Shingrix:   no Hep C testing: 11/13/16 Neg  Screening Labs: PCP   reports that she has been smoking cigarettes. She has been smoking about 0.50 packs per day. She has never used smokeless tobacco. She reports current alcohol use of about 5.0 standard drinks of alcohol per week. She reports that she does not use drugs.  Past Medical History:  Diagnosis Date  . Asthma    as a child  . Depression   . Diabetes mellitus   . Glaucoma   . Hypertension   . Insomnia   . Neuropathy    fibromyalgia  . Pneumothorax    secondary to pneumonia  . Sleep apnea     Past Surgical History:  Procedure Laterality Date  . ABDOMINOPLASTY  2007  . KNEE SURGERY     left  . Mini gastric bypass  2006  . TONSILLECTOMY    . TUBAL LIGATION      Current Outpatient Medications  Medication Sig Dispense Refill  . Blood Glucose Monitoring Suppl (FIFTY50 GLUCOSE METER 2.0) w/Device KIT Use as instructed    . Calcium Carbonate Antacid (TUMS PO) Take by mouth.    . clonazePAM (KLONOPIN) 1 MG tablet     . Coenzyme Q10 (CO Q 10 PO) Take by mouth.    . empagliflozin (JARDIANCE) 10 MG TABS tablet Take by mouth.    . EPINEPHrine (EPIPEN) 0.3 mg/0.3 mL SOAJ injection Inject 0.3 mLs (0.3 mg total) into the muscle once. 2 Device prn  . escitalopram (LEXAPRO) 10 MG tablet Take by mouth.    Marland Kitchen FARXIGA 5 MG TABS tablet     . folic acid (FOLVITE) 1 MG tablet  Take 1 tablet (1 mg total) by mouth daily.    Marland Kitchen glucose blood (ACCU-CHEK ACTIVE STRIPS) test strip 1 strip by Misc.(Non-Drug; Combo Route) route daily.    . INVOKANA 100 MG TABS tablet TK 1 T PO QD    . metFORMIN (GLUCOPHAGE) 500 MG tablet take 1 tablet by mouth once daily 90 tablet 3  . metoprolol tartrate (LOPRESSOR) 25 MG tablet     . Multiple Vitamin (MULTIVITAMIN WITH MINERALS) TABS tablet Take 1 tablet by mouth daily.    . Multiple Vitamins-Minerals (MULTIVITAMIN ADULT PO) Take by mouth.    . olmesartan (BENICAR) 20 MG tablet Take 20 mg by mouth.    . omega-3 acid ethyl esters (LOVAZA) 1 G capsule Take 1 g by mouth 3 (three) times daily.    Marland Kitchen OZEMPIC, 0.25 OR 0.5 MG/DOSE, 2 MG/1.5ML SOPN     . PRESCRIPTION MEDICATION Apply 1 application topically daily as needed (Magnesium).    . progesterone (PROMETRIUM) 100 MG capsule TAKE 1 CAPSULE BY MOUTH DAILY AT BEDTIME.    . progesterone (PROMETRIUM) 200 MG capsule TAKE ONE CAPSULE BY MOUTH ONCE DAILY (Patient not taking: Reported on 07/17/2018) 90 capsule 1  . traZODone ([place])  50 MG tablet Take by mouth.    . varenicline (CHANTIX PAK) 0.5 MG X 11 & 1 MG X 42 tablet TAKE AS DIRECTED     No current facility-administered medications for this visit.     Family History  Problem Relation Age of Onset  . Arthritis Other   . Diabetes Father   . Heart attack Father   . Diabetes Paternal Grandfather   . Hyperlipidemia Other   . Hypertension Other   . Stroke Other   . Heart disease Other   . Heart attack Paternal Grandmother   . Stroke Mother   . Hypertension Mother   . Melanoma Mother     Review of Systems  Exam:   LMP 09/28/2012   Height:      Ht Readings from Last 3 Encounters:  07/17/18 '5\' 6"'  (1.676 m)  02/27/18 '5\' 6"'  (1.676 m)  11/13/16 5' 5.75" (1.67 m)    General appearance: alert, cooperative and appears stated age Head: Normocephalic, without obvious abnormality, atraumatic Neck: no adenopathy, supple, symmetrical,  trachea midline and thyroid {EXAM; THYROID:18604} Lungs: clear to auscultation bilaterally Breasts: {Exam; breast:13139::"normal appearance, no masses or tenderness"} Heart: regular rate and rhythm Abdomen: soft, non-tender; bowel sounds normal; no masses,  no organomegaly Extremities: extremities normal, atraumatic, no cyanosis or edema Skin: Skin color, texture, turgor normal. No rashes or lesions Lymph nodes: Cervical, supraclavicular, and axillary nodes normal. No abnormal inguinal nodes palpated Neurologic: Grossly normal   Pelvic: External genitalia:  no lesions              Urethra:  normal appearing urethra with no masses, tenderness or lesions              Bartholins and Skenes: normal                 Vagina: normal appearing vagina with normal color and discharge, no lesions              Cervix: {exam; cervix:14595}              Pap taken: {yes no:314532} Bimanual Exam:  Uterus:  {exam; uterus:12215}              Adnexa: {exam; adnexa:12223}               Rectovaginal: Confirms               Anus:  normal sphincter tone, no lesions  Chaperone was present for exam.  A:  Well Woman with normal exam  P:   {plan; gyn:5269::"mammogram","pap smear","return annually or prn"}

## 2019-03-13 ENCOUNTER — Ambulatory Visit: Payer: 59 | Admitting: Obstetrics & Gynecology

## 2019-04-05 IMAGING — MG 2D DIGITAL SCREENING BILATERAL MAMMOGRAM WITH CAD AND ADJUNCT TO
8 of 13 series · 8 of 29 positions shown · non-contrast
Comparison: Previous exam(s).

CLINICAL DATA: Screening.

EXAM:
2D DIGITAL SCREENING BILATERAL MAMMOGRAM WITH CAD AND ADJUNCT TOMO

[L MLO (1 of 2)]
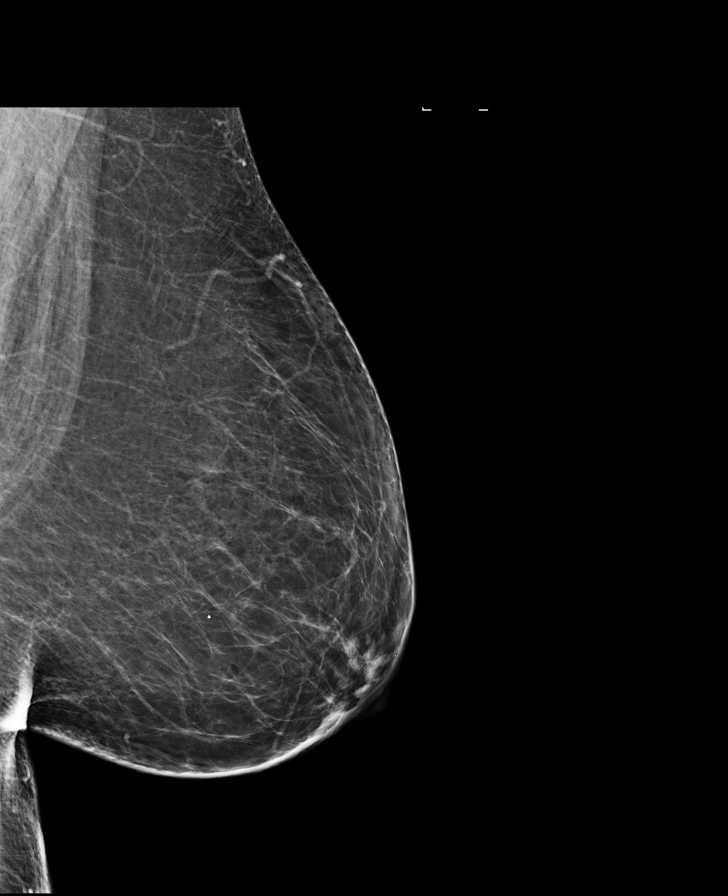

[R CC]
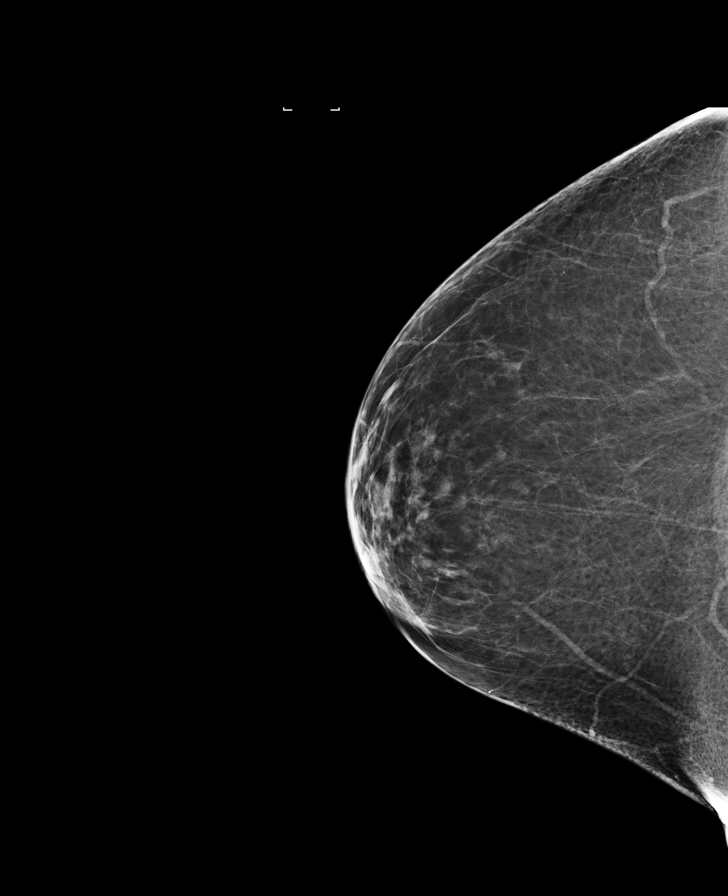

[R MLO]
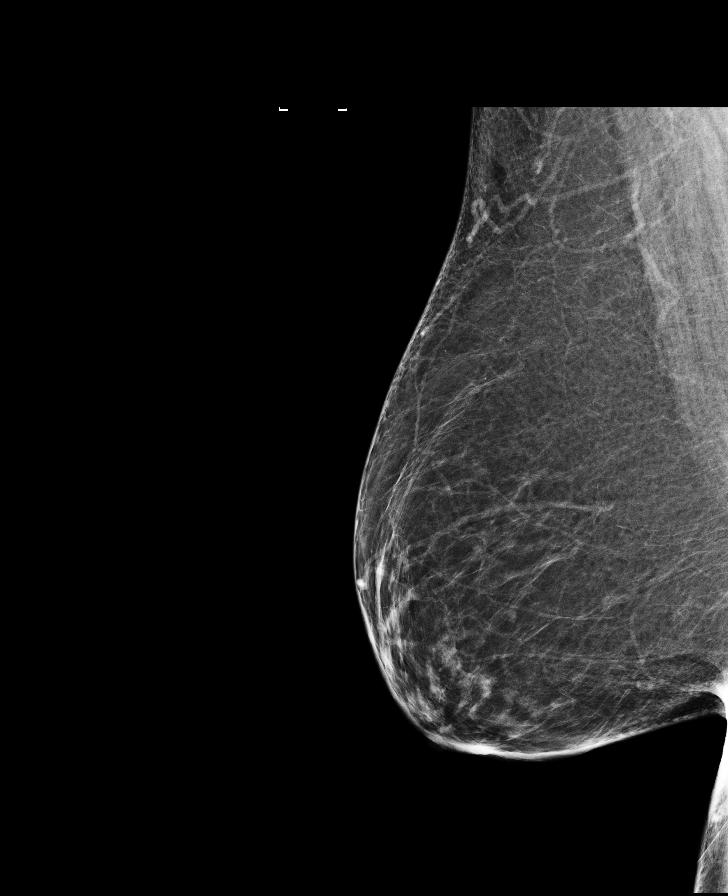

[R MLO synth-2D]
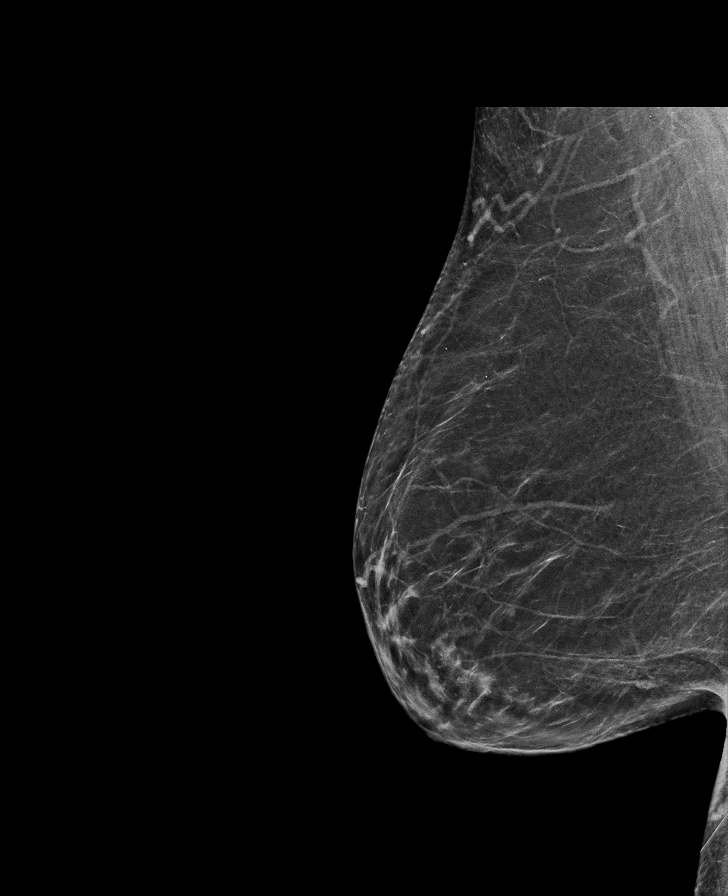

[L CC synth-2D]
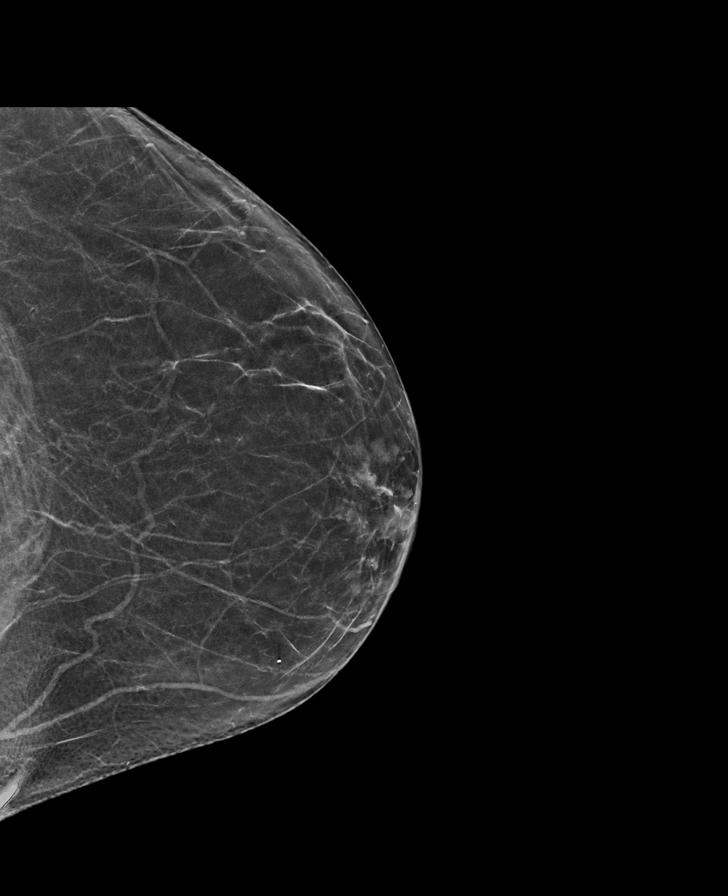

[L CC]
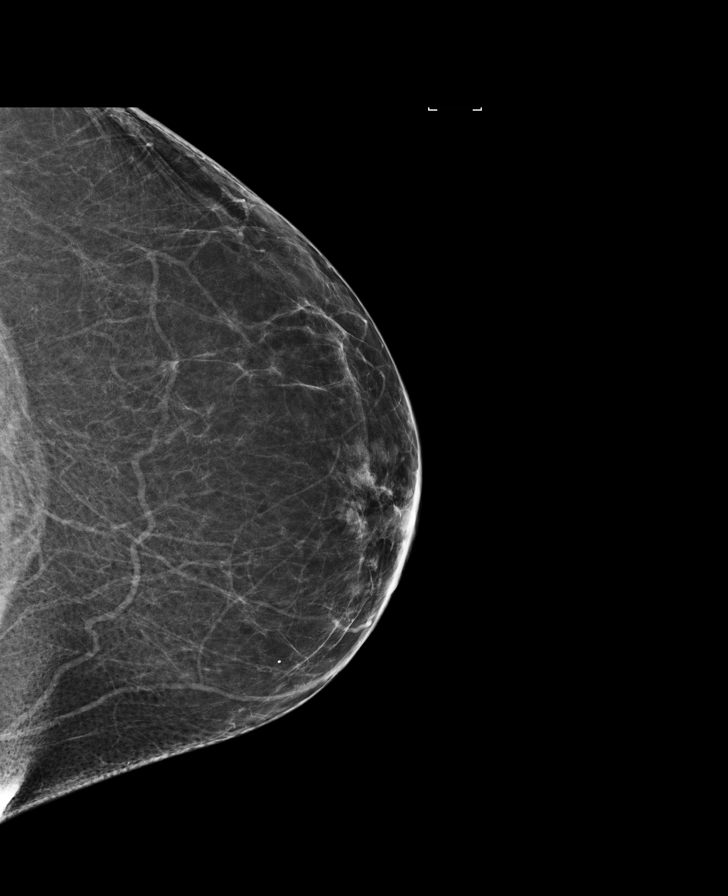

[L MLO (2 of 2)]
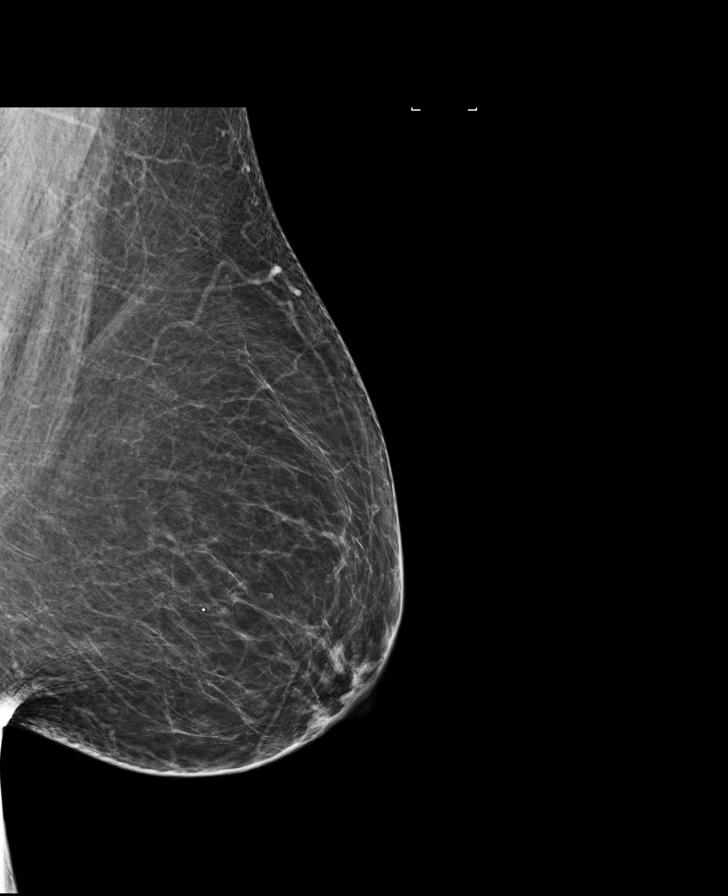

[L MLO synth-2D]
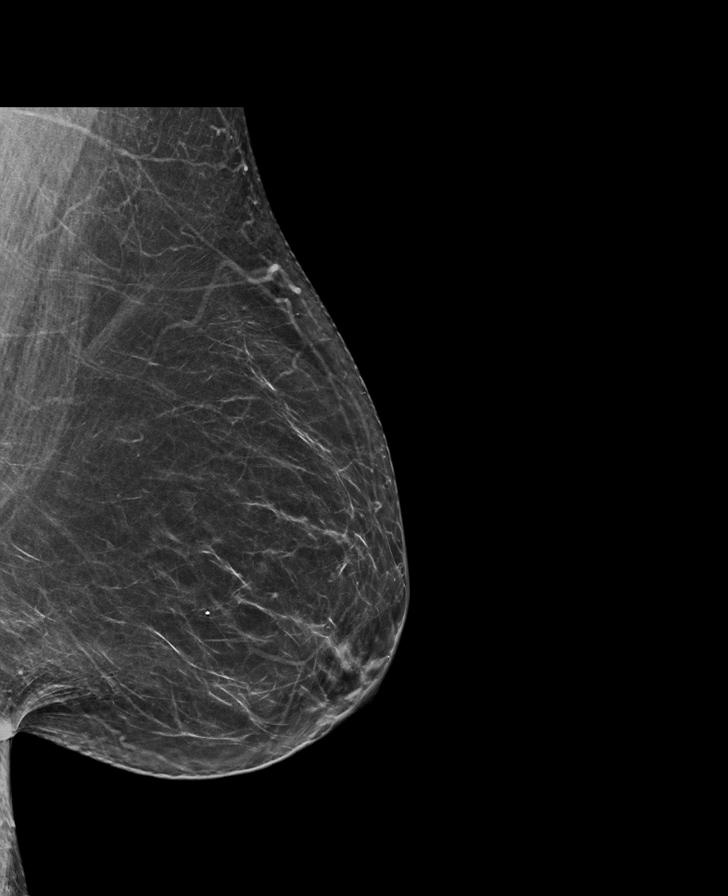

[8 of 29 positions shown; findings below may reference images not displayed]

ACR Breast Density Category b: There are scattered areas of
fibroglandular density.
FINDINGS: There are no findings suspicious for malignancy. Images were
processed with CAD.
IMPRESSION: No mammographic evidence of malignancy. A result letter of this
screening mammogram will be mailed directly to the patient.

RECOMMENDATION:
Screening mammogram in one year. (Code:97-6-RS4)

BI-RADS CATEGORY  1: Negative.

## 2019-05-23 ENCOUNTER — Other Ambulatory Visit: Payer: Self-pay | Admitting: Family

## 2019-12-15 ENCOUNTER — Other Ambulatory Visit (HOSPITAL_COMMUNITY): Payer: Self-pay | Admitting: Family Medicine

## 2019-12-15 ENCOUNTER — Other Ambulatory Visit: Payer: Self-pay | Admitting: Family Medicine

## 2019-12-15 DIAGNOSIS — Z1231 Encounter for screening mammogram for malignant neoplasm of breast: Secondary | ICD-10-CM

## 2019-12-22 ENCOUNTER — Ambulatory Visit: Payer: 59 | Admitting: Obstetrics & Gynecology

## 2019-12-26 ENCOUNTER — Ambulatory Visit: Payer: 59

## 2020-01-02 NOTE — Progress Notes (Deleted)
59 y.o. G0P0000 Married White or Caucasian female here for annual exam.    Patient's last menstrual period was 09/28/2012.          Sexually active: {yes no:314532}  The current method of family planning is {contraception:315051}.    Exercising: {yes no:314532}  {types:19826} Smoker:  {YES NO:22349}  Health Maintenance: Pap:  11-13-16 neg HPV HR neg History of abnormal Pap:  no MMG:  11-05-2018 category b density birads 1:neg Colonoscopy:  7/12 f/u 36yr BMD:   12/15 TDaP:  2011 Pneumonia vaccine(s):  no Shingrix:   no Hep C testing: neg 2018 Screening Labs: ***   reports that she has been smoking cigarettes. She has been smoking about 0.50 packs per day. She has never used smokeless tobacco. She reports current alcohol use of about 5.0 standard drinks of alcohol per week. She reports that she does not use drugs.  Past Medical History:  Diagnosis Date  . Asthma    as a child  . Depression   . Diabetes mellitus   . Glaucoma   . Hypertension   . Insomnia   . Neuropathy    fibromyalgia  . Pneumothorax    secondary to pneumonia  . Sleep apnea     Past Surgical History:  Procedure Laterality Date  . ABDOMINOPLASTY  2007  . KNEE SURGERY     left  . Mini gastric bypass  2006  . TONSILLECTOMY    . TUBAL LIGATION      Current Outpatient Medications  Medication Sig Dispense Refill  . Blood Glucose Monitoring Suppl (FIFTY50 GLUCOSE METER 2.0) w/Device KIT Use as instructed    . Calcium Carbonate Antacid (TUMS PO) Take by mouth.    . clonazePAM (KLONOPIN) 1 MG tablet     . Coenzyme Q10 (CO Q 10 PO) Take by mouth.    . empagliflozin (JARDIANCE) 10 MG TABS tablet Take by mouth.    . EPINEPHrine (EPIPEN) 0.3 mg/0.3 mL SOAJ injection Inject 0.3 mLs (0.3 mg total) into the muscle once. 2 Device prn  . escitalopram (LEXAPRO) 10 MG tablet Take by mouth.    .Marland KitchenFARXIGA 5 MG TABS tablet     . folic acid (FOLVITE) 1 MG tablet Take 1 tablet (1 mg total) by mouth daily.    .Marland Kitchenglucose  blood (ACCU-CHEK ACTIVE STRIPS) test strip 1 strip by Misc.(Non-Drug; Combo Route) route daily.    . INVOKANA 100 MG TABS tablet TK 1 T PO QD    . metFORMIN (GLUCOPHAGE) 500 MG tablet take 1 tablet by mouth once daily 90 tablet 3  . metoprolol tartrate (LOPRESSOR) 25 MG tablet     . Multiple Vitamin (MULTIVITAMIN WITH MINERALS) TABS tablet Take 1 tablet by mouth daily.    . Multiple Vitamins-Minerals (MULTIVITAMIN ADULT PO) Take by mouth.    . olmesartan (BENICAR) 20 MG tablet Take 20 mg by mouth.    . omega-3 acid ethyl esters (LOVAZA) 1 G capsule Take 1 g by mouth 3 (three) times daily.    .Marland KitchenOZEMPIC, 0.25 OR 0.5 MG/DOSE, 2 MG/1.5ML SOPN     . PRESCRIPTION MEDICATION Apply 1 application topically daily as needed (Magnesium).    . progesterone (PROMETRIUM) 100 MG capsule TAKE 1 CAPSULE BY MOUTH DAILY AT BEDTIME.    . progesterone (PROMETRIUM) 200 MG capsule TAKE ONE CAPSULE BY MOUTH ONCE DAILY (Patient not taking: Reported on 07/17/2018) 90 capsule 1  . traZODone (DESYREL) 50 MG tablet Take by mouth.    . varenicline (  CHANTIX PAK) 0.5 MG X 11 & 1 MG X 42 tablet TAKE AS DIRECTED     No current facility-administered medications for this visit.    Family History  Problem Relation Age of Onset  . Arthritis Other   . Diabetes Father   . Heart attack Father   . Diabetes Paternal Grandfather   . Hyperlipidemia Other   . Hypertension Other   . Stroke Other   . Heart disease Other   . Heart attack Paternal Grandmother   . Stroke Mother   . Hypertension Mother   . Melanoma Mother     Review of Systems  Exam:   LMP 09/28/2012      General appearance: alert, cooperative and appears stated age Head: Normocephalic, without obvious abnormality, atraumatic Neck: no adenopathy, supple, symmetrical, trachea midline and thyroid {EXAM; THYROID:18604} Lungs: clear to auscultation bilaterally Breasts: {Exam; breast:13139::"normal appearance, no masses or tenderness"} Heart: regular rate and  rhythm Abdomen: soft, non-tender; bowel sounds normal; no masses,  no organomegaly Extremities: extremities normal, atraumatic, no cyanosis or edema Skin: Skin color, texture, turgor normal. No rashes or lesions Lymph nodes: Cervical, supraclavicular, and axillary nodes normal. No abnormal inguinal nodes palpated Neurologic: Grossly normal   Pelvic: External genitalia:  no lesions              Urethra:  normal appearing urethra with no masses, tenderness or lesions              Bartholins and Skenes: normal                 Vagina: normal appearing vagina with normal color and discharge, no lesions              Cervix: {exam; cervix:14595}              Pap taken: {yes no:314532} Bimanual Exam:  Uterus:  {exam; uterus:12215}              Adnexa: {exam; adnexa:12223}               Rectovaginal: Confirms               Anus:  normal sphincter tone, no lesions  Chaperone, ***Terence Lux, CMA, was present for exam.  A:  Well Woman with normal exam  P:   {plan; gyn:5269::"mammogram","pap smear","return annually or prn"}

## 2020-01-05 ENCOUNTER — Ambulatory Visit: Payer: 59 | Admitting: Obstetrics & Gynecology

## 2020-01-05 ENCOUNTER — Other Ambulatory Visit: Payer: Self-pay

## 2020-01-07 ENCOUNTER — Ambulatory Visit: Payer: 59

## 2020-01-16 ENCOUNTER — Emergency Department (HOSPITAL_BASED_OUTPATIENT_CLINIC_OR_DEPARTMENT_OTHER)
Admission: EM | Admit: 2020-01-16 | Discharge: 2020-01-16 | Disposition: A | Discharge status: Home / Self Care | Payer: 59 | Attending: Emergency Medicine | Admitting: Emergency Medicine

## 2020-01-16 ENCOUNTER — Encounter (HOSPITAL_BASED_OUTPATIENT_CLINIC_OR_DEPARTMENT_OTHER): Payer: Self-pay | Admitting: *Deleted

## 2020-01-16 ENCOUNTER — Other Ambulatory Visit: Payer: Self-pay

## 2020-01-16 DIAGNOSIS — Z79899 Other long term (current) drug therapy: Secondary | ICD-10-CM | POA: Insufficient documentation

## 2020-01-16 DIAGNOSIS — I10 Essential (primary) hypertension: Secondary | ICD-10-CM | POA: Insufficient documentation

## 2020-01-16 DIAGNOSIS — R197 Diarrhea, unspecified: Secondary | ICD-10-CM | POA: Insufficient documentation

## 2020-01-16 DIAGNOSIS — J45909 Unspecified asthma, uncomplicated: Secondary | ICD-10-CM | POA: Insufficient documentation

## 2020-01-16 DIAGNOSIS — F17210 Nicotine dependence, cigarettes, uncomplicated: Secondary | ICD-10-CM | POA: Diagnosis not present

## 2020-01-16 DIAGNOSIS — R1110 Vomiting, unspecified: Secondary | ICD-10-CM | POA: Diagnosis present

## 2020-01-16 DIAGNOSIS — E860 Dehydration: Secondary | ICD-10-CM | POA: Insufficient documentation

## 2020-01-16 DIAGNOSIS — Z7984 Long term (current) use of oral hypoglycemic drugs: Secondary | ICD-10-CM | POA: Insufficient documentation

## 2020-01-16 DIAGNOSIS — E119 Type 2 diabetes mellitus without complications: Secondary | ICD-10-CM | POA: Diagnosis not present

## 2020-01-16 LAB — COMPREHENSIVE METABOLIC PANEL
ALT: 27 U/L (ref 0–44)
AST: 24 U/L (ref 15–41)
Albumin: 3.9 g/dL (ref 3.5–5.0)
Alkaline Phosphatase: 79 U/L (ref 38–126)
Anion gap: 12 (ref 5–15)
BUN: 12 mg/dL (ref 6–20)
CO2: 27 mmol/L (ref 22–32)
Calcium: 8.7 mg/dL — ABNORMAL LOW (ref 8.9–10.3)
Chloride: 94 mmol/L — ABNORMAL LOW (ref 98–111)
Creatinine, Ser: 0.94 mg/dL (ref 0.44–1.00)
GFR, Estimated: >60 mL/min (ref >60)
Glucose, Bld: 106 mg/dL — ABNORMAL HIGH (ref 70–99)
Potassium: 5 mmol/L (ref 3.5–5.1)
Sodium: 133 mmol/L — ABNORMAL LOW (ref 135–145)
Total Bilirubin: 0.5 mg/dL (ref 0.3–1.2)
Total Protein: 7.4 g/dL (ref 6.5–8.1)

## 2020-01-16 LAB — URINALYSIS, ROUTINE W REFLEX MICROSCOPIC
Bilirubin Urine: NEGATIVE
Glucose, UA: >=500 mg/dL — AB
Ketones, ur: NEGATIVE mg/dL
Leukocytes,Ua: NEGATIVE
Nitrite: NEGATIVE
Protein, ur: NEGATIVE mg/dL
Specific Gravity, Urine: 1.01 (ref 1.005–1.030)
pH: 5.5 (ref 5.0–8.0)

## 2020-01-16 LAB — CBC WITH DIFFERENTIAL/PLATELET
Abs Immature Granulocytes: 0.08 10*3/uL — ABNORMAL HIGH (ref 0.00–0.07)
Basophils Absolute: 0.1 10*3/uL (ref 0.0–0.1)
Basophils Relative: 1 %
Eosinophils Absolute: 0.2 10*3/uL (ref 0.0–0.5)
Eosinophils Relative: 2 %
HCT: 50.3 % — ABNORMAL HIGH (ref 36.0–46.0)
Hemoglobin: 16.9 g/dL — ABNORMAL HIGH (ref 12.0–15.0)
Immature Granulocytes: 1 %
Lymphocytes Relative: 35 %
Lymphs Abs: 3.3 10*3/uL (ref 0.7–4.0)
MCH: 31.9 pg (ref 26.0–34.0)
MCHC: 33.6 g/dL (ref 30.0–36.0)
MCV: 94.9 fL (ref 80.0–100.0)
Monocytes Absolute: 0.7 10*3/uL (ref 0.1–1.0)
Monocytes Relative: 8 %
Neutro Abs: 5 10*3/uL (ref 1.7–7.7)
Neutrophils Relative %: 53 %
Platelets: 221 10*3/uL (ref 150–400)
RBC: 5.3 MIL/uL — ABNORMAL HIGH (ref 3.87–5.11)
RDW: 13.1 % (ref 11.5–15.5)
WBC: 9.4 10*3/uL (ref 4.0–10.5)
nRBC: 0 % (ref 0.0–0.2)

## 2020-01-16 LAB — URINALYSIS, MICROSCOPIC (REFLEX)

## 2020-01-16 LAB — LIPASE, BLOOD: Lipase: 31 U/L (ref 11–51)

## 2020-01-16 LAB — PREGNANCY, URINE: Preg Test, Ur: NEGATIVE

## 2020-01-16 MED ORDER — ONDANSETRON HCL 4 MG/2ML IJ SOLN
4.0000 mg | Freq: Once | INTRAMUSCULAR | Status: AC
  Administered 2020-01-16: 4 mg via INTRAVENOUS
  Filled 2020-01-16: qty 2

## 2020-01-16 MED ORDER — FENTANYL CITRATE (PF) 100 MCG/2ML IJ SOLN
50.0000 ug | Freq: Once | INTRAMUSCULAR | Status: AC
  Administered 2020-01-16: 50 ug via INTRAVENOUS
  Filled 2020-01-16: qty 2

## 2020-01-16 MED ORDER — SODIUM CHLORIDE 0.9 % IV BOLUS
1000.0000 mL | Freq: Once | INTRAVENOUS | Status: AC
  Administered 2020-01-16: 1000 mL via INTRAVENOUS

## 2020-01-16 MED ORDER — SODIUM CHLORIDE 0.9 % IV SOLN: INTRAVENOUS | Status: DC

## 2020-01-16 NOTE — ED Provider Notes (Signed)
[facility]PART[facility] Provider Note   CSN: [id number] Arrival date & time: 01/16/20  1209     History Chief Complaint  Patient presents with  . Emesis  . Diarrhea    Amber Mata is a 59 y.o. female.  HPI   Patient has been having issues with vomiting and diarrhea for almost 2 weeks now.  Patient states she started having symptoms a couple weeks ago.  She began having episodes of vomiting and diarrhea.  She is also having abdominal pain primarily in the left side.  Patient ended up getting a CT scan performed last Saturday.  Patient states the results were negative.  Symptoms have not gotten any better despite taking oral antinausea medications.  She still had about 3 or 4 episodes of vomiting since yesterday.  She has had at least 4-5 episodes of diarrhea daily.  Patient states her symptoms get worse whenever she tries to eat or drink and she will vomit and have diarrhea.  She continues to have pain in her abdomen.  She has not had any fevers recently but did feel like she had 1 when this illness first started.  Patient went to her doctor's office and because of low blood pressure was sent to the ED for further treatment.  Past Medical History:  Diagnosis Date  . Asthma    as a child  . Depression   . Diabetes mellitus   . Glaucoma   . Hypertension   . Insomnia   . Neuropathy    fibromyalgia  . Pneumothorax    secondary to pneumonia  . Sleep apnea     Patient Active Problem List   Diagnosis Date Noted  . Excessive daytime sleepiness 06/21/2018  . 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 Abnormal dreams 06/21/2018  . IDA (iron deficiency anemia) 06/05/2018  . OSA (obstructive sleep apnea) 01/19/2016  . Tobacco abuse 03/30/2014  . Drug overdose 03/04/2014  . Normocytic anemia 03/04/2014  . 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 Controlled diabetes mellitus type II without complication ([place]) 99/83/3825  . MENOPAUSAL SYNDROME 03/10/2010  . PAIN IN  JOINT, MULTIPLE SITES 12/28/2009  . MYALGIA 12/28/2009  . ESSENTIAL HYPERTENSION 12/14/2009  . Chronic nonalcoholic liver disease 05/39/7673  . UTI'S, HX OF 12/03/2009  . INSOMNIA 11/08/2009  . DYSPHAGIA UNSPECIFIED 11/08/2009  . Depression 01/08/2009  . URINARY INCONTINENCE 01/08/2009    Past Surgical History:  Procedure Laterality Date  . ABDOMINOPLASTY  2007  . KNEE SURGERY     left  . Mini gastric bypass  2006  . TONSILLECTOMY    . TUBAL LIGATION       OB History    Gravida  0   Para  0   Term  0   Preterm  0   AB  0   Living  0     SAB  0   TAB  0   Ectopic  0   Multiple  0   Live Births              Family History  Problem Relation Age of Onset  . Arthritis Other   . Diabetes Father   . Heart attack Father   . Diabetes Paternal Grandfather   . Hyperlipidemia Other   . Hypertension Other   . Stroke Other   . Heart disease Other   . Heart attack Paternal Grandmother   . Stroke Mother   . Hypertension Mother   . Melanoma Mother  Social History   Tobacco Use  . Smoking status: Current Every Day Smoker    Packs/day: 0.50    Types: Cigarettes  . Smokeless tobacco: Never Used  Vaping Use  . Vaping Use: Never used  Substance Use Topics  . Alcohol use: Yes    Alcohol/week: 5.0 standard drinks    Types: 5 Glasses of wine per week  . Drug use: No    Home Medications Prior to Admission medications   Medication Sig Start Date End Date Taking? Authorizing Provider  Blood Glucose Monitoring Suppl (FIFTY50 GLUCOSE METER 2.0) w/Device KIT Use as instructed 05/26/15   [provider]  Calcium Carbonate Antacid (TUMS PO) Take by mouth.    [provider]  clonazePAM [place]) 1 MG tablet  05/19/18   [provider]  Coenzyme Q10 (CO Q 10 PO) Take by mouth.    [provider]  empagliflozin (JARDIANCE) 10 MG TABS tablet Take by mouth. 05/21/18   [provider]  EPINEPHrine (EPIPEN) 0.3 mg/0.3 mL  SOAJ injection Inject 0.3 mLs (0.3 mg total) into the muscle once. 03/24/13   Elby Showers, MD  escitalopram (LEXAPRO) 10 MG tablet Take by mouth. 04/30/18   [provider]  FARXIGA 5 MG TABS tablet  06/03/18   [provider]  folic acid (FOLVITE) 1 MG tablet Take 1 tablet (1 mg total) by mouth daily. 03/05/14   Mikhail, Velta Addison, DO  glucose blood (ACCU-CHEK ACTIVE STRIPS) test strip 1 strip by Misc.(Non-Drug; Combo Route) route daily. 05/10/18   [provider]  INVOKANA 100 MG TABS tablet TK 1 T PO QD 04/09/18   [provider]  metFORMIN (GLUCOPHAGE) 500 MG tablet take 1 tablet by mouth once daily 10/24/13   Elby Showers, MD  metoprolol tartrate (LOPRESSOR) 25 MG tablet  01/22/15   [provider]  Multiple Vitamin (MULTIVITAMIN WITH MINERALS) TABS tablet Take 1 tablet by mouth daily. 03/05/14   Mikhail, Velta Addison, DO  Multiple Vitamins-Minerals (MULTIVITAMIN ADULT PO) Take by mouth. 03/05/14   [provider]  olmesartan (BENICAR) 20 MG tablet Take 20 mg by mouth. 11/08/16 11/08/17  [provider]  omega-3 acid ethyl esters (LOVAZA) 1 G capsule Take 1 g by mouth 3 (three) times daily.    [provider]  OZEMPIC, 0.25 OR 0.5 MG/DOSE, 2 MG/1.5ML SOPN  01/29/18   [provider]  PRESCRIPTION MEDICATION Apply 1 application topically daily as needed (Magnesium).    [provider]  progesterone (PROMETRIUM) 100 MG capsule TAKE 1 CAPSULE BY MOUTH DAILY AT BEDTIME. 02/18/18   [provider]  progesterone ([place]) 200 MG capsule TAKE ONE CAPSULE BY MOUTH ONCE DAILY Patient not taking: Reported on 07/17/2018 02/28/16   Megan Salon, MD  traZODone (DESYREL) 50 MG tablet Take by mouth. 05/10/18   [provider]  varenicline (CHANTIX PAK) 0.5 MG X 11 & 1 MG X 42 tablet TAKE AS DIRECTED 05/10/18   [provider]    Allergies    Hctz [hydrochlorothiazide], Milk-related compounds, Morphine and  related, Prednisone, Zolpidem, Oxycodone, and Amlodipine  Review of Systems   Review of Systems  All other systems reviewed and are negative.   Physical Exam Updated Vital Signs BP 112/70 (BP Location: Right Arm)   Pulse 66   Temp 98 F (36.7 C) (Oral)   Resp 20   Ht 1.676 m ('5\' 6"' )   Wt 88 kg   LMP 09/28/2012   SpO2 94%  BMI 31.31 kg/m   Physical Exam Vitals and nursing note reviewed.  Constitutional:      General: She is not in acute distress.    Appearance: She is well-developed.  HENT:     Head: Normocephalic and atraumatic.     Right Ear: External ear normal.     Left Ear: External ear normal.  Eyes:     General: No scleral icterus.       Right eye: No discharge.        Left eye: No discharge.     Conjunctiva/sclera: Conjunctivae normal.  Neck:     Trachea: No tracheal deviation.  Cardiovascular:     Rate and Rhythm: Normal rate and regular rhythm.  Pulmonary:     Effort: Pulmonary effort is normal. No respiratory distress.     Breath sounds: Normal breath sounds. No stridor. No wheezing or rales.  Abdominal:     General: Bowel sounds are normal. There is no distension.     Palpations: Abdomen is soft.     Tenderness: There is no abdominal tenderness. There is no guarding or rebound.     Comments: Tenderness palpation left lower quadrant  Musculoskeletal:        General: No tenderness.     Cervical back: Neck supple.  Skin:    General: Skin is warm and dry.     Findings: No rash.  Neurological:     Mental Status: She is alert.     Cranial Nerves: No cranial nerve deficit (no facial droop, extraocular movements intact, no slurred speech).     Sensory: No sensory deficit.     Motor: No abnormal muscle tone or seizure activity.     Coordination: Coordination normal.     ED Results / Procedures / Treatments   Labs (all labs ordered are listed, but only abnormal results are displayed) Labs Reviewed  COMPREHENSIVE METABOLIC PANEL - Abnormal; Notable  for the following components:      Result Value   Sodium 133 (*)    Chloride 94 (*)    Glucose, Bld 106 (*)    Calcium 8.7 (*)    All other components within normal limits  CBC WITH DIFFERENTIAL/PLATELET - Abnormal; Notable for the following components:   RBC 5.30 (*)    Hemoglobin 16.9 (*)    HCT 50.3 (*)    Abs Immature Granulocytes 0.08 (*)    All other components within normal limits  C DIFFICILE QUICK SCREEN W PCR REFLEX  GASTROINTESTINAL PANEL BY PCR, STOOL (REPLACES STOOL CULTURE)  LIPASE, BLOOD  URINALYSIS, ROUTINE W REFLEX MICROSCOPIC  PREGNANCY, URINE    EKG None  Radiology No results found.  Procedures Procedures (including critical care time)  Medications Ordered in ED Medications  sodium chloride 0.9 % bolus 1,000 mL (0 mLs Intravenous Stopped 01/16/20 1545)    And  0.9 %  sodium chloride infusion ( Intravenous New Bag/Given 01/16/20 1545)  ondansetron (ZOFRAN) injection 4 mg (4 mg Intravenous Given 01/16/20 1441)  fentaNYL (SUBLIMAZE) injection 50 mcg (50 mcg Intravenous Given 01/16/20 1441)    ED Course  I have reviewed the triage vital signs and the nursing notes.  Pertinent labs & imaging results that were available during my care of the patient were reviewed by me and considered in my medical decision making (see chart for details).  Clinical Course as of Jan 15 1550  Fri Jan 16, 2020  1340 CT scan on oct 9th.  No acute findings   [JK]  [place] do not show any signs of leukocytosis.  Electrolyte panel shows mild hyponatremia   [JK]  1511 Blood pressure is improving   [JK]    Clinical Course User Index [JK] Dorie Rank, MD   MDM Rules/Calculators/A&P                         Patient presented to ED for evaluation of persistent vomiting and diarrhea.  She just had a normal CT scan done on Saturday.  Patient's laboratory test did not show any significant abnormalities.  Patient's blood pressure has improved with IV hydration.  Patient states  her abdominal pain is improving so I have low suspicion for diverticulitis or colitis especially considering the recent CT scan.  Patient is having persistent diarrhea so I have added on stool studies.  Also or waiting on urinalysis.  Will recommend outpatient follow-up with PCP and GI doctor.  Patient will try to give [place] a stool studies  Final Clinical Impression(s) / ED Diagnoses Final diagnoses:  Dehydration  Vomiting and diarrhea    Rx / DC Orders ED Discharge Orders    None       Dorie Rank, MD 01/16/20 1551

## 2020-01-16 NOTE — Discharge Instructions (Addendum)
Drink plenty of fluids, rest, follow up with your doctor, consider seeing a gi doctor

## 2020-01-16 NOTE — ED Notes (Signed)
ED Provider at bedside. 

## 2020-01-16 NOTE — ED Notes (Signed)
Pt unable to urinate at this time.  

## 2020-01-16 NOTE — ED Notes (Signed)
Pt given chicken broth and water to facilitate urine

## 2020-01-16 NOTE — ED Triage Notes (Signed)
Diarrhea, hypotension and vomiting x 2 weeks. She was seen by her MD this and sent here for further evaluation.

## 2020-01-17 ENCOUNTER — Telehealth (HOSPITAL_BASED_OUTPATIENT_CLINIC_OR_DEPARTMENT_OTHER): Payer: Self-pay | Admitting: Emergency Medicine

## 2020-01-17 LAB — GASTROINTESTINAL PANEL BY PCR, STOOL (REPLACES STOOL CULTURE)

## 2020-01-17 LAB — C DIFFICILE QUICK SCREEN W PCR REFLEX
C Diff antigen: NEGATIVE
C Diff interpretation: NOT DETECTED
C Diff toxin: NEGATIVE

## 2020-01-17 NOTE — Telephone Encounter (Signed)
This RN called patient regarding results from yesterday's ED visit, no answer. Message left to call deptartment.

## 2020-02-02 ENCOUNTER — Ambulatory Visit: Payer: 59 | Admitting: Obstetrics & Gynecology

## 2020-02-12 ENCOUNTER — Encounter (HOSPITAL_BASED_OUTPATIENT_CLINIC_OR_DEPARTMENT_OTHER): Payer: Self-pay | Admitting: Emergency Medicine

## 2020-02-12 ENCOUNTER — Inpatient Hospital Stay (HOSPITAL_BASED_OUTPATIENT_CLINIC_OR_DEPARTMENT_OTHER)
Admission: EM | Admit: 2020-02-12 | Discharge: 2020-02-16 | DRG: 419 | Disposition: A | Discharge status: Home / Self Care | Payer: 59 | Attending: Internal Medicine | Admitting: Internal Medicine

## 2020-02-12 ENCOUNTER — Inpatient Hospital Stay (HOSPITAL_COMMUNITY): Payer: 59

## 2020-02-12 ENCOUNTER — Emergency Department (HOSPITAL_BASED_OUTPATIENT_CLINIC_OR_DEPARTMENT_OTHER): Payer: 59

## 2020-02-12 ENCOUNTER — Other Ambulatory Visit: Payer: Self-pay

## 2020-02-12 DIAGNOSIS — Z823 Family history of stroke: Secondary | ICD-10-CM | POA: Diagnosis not present

## 2020-02-12 DIAGNOSIS — R1011 Right upper quadrant pain: Secondary | ICD-10-CM | POA: Diagnosis present

## 2020-02-12 DIAGNOSIS — Z7984 Long term (current) use of oral hypoglycemic drugs: Secondary | ICD-10-CM | POA: Diagnosis not present

## 2020-02-12 DIAGNOSIS — Z9884 Bariatric surgery status: Secondary | ICD-10-CM | POA: Diagnosis not present

## 2020-02-12 DIAGNOSIS — M797 Fibromyalgia: Secondary | ICD-10-CM | POA: Diagnosis present

## 2020-02-12 DIAGNOSIS — E663 Overweight: Secondary | ICD-10-CM | POA: Diagnosis present

## 2020-02-12 DIAGNOSIS — K8071 Calculus of gallbladder and bile duct without cholecystitis with obstruction: Secondary | ICD-10-CM | POA: Diagnosis not present

## 2020-02-12 DIAGNOSIS — Z91018 Allergy to other foods: Secondary | ICD-10-CM | POA: Diagnosis not present

## 2020-02-12 DIAGNOSIS — Z8249 Family history of ischemic heart disease and other diseases of the circulatory system: Secondary | ICD-10-CM

## 2020-02-12 DIAGNOSIS — F32A Depression, unspecified: Secondary | ICD-10-CM | POA: Diagnosis present

## 2020-02-12 DIAGNOSIS — Z888 Allergy status to other drugs, medicaments and biological substances status: Secondary | ICD-10-CM

## 2020-02-12 DIAGNOSIS — Z833 Family history of diabetes mellitus: Secondary | ICD-10-CM

## 2020-02-12 DIAGNOSIS — Z79899 Other long term (current) drug therapy: Secondary | ICD-10-CM | POA: Diagnosis not present

## 2020-02-12 DIAGNOSIS — G4730 Sleep apnea, unspecified: Secondary | ICD-10-CM | POA: Diagnosis present

## 2020-02-12 DIAGNOSIS — F419 Anxiety disorder, unspecified: Secondary | ICD-10-CM | POA: Diagnosis present

## 2020-02-12 DIAGNOSIS — K8020 Calculus of gallbladder without cholecystitis without obstruction: Secondary | ICD-10-CM

## 2020-02-12 DIAGNOSIS — E119 Type 2 diabetes mellitus without complications: Secondary | ICD-10-CM | POA: Diagnosis present

## 2020-02-12 DIAGNOSIS — Z66 Do not resuscitate: Secondary | ICD-10-CM | POA: Diagnosis present

## 2020-02-12 DIAGNOSIS — F17210 Nicotine dependence, cigarettes, uncomplicated: Secondary | ICD-10-CM | POA: Diagnosis present

## 2020-02-12 DIAGNOSIS — K8000 Calculus of gallbladder with acute cholecystitis without obstruction: Principal | ICD-10-CM | POA: Diagnosis present

## 2020-02-12 DIAGNOSIS — Z20822 Contact with and (suspected) exposure to covid-19: Secondary | ICD-10-CM | POA: Diagnosis present

## 2020-02-12 DIAGNOSIS — R748 Abnormal levels of other serum enzymes: Secondary | ICD-10-CM | POA: Diagnosis not present

## 2020-02-12 DIAGNOSIS — Z419 Encounter for procedure for purposes other than remedying health state, unspecified: Secondary | ICD-10-CM

## 2020-02-12 DIAGNOSIS — Z91030 Bee allergy status: Secondary | ICD-10-CM | POA: Diagnosis not present

## 2020-02-12 DIAGNOSIS — I10 Essential (primary) hypertension: Secondary | ICD-10-CM | POA: Diagnosis present

## 2020-02-12 DIAGNOSIS — Z6832 Body mass index (BMI) 32.0-32.9, adult: Secondary | ICD-10-CM | POA: Diagnosis not present

## 2020-02-12 DIAGNOSIS — Z885 Allergy status to narcotic agent status: Secondary | ICD-10-CM

## 2020-02-12 LAB — URINALYSIS, ROUTINE W REFLEX MICROSCOPIC
Bilirubin Urine: NEGATIVE
Glucose, UA: >=500 mg/dL — AB
Ketones, ur: NEGATIVE mg/dL
Leukocytes,Ua: NEGATIVE
Nitrite: NEGATIVE
Protein, ur: NEGATIVE mg/dL
Specific Gravity, Urine: 1.015 (ref 1.005–1.030)
pH: 6 (ref 5.0–8.0)

## 2020-02-12 LAB — COMPREHENSIVE METABOLIC PANEL
ALT: 18 U/L (ref 0–44)
AST: 17 U/L (ref 15–41)
Albumin: 3.5 g/dL (ref 3.5–5.0)
Alkaline Phosphatase: 53 U/L (ref 38–126)
Anion gap: 12 (ref 5–15)
BUN: 7 mg/dL (ref 6–20)
CO2: 24 mmol/L (ref 22–32)
Calcium: 8.6 mg/dL — ABNORMAL LOW (ref 8.9–10.3)
Chloride: 96 mmol/L — ABNORMAL LOW (ref 98–111)
Creatinine, Ser: 0.63 mg/dL (ref 0.44–1.00)
GFR, Estimated: >60 mL/min (ref >60)
Glucose, Bld: 128 mg/dL — ABNORMAL HIGH (ref 70–99)
Potassium: 3.8 mmol/L (ref 3.5–5.1)
Sodium: 132 mmol/L — ABNORMAL LOW (ref 135–145)
Total Bilirubin: 0.5 mg/dL (ref 0.3–1.2)
Total Protein: 6.5 g/dL (ref 6.5–8.1)

## 2020-02-12 LAB — CBC
HCT: 45.7 % (ref 36.0–46.0)
Hemoglobin: 15.1 g/dL — ABNORMAL HIGH (ref 12.0–15.0)
MCH: 32.1 pg (ref 26.0–34.0)
MCHC: 33 g/dL (ref 30.0–36.0)
MCV: 97 fL (ref 80.0–100.0)
Platelets: 179 10*3/uL (ref 150–400)
RBC: 4.71 MIL/uL (ref 3.87–5.11)
RDW: 12.3 % (ref 11.5–15.5)
WBC: 11.3 10*3/uL — ABNORMAL HIGH (ref 4.0–10.5)
nRBC: 0 % (ref 0.0–0.2)

## 2020-02-12 LAB — CBC WITH DIFFERENTIAL/PLATELET
Abs Immature Granulocytes: 0.09 10*3/uL — ABNORMAL HIGH (ref 0.00–0.07)
Basophils Absolute: 0.1 10*3/uL (ref 0.0–0.1)
Basophils Relative: 0 %
Eosinophils Absolute: 0.2 10*3/uL (ref 0.0–0.5)
Eosinophils Relative: 1 %
HCT: 44.2 % (ref 36.0–46.0)
Hemoglobin: 15.1 g/dL — ABNORMAL HIGH (ref 12.0–15.0)
Immature Granulocytes: 1 %
Lymphocytes Relative: 15 %
Lymphs Abs: 2 10*3/uL (ref 0.7–4.0)
MCH: 31.9 pg (ref 26.0–34.0)
MCHC: 34.2 g/dL (ref 30.0–36.0)
MCV: 93.4 fL (ref 80.0–100.0)
Monocytes Absolute: 0.8 10*3/uL (ref 0.1–1.0)
Monocytes Relative: 6 %
Neutro Abs: 10 10*3/uL — ABNORMAL HIGH (ref 1.7–7.7)
Neutrophils Relative %: 77 %
Platelets: 205 10*3/uL (ref 150–400)
RBC: 4.73 MIL/uL (ref 3.87–5.11)
RDW: 12.1 % (ref 11.5–15.5)
WBC: 13.2 10*3/uL — ABNORMAL HIGH (ref 4.0–10.5)
nRBC: 0 % (ref 0.0–0.2)

## 2020-02-12 LAB — CREATININE, SERUM
Creatinine, Ser: 0.74 mg/dL (ref 0.44–1.00)
GFR, Estimated: >60 mL/min (ref >60)

## 2020-02-12 LAB — RESPIRATORY PANEL BY RT PCR (FLU A&B, COVID)
Influenza A by PCR: NEGATIVE
Influenza B by PCR: NEGATIVE
SARS Coronavirus 2 by RT PCR: NEGATIVE

## 2020-02-12 LAB — URINALYSIS, MICROSCOPIC (REFLEX)

## 2020-02-12 LAB — LIPASE, BLOOD: Lipase: 25 U/L (ref 11–51)

## 2020-02-12 LAB — HIV ANTIBODY (ROUTINE TESTING W REFLEX): HIV Screen 4th Generation wRfx: NONREACTIVE

## 2020-02-12 LAB — GLUCOSE, CAPILLARY: Glucose-Capillary: 108 mg/dL — ABNORMAL HIGH (ref 70–99)

## 2020-02-12 MED ORDER — INSULIN GLARGINE 100 UNIT/ML ~~LOC~~ SOLN
5.0000 [IU] | Freq: Every day | SUBCUTANEOUS | Status: DC
  Filled 2020-02-12: qty 0.05

## 2020-02-12 MED ORDER — GADOBUTROL 1 MMOL/ML IV SOLN
9.0000 mL | Freq: Once | INTRAVENOUS | Status: AC | PRN
  Administered 2020-02-12: 9 mL via INTRAVENOUS

## 2020-02-12 MED ORDER — ACETAMINOPHEN 650 MG RE SUPP: 650.0000 mg | Freq: Four times a day (QID) | RECTAL | Status: DC | PRN

## 2020-02-12 MED ORDER — HYDROMORPHONE HCL 1 MG/ML IJ SOLN
0.5000 mg | INTRAMUSCULAR | Status: DC | PRN
  Administered 2020-02-12 – 2020-02-13 (×8): 0.5 mg via INTRAVENOUS
  Filled 2020-02-12 (×8): qty 0.5

## 2020-02-12 MED ORDER — FENTANYL CITRATE (PF) 100 MCG/2ML IJ SOLN
50.0000 ug | Freq: Once | INTRAMUSCULAR | Status: AC
  Administered 2020-02-12: 50 ug via INTRAVENOUS
  Filled 2020-02-12: qty 2

## 2020-02-12 MED ORDER — ONDANSETRON HCL 4 MG PO TABS: 4.0000 mg | ORAL_TABLET | Freq: Four times a day (QID) | ORAL | Status: DC | PRN

## 2020-02-12 MED ORDER — ONDANSETRON HCL 4 MG/2ML IJ SOLN
4.0000 mg | Freq: Once | INTRAMUSCULAR | Status: AC
  Administered 2020-02-12: 4 mg via INTRAVENOUS
  Filled 2020-02-12: qty 2

## 2020-02-12 MED ORDER — IRBESARTAN 150 MG PO TABS
150.0000 mg | ORAL_TABLET | Freq: Every day | ORAL | Status: DC
  Administered 2020-02-13: 150 mg via ORAL
  Filled 2020-02-12: qty 1

## 2020-02-12 MED ORDER — ESCITALOPRAM OXALATE 10 MG PO TABS
10.0000 mg | ORAL_TABLET | Freq: Every day | ORAL | Status: DC
  Administered 2020-02-13 – 2020-02-16 (×4): 10 mg via ORAL
  Filled 2020-02-12 (×4): qty 1

## 2020-02-12 MED ORDER — PANTOPRAZOLE SODIUM 40 MG IV SOLR
40.0000 mg | Freq: Once | INTRAVENOUS | Status: AC
  Administered 2020-02-12: 40 mg via INTRAVENOUS
  Filled 2020-02-12: qty 40

## 2020-02-12 MED ORDER — SODIUM CHLORIDE 0.9 % IV BOLUS
500.0000 mL | Freq: Once | INTRAVENOUS | Status: AC
  Administered 2020-02-12: 500 mL via INTRAVENOUS

## 2020-02-12 MED ORDER — ONDANSETRON HCL 4 MG/2ML IJ SOLN
4.0000 mg | Freq: Four times a day (QID) | INTRAMUSCULAR | Status: DC | PRN
  Administered 2020-02-16: 4 mg via INTRAVENOUS
  Filled 2020-02-12: qty 2

## 2020-02-12 MED ORDER — ACETAMINOPHEN 325 MG PO TABS: 650.0000 mg | ORAL_TABLET | Freq: Four times a day (QID) | ORAL | Status: DC | PRN

## 2020-02-12 MED ORDER — CLONAZEPAM 1 MG PO TABS
1.0000 mg | ORAL_TABLET | Freq: Every day | ORAL | Status: DC | PRN
  Administered 2020-02-13 – 2020-02-16 (×4): 1 mg via ORAL
  Filled 2020-02-12 (×5): qty 1

## 2020-02-12 MED ORDER — POTASSIUM CHLORIDE CRYS ER 20 MEQ PO TBCR
40.0000 meq | EXTENDED_RELEASE_TABLET | Freq: Once | ORAL | Status: AC
  Administered 2020-02-12: 40 meq via ORAL
  Filled 2020-02-12: qty 2

## 2020-02-12 MED ORDER — INSULIN ASPART 100 UNIT/ML ~~LOC~~ SOLN: 0.0000 [IU] | Freq: Three times a day (TID) | SUBCUTANEOUS | Status: DC

## 2020-02-12 MED ORDER — LACTATED RINGERS IV SOLN: INTRAVENOUS | Status: AC

## 2020-02-12 MED ORDER — ENOXAPARIN SODIUM 40 MG/0.4ML ~~LOC~~ SOLN
40.0000 mg | SUBCUTANEOUS | Status: DC
  Administered 2020-02-12: 40 mg via SUBCUTANEOUS
  Filled 2020-02-12: qty 0.4

## 2020-02-12 MED ORDER — METOPROLOL TARTRATE 25 MG PO TABS
25.0000 mg | ORAL_TABLET | Freq: Two times a day (BID) | ORAL | Status: DC
  Administered 2020-02-12 – 2020-02-16 (×7): 25 mg via ORAL
  Filled 2020-02-12 (×8): qty 1

## 2020-02-12 NOTE — ED Notes (Signed)
Pt putting on home nicotine patch. Dr. Tamera Punt notified and ok with application.

## 2020-02-12 NOTE — ED Provider Notes (Signed)
[facility] EMERGENCY DEPARTMENT Provider Note   CSN: [id number] Arrival date & time: 02/12/20  0734     History Chief Complaint  Patient presents with  . Abdominal Pain    Amber Mata is a 59 y.o. female.  Patient is a 59 year old female with a history of diabetes, hypertension and anxiety who presents with abdominal pain.  She had recently been treated for Shigella enterocolitis.  She started having loose stools associated with nausea and vomiting at the beginning of October.  She ultimately was evaluated in the ED and stool cultures grew out Shigella.  She completed a course of Cipro.  She also during that evaluation prior to the ED visit had had a CT scan of her abdomen and pelvis on October 9 which showed no acute abnormality.  She said that she had a follow-up visit last week with her PCP and was doing well.  Her abdominal pain had resolved and her diarrhea had resolved.  She says 2 days ago she started having pain in her right upper quadrant.  It radiates to her back.  She says it is a little worse after she eats.  She is also had a recurrence of diarrhea although she says it is different this time and much more watery.  She denies any blood in her stool.  No known fevers.  No urinary symptoms.  She has had some nausea but no vomiting.  She has a decreased appetite and general malaise.  No URI symptoms.        Past Medical History:  Diagnosis Date  . Asthma    as a child  . Depression   . Diabetes mellitus   . Glaucoma   . Hypertension   . Insomnia   . Neuropathy    fibromyalgia  . Pneumothorax    secondary to pneumonia  . Sleep apnea     Patient Active Problem List   Diagnosis Date Noted  . RUQ pain 02/12/2020  . Excessive daytime sleepiness 06/21/2018  . 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 Abnormal dreams 06/21/2018  . IDA (iron deficiency anemia) 06/05/2018  . OSA (obstructive sleep apnea) 01/19/2016  . Tobacco abuse 03/30/2014  . Drug  overdose 03/04/2014  . Normocytic anemia 03/04/2014  . 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 Controlled diabetes mellitus type II without complication ([place]) 54/65/0354  . MENOPAUSAL SYNDROME 03/10/2010  . PAIN IN JOINT, MULTIPLE SITES 12/28/2009  . MYALGIA 12/28/2009  . ESSENTIAL HYPERTENSION 12/14/2009  . Chronic nonalcoholic liver disease 65/68/1275  . UTI'S, HX OF 12/03/2009  . INSOMNIA 11/08/2009  . DYSPHAGIA UNSPECIFIED 11/08/2009  . Depression 01/08/2009  . URINARY INCONTINENCE 01/08/2009    Past Surgical History:  Procedure Laterality Date  . ABDOMINOPLASTY  2007  . KNEE SURGERY     left  . Mini gastric bypass  2006  . TONSILLECTOMY    . TUBAL LIGATION       OB History    Gravida  0   Para  0   Term  0   Preterm  0   AB  0   Living  0     SAB  0   TAB  0   Ectopic  0   Multiple  0   Live Births              Family History  Problem Relation Age of Onset  . Arthritis Other   . Diabetes Father   . Heart attack Father   .  Diabetes Paternal Grandfather   . Hyperlipidemia Other   . Hypertension Other   . Stroke Other   . Heart disease Other   . Heart attack Paternal Grandmother   . Stroke Mother   . Hypertension Mother   . Melanoma Mother     Social History   Tobacco Use  . Smoking status: Current Every Day Smoker    Packs/day: 0.50    Types: Cigarettes  . Smokeless tobacco: Never Used  Vaping Use  . Vaping Use: Never used  Substance Use Topics  . Alcohol use: Yes    Alcohol/week: 5.0 standard drinks    Types: 5 Glasses of wine per week  . Drug use: No    Home Medications Prior to Admission medications   Medication Sig Start Date End Date Taking? Authorizing Provider  Blood Glucose Monitoring Suppl (FIFTY50 GLUCOSE METER 2.0) w/Device KIT Use as instructed 05/26/15   [provider]  Calcium Carbonate Antacid (TUMS PO) Take by mouth.    [provider]  clonazePAM Bobbye [place]) 1 MG tablet  05/19/18   [provider]  Coenzyme Q10 (CO Q 10 PO) Take by mouth.    [provider]  empagliflozin (JARDIANCE) 10 MG TABS tablet Take by mouth. 05/21/18   [provider]  EPINEPHrine (EPIPEN) 0.3 mg/0.3 mL SOAJ injection Inject 0.3 mLs (0.3 mg total) into the muscle once. 03/24/13   Elby Showers, MD  escitalopram (LEXAPRO) 10 MG tablet Take by mouth. 04/30/18   [provider]  FARXIGA 5 MG TABS tablet  06/03/18   [provider]  folic acid (FOLVITE) 1 MG tablet Take 1 tablet (1 mg total) by mouth daily. 03/05/14   Mikhail, Velta Addison, DO  glucose blood (ACCU-CHEK ACTIVE STRIPS) test strip 1 strip by Misc.(Non-Drug; Combo Route) route daily. 05/10/18   [provider]  INVOKANA 100 MG TABS tablet TK 1 T PO QD 04/09/18   [provider]  metFORMIN (GLUCOPHAGE) 500 MG tablet take 1 tablet by mouth once daily 10/24/13   Elby Showers, MD  metoprolol tartrate (LOPRESSOR) 25 MG tablet  01/22/15   [provider]  Multiple Vitamin (MULTIVITAMIN WITH MINERALS) TABS tablet Take 1 tablet by mouth daily. 03/05/14   Mikhail, Velta Addison, DO  Multiple Vitamins-Minerals (MULTIVITAMIN ADULT PO) Take by mouth. 03/05/14   [provider]  olmesartan (BENICAR) 20 MG tablet Take 20 mg by mouth. 11/08/16 11/08/17  [provider]  omega-3 acid ethyl esters (LOVAZA) 1 G capsule Take 1 g by mouth 3 (three) times daily.    [provider]  OZEMPIC, 0.25 OR 0.5 MG/DOSE, 2 MG/1.5ML SOPN  01/29/18   [provider]  PRESCRIPTION MEDICATION Apply 1 application topically daily as needed (Magnesium).    [provider]  progesterone (PROMETRIUM) 100 MG capsule TAKE 1 CAPSULE BY MOUTH DAILY AT BEDTIME. 02/18/18   [provider]  progesterone ([place]) 200 MG capsule TAKE ONE CAPSULE BY MOUTH ONCE DAILY Patient not taking: Reported on 07/17/2018 02/28/16   Megan Salon, MD  traZODone (DESYREL) 50 MG tablet Take by mouth. 05/10/18    [provider]  varenicline (CHANTIX PAK) 0.5 MG X 11 & 1 MG X 42 tablet TAKE AS DIRECTED 05/10/18   [provider]    Allergies    Hctz [hydrochlorothiazide], Milk-related compounds, Morphine and related, Prednisone, Zolpidem, Oxycodone, and Amlodipine  Review of Systems   Review of Systems  Constitutional: Positive for appetite change and fatigue. Negative  for chills, diaphoresis and fever.  HENT: Negative for congestion, rhinorrhea and sneezing.   Eyes: Negative.   Respiratory: Negative for cough, chest tightness and shortness of breath.   Cardiovascular: Negative for chest pain and leg swelling.  Gastrointestinal: Positive for abdominal pain, diarrhea and nausea. Negative for blood in stool and vomiting.  Genitourinary: Negative for difficulty urinating, flank pain, frequency and hematuria.  Musculoskeletal: Negative for arthralgias and back pain.  Skin: Negative for rash.  Neurological: Negative for dizziness, speech difficulty, weakness, numbness and headaches.    Physical Exam Updated Vital Signs BP (!) 145/87   Pulse (!) 58   Temp 98.5 F (36.9 C) (Oral)   Resp 18   Ht _0  (1.676 m)   Wt 91.6 kg   LMP 09/28/2012   SpO2 96%   BMI 32.60 kg/m   Physical Exam Constitutional:      Appearance: She is well-developed.  HENT:     Head: Normocephalic and atraumatic.  Eyes:     Pupils: Pupils are equal, round, and reactive to light.  Cardiovascular:     Rate and Rhythm: Normal rate and regular rhythm.     Heart sounds: Normal heart sounds.  Pulmonary:     Effort: Pulmonary effort is normal. No respiratory distress.     Breath sounds: Normal breath sounds. No wheezing or rales.  Chest:     Chest wall: No tenderness.  Abdominal:     General: Bowel sounds are normal.     Palpations: Abdomen is soft.     Tenderness: There is abdominal tenderness in the right upper quadrant and right lower quadrant. There is no guarding or rebound.     Comments:  Tenderness to the right abdomen, mostly in the right upper quadrant but also some in the right mid and lower quadrants  Musculoskeletal:        General: Normal range of motion.     Cervical back: Normal range of motion and neck supple.  Lymphadenopathy:     Cervical: No cervical adenopathy.  Skin:    General: Skin is warm and dry.     Findings: No rash.  Neurological:     Mental Status: She is alert and oriented to person, place, and time.     ED Results / Procedures / Treatments   Labs (all labs ordered are listed, but only abnormal results are displayed) Labs Reviewed  COMPREHENSIVE METABOLIC PANEL - Abnormal; Notable for the following components:      Result Value   Sodium 132 (*)    Chloride 96 (*)    Glucose, Bld 128 (*)    Calcium 8.6 (*)    All other components within normal limits  CBC WITH DIFFERENTIAL/PLATELET - Abnormal; Notable for the following components:   WBC 13.2 (*)    Hemoglobin 15.1 (*)    Neutro Abs 10.0 (*)    Abs Immature Granulocytes 0.09 (*)    All other components within normal limits  URINALYSIS, ROUTINE W REFLEX MICROSCOPIC - Abnormal; Notable for the following components:   Glucose, UA >=500 (*)    Hgb urine dipstick SMALL (*)    All other components within normal limits  URINALYSIS, MICROSCOPIC (REFLEX) - Abnormal; Notable for the following components:   Bacteria, UA RARE (*)    All other components within normal limits  RESPIRATORY PANEL BY RT PCR (FLU A&B, COVID)  LIPASE, BLOOD    EKG None  Radiology US Abdomen Limited RUQ (LIVER/GB)  Result Date: 02/12/2020 CLINICAL DATA:  Right upper quadrant abdominal pain. EXAM: ULTRASOUND ABDOMEN LIMITED RIGHT UPPER QUADRANT COMPARISON:  January 10, 2020. FINDINGS: Gallbladder: Cholelithiasis is noted with largest calculus measuring 6 mm. No significant gallbladder wall thickening or pericholecystic fluid is noted. However, positive sonographic Murphy's sign is noted. Common bile duct: Diameter: 11  mm which is concerning for distal common bile duct obstruction. Liver: No focal lesion identified. Within normal limits in parenchymal echogenicity. Portal vein is patent on color Doppler imaging with normal direction of blood flow towards the liver. Other: None. IMPRESSION: Cholelithiasis is noted without gallbladder wall thickening or pericholecystic fluid, but positive sonographic Murphy's sign is noted. If there is clinical concern for cholecystitis, HIDA scan is recommended for further evaluation. Also noted is dilated common bile duct concerning for distal common bile duct obstruction. Correlation with liver function tests is recommended as well as possible MRCP. Electronically Signed   By: Marijo Conception M.D.   On: 02/12/2020 10:01    Procedures Procedures (including critical care time)  Medications Ordered in ED Medications  fentaNYL (SUBLIMAZE) injection 50 mcg (50 mcg Intravenous Given 02/12/20 0833)  ondansetron (ZOFRAN) injection 4 mg (4 mg Intravenous Given 02/12/20 0832)  sodium chloride 0.9 % bolus 500 mL (0 mLs Intravenous Stopped 02/12/20 0946)  potassium chloride SA (KLOR-CON) CR tablet 40 mEq (40 mEq Oral Given 02/12/20 0924)  fentaNYL (SUBLIMAZE) injection 50 mcg (50 mcg Intravenous Given 02/12/20 1014)    ED Course  I have reviewed the triage vital signs and the nursing notes.  Pertinent labs & imaging results that were available during my care of the patient were reviewed by me and considered in my medical decision making (see chart for details).    MDM Rules/Calculators/A&P                          Patient is a 59 year old female who presents with right upper quadrant abdominal pain.  This is following a recovery from Shigella colitis.  She is tender on exam to the right upper quadrant.  She has no fever.  Her white count is elevated.  Her LFTs are normal.  Ultrasound of the gallbladder shows gallstones with a positive sonographic Murphy sign.  There is also concern  for a common bile duct stone with dilatation of the common bile duct.  She has no suggestions of cholecystitis on the ultrasound.  She has had 2 doses of fentanyl with current improvement of her pain symptoms.  I spoke with the PA for [facility] gastroenterology Anderson Malta) who will see the patient in the hospital.  I spoke with Dr. Maryln Gottron with the hospitalist service who has accepted the patient for transfer to [facility]. Final Clinical Impression(s) / ED Diagnoses Final diagnoses:  RUQ pain  Calculus of gallbladder and bile duct with obstruction without cholecystitis    Rx / DC Orders ED Discharge Orders    None       Malvin Johns, MD 02/12/20 1207

## 2020-02-12 NOTE — ED Notes (Signed)
US at bedside

## 2020-02-12 NOTE — H&P (Signed)
History and Physical        Hospital Admission Note Date: 02/12/2020  Patient name: Amber Mata Medical record number: [id number] Date of birth: Sep 15, 1960 Age: 59 y.o. Gender: female  PCP: Berkley Harvey, NP  Patient coming from: home Lives with: husband At baseline, ambulates: independently  Chief Complaint    Chief Complaint  Patient presents with  . Abdominal Pain      HPI:   This is a 59 year old female with past medical history of diabetes, gastric bypass, hypertension, anxiety, recent Shigella enterocolitis s/p treatment with Cipro at the beginning of October who presented to [facility] this a.m. with complaints of abdominal pain.  She was seen by her PCP last Wednesday for Shigella follow-up and was doing well from that standpoint.  On 02/10/2020 she began having RUQ pain which radiated to her back and right shoulder blade.  Worsened by eating and associated low-grade fever.  Came to the ER due to 10/10 pain with severe nausea without vomiting.  Also with associated loose stools.  ED Course: Afebrile, hypertensive and otherwise stable on room air.  Notable labs: Sodium 132, chloride 96, glucose 128, WBC 13.2, COVID-19 negative.  RUQ [place] with cholelithiasis without gallbladder wall thickening or pericholecystic fluid but positive sonographic Murphy sign also with dilated CBD concerning for distal common bile duct obstruction.  She was given fentanyl, Zofran, Protonix, potassium and 500 cc bolus NS.  Transferred to [facility] for further GI work-up.  Vitals:   02/12/20 1501 02/12/20 1548  BP:  (!) 154/93  Pulse:  77  Resp:  16  Temp: 98.1 F (36.7 C) 99.1 F (37.3 C)  SpO2:  95%     Review of Systems:  Review of Systems  All other systems reviewed and are negative.   Medical/Social/Family History   Past Medical History: Past Medical History:  Diagnosis Date  . Asthma     as a child  . Depression   . Diabetes mellitus   . Glaucoma   . Hypertension   . Insomnia   . Neuropathy    fibromyalgia  . Pneumothorax    secondary to pneumonia  . Sleep apnea     Past Surgical History:  Procedure Laterality Date  . ABDOMINOPLASTY  2007  . KNEE SURGERY     left  . Mini gastric bypass  2006  . TONSILLECTOMY    . TUBAL LIGATION      Medications: Prior to Admission medications   Medication Sig Start Date End Date Taking? Authorizing Provider  acetaminophen (TYLENOL) 500 MG tablet Take 500 mg by mouth every 6 (six) hours as needed for moderate pain.   Yes [provider]  Calcium Carbonate Antacid (TUMS PO) Take 1 tablet by mouth daily as needed (stomach issue).    Yes [provider]  clonazePAM (KLONOPIN) 1 MG tablet Take 1 mg by mouth daily as needed for anxiety.  05/19/18  Yes [provider]  Coenzyme Q10 (CO Q 10 PO) Take 1 tablet by mouth in the morning and at bedtime.    Yes [provider]  cyanocobalamin (,VITAMIN B-12,) 1000 MCG/ML injection Inject 1,000 mcg into the muscle every 30 (thirty) days.  02/04/20  Yes  [provider]  empagliflozin (JARDIANCE) 10 MG TABS tablet Take 10 mg by mouth daily.  05/21/18  Yes [provider]  EPINEPHrine (EPIPEN) 0.3 mg/0.3 mL SOAJ injection Inject 0.3 mLs (0.3 mg total) into the muscle once. 03/24/13  Yes Baxley, Cresenciano Lick, MD  escitalopram (LEXAPRO) 10 MG tablet Take 10 mg by mouth daily.  04/30/18  Yes [provider]  metFORMIN (GLUCOPHAGE) 500 MG tablet take 1 tablet by mouth once daily Patient taking differently: Take 500 mg by mouth daily.  10/24/13  Yes Baxley, Cresenciano Lick, MD  metoprolol tartrate (LOPRESSOR) 25 MG tablet Take 25 mg by mouth 2 (two) times daily.  01/22/15  Yes [provider]  Multiple Vitamin (MULTIVITAMIN WITH MINERALS) TABS tablet Take 1 tablet by mouth daily. 03/05/14  Yes Mikhail, Maryann, DO  olmesartan (BENICAR) 20 MG tablet  Take 20 mg by mouth daily.  11/08/16 02/12/20 Yes [provider]  traMADol (ULTRAM) 50 MG tablet Take 50 mg by mouth every 6 (six) hours as needed for moderate pain.   Yes [provider]  Blood Glucose Monitoring Suppl (FIFTY50 GLUCOSE METER 2.0) w/Device KIT Use as instructed 05/26/15   [provider]  folic acid (FOLVITE) 1 MG tablet Take 1 tablet (1 mg total) by mouth daily. Patient not taking: Reported on 02/12/2020 03/05/14   Cristal Ford, DO  glucose blood (ACCU-CHEK ACTIVE STRIPS) test strip 1 strip by Misc.(Non-Drug; Combo Route) route daily. 05/10/18   [provider]  progesterone (PROMETRIUM) 200 MG capsule TAKE ONE CAPSULE BY MOUTH ONCE DAILY Patient not taking: Reported on 07/17/2018 02/28/16   Megan Salon, MD  traZODone (DESYREL) 50 MG tablet Take 50 mg by mouth 2 (two) times daily.  05/10/18   [provider]    Allergies:   Allergies  Allergen Reactions  . Bee Venom Anaphylaxis  . Hctz [Hydrochlorothiazide] Other (See Comments)    hyponatremia  . Milk-Related Compounds     Lactose Intolerant  . Morphine And Related Other (See Comments)    Patient stated,"I get mean, angry and aggressive behavior."  . Prednisone Other (See Comments)    Aggressive behavior  . Zolpidem Other (See Comments)    Patient state,"they don't help me sleep."  . Oxycodone Itching  . Amlodipine Itching  . Cantaloupe (Diagnostic) Rash    Social History:  reports that she has been smoking cigarettes. She has been smoking about 0.50 packs per day. She has never used smokeless tobacco. She reports current alcohol use of about 5.0 standard drinks of alcohol per week. She reports that she does not use drugs.  Family History: Family History  Problem Relation Age of Onset  . Arthritis Other   . Diabetes Father   . Heart attack Father   . Diabetes Paternal Grandfather   . Hyperlipidemia Other   . Hypertension Other   . Stroke Other   . Heart disease  Other   . Heart attack Paternal Grandmother   . Stroke Mother   . Hypertension Mother   . Melanoma Mother      Objective   Physical Exam: Blood pressure (!) 154/93, pulse 77, temperature 99.1 F (37.3 C), temperature source Oral, resp. rate 16, height '5\' 6"'  (1.676 m), weight 91.2 kg, last menstrual period 09/28/2012, SpO2 95 %.  Physical Exam Vitals and nursing note reviewed.  Constitutional:      Appearance: Normal appearance.  HENT:     Head: Normocephalic and atraumatic.  Eyes:  Conjunctiva/sclera: Conjunctivae normal.  Cardiovascular:     Rate and Rhythm: Normal rate and regular rhythm.  Pulmonary:     Effort: Pulmonary effort is normal.     Breath sounds: Normal breath sounds.  Abdominal:     General: Bowel sounds are normal.     Tenderness: There is abdominal tenderness in the right upper quadrant. There is guarding.  Musculoskeletal:        General: No swelling or tenderness.  Skin:    Coloration: Skin is not jaundiced or pale.  Neurological:     Mental Status: She is alert. Mental status is at baseline.  Psychiatric:        Mood and Affect: Mood normal.        Behavior: Behavior normal.     LABS on Admission: I have personally reviewed all the labs and imaging below    Basic Metabolic Panel: Recent Labs  Lab 02/12/20 0837  NA 132*  K 3.8  CL 96*  CO2 24  GLUCOSE 128*  BUN 7  CREATININE 0.63  CALCIUM 8.6*   Liver Function Tests: Recent Labs  Lab 02/12/20 0837  AST 17  ALT 18  ALKPHOS 53  BILITOT 0.5  PROT 6.5  ALBUMIN 3.5   Recent Labs  Lab 02/12/20 0837  LIPASE 25   No results for input(s): AMMONIA in the last 168 hours. CBC: Recent Labs  Lab 02/12/20 0837  WBC 13.2*  NEUTROABS 10.0*  HGB 15.1*  HCT 44.2  MCV 93.4  PLT 205   Cardiac Enzymes: No results for input(s): CKTOTAL, CKMB, CKMBINDEX, TROPONINI in the last 168 hours. BNP: Invalid input(s): POCBNP CBG: No results for input(s): GLUCAP in the last 168  hours.  Radiological Exams on Admission:  US Abdomen Limited RUQ (LIVER/GB)  Result Date: 02/12/2020 CLINICAL DATA:  Right upper quadrant abdominal pain. EXAM: ULTRASOUND ABDOMEN LIMITED RIGHT UPPER QUADRANT COMPARISON:  January 10, 2020. FINDINGS: Gallbladder: Cholelithiasis is noted with largest calculus measuring 6 mm. No significant gallbladder wall thickening or pericholecystic fluid is noted. However, positive sonographic Murphy's sign is noted. Common bile duct: Diameter: 11 mm which is concerning for distal common bile duct obstruction. Liver: No focal lesion identified. Within normal limits in parenchymal echogenicity. Portal vein is patent on color Doppler imaging with normal direction of blood flow towards the liver. Other: None. IMPRESSION: Cholelithiasis is noted without gallbladder wall thickening or pericholecystic fluid, but positive sonographic Murphy's sign is noted. If there is clinical concern for cholecystitis, HIDA scan is recommended for further evaluation. Also noted is dilated common bile duct concerning for distal common bile duct obstruction. Correlation with liver function tests is recommended as well as possible MRCP. Electronically Signed   By: Marijo Conception M.D.   On: 02/12/2020 10:01      EKG: Not done   A & P   Principal Problem:   RUQ pain Active Problems:   Depression   Essential hypertension   Controlled diabetes mellitus type II without complication ([place])   1. RUQ pain a. RUQ [place] with cholelithiasis without gallbladder wall thickening or pericholecystic fluid but positive sonographic Murphy sign also with dilated CBD concerning for distal common bile duct obstruction. b. GI consulted: MRCP to evaluate for possible choledocholithiasis.  Patient needs to remain n.p.o. for at least 4 hours prior to imaging and has been made n.p.o. except for ice chips.  Continue pain medications, monitor LFTs. c. Gentle IV fluids for now d. n.p.o. after midnight for  possible procedure  2. Diabetes a. Check HbA1c b. Glucose 120s, monitor while NPO  3. Hypertension a. Continue home lopressor and olmesartan  4. Depression a. Continue home meds   DVT prophylaxis: lovenox   Code Status: DNR  Diet: NPO Family Communication: Admission, patients condition and plan of care including tests being ordered have been discussed with the patient who indicates understanding and agrees with the plan and Code Status.  Disposition Plan: The appropriate patient status for this patient is INPATIENT. Inpatient status is judged to be reasonable and necessary in order to provide the required intensity of service to ensure the patient's safety. The patient's presenting symptoms, physical exam findings, and initial radiographic and laboratory data in the context of their chronic comorbidities is felt to place them at high risk for further clinical deterioration. Furthermore, it is not anticipated that the patient will be medically stable for discharge from the hospital within 2 midnights of admission. The following factors support the patient status of inpatient.   " The patient's presenting symptoms include RUQ pain. " The worrisome physical exam findings include RUQ pain. " The initial radiographic and laboratory data are worrisome because of cholelithiasis. " The chronic co-morbidities include hypertension, depression.   * I certify that at the point of admission it is my clinical judgment that the patient will require inpatient hospital care spanning beyond 2 midnights from the point of admission due to high intensity of service, high risk for further deterioration and high frequency of surveillance required.*   Status is: Inpatient  Remains inpatient appropriate because:Ongoing active pain requiring inpatient pain management, IV treatments appropriate due to intensity of illness or inability to take PO and Inpatient level of care appropriate due to severity of  illness   Dispo: The patient is from: Home              Anticipated d/c is to: Home              Anticipated d/c date is: 2 days              Patient currently is not medically stable to d/c.     Consultants  . GI  Procedures  . none  Time Spent on Admission: 65 minutes    Harold Hedge, DO [facility]  02/12/2020, 6:04 PM

## 2020-02-12 NOTE — ED Notes (Signed)
Report called to Larene Beach, Therapist, sports at [facility]

## 2020-02-12 NOTE — ED Triage Notes (Signed)
RUQ pain radiating into her back x 2 days. Recently treated for [facility] in her stool and has finished the abx.

## 2020-02-12 NOTE — Consult Note (Addendum)
Consultation  Referring Provider:  Dr. Neysa Bonito     Primary Care Physician:  Berkley Harvey, NP Primary Gastroenterologist:  [facility], Previously Dr. Sharlett Iles     Reason for Consultation: RUQ Pain, ?Choledocholithiasis             HPI:   Amber Mata is a 59 y.o. female with a past medical history of diabetes, gastric bypass, hypertension and anxiety, who presented to the ER at [facility] this morning with a complaint of abdominal pain.    Today, the patient describes that she has recent been treated for Shigella enterocolitis, she started having loose stools associated with nausea, vomiting at the beginning of October as well as abdominal pain, mainly in her lower abdomen that radiated through to her lower back, ultimately was evaluated in the ED and stool cultures grew out Shigella, she completed a source of Cipro.  She had follow-up with her PCP last Wednesday and was doing well from that standpoint.  Abdominal pain had resolved and her diarrhea had resolved.    Then on 02/10/2020 she started with some right upper quadrant pain which radiated into her back and up into her right shoulder blade, this was slightly worse after eating and she had a recurrence of diarrhea which was much more watery.  Describes that she is running a low-grade fever, never more than 100, tells me this is different from when she had Shigella because the pain is in a different place and she is running a constant fever at that time.  Finally presented to the ER in [facility] this morning due to a 10 /0 pain which was uncontrollable with severe nausea.  No vomiting.  Tells me the last loose stool she had was possibly this morning before proceeding to the ER.    Denies chills and vomiting.  Past Medical History:  Diagnosis Date  . Asthma    as a child  . Depression   . Diabetes mellitus   . Glaucoma   . Hypertension   . Insomnia   . Neuropathy    fibromyalgia  . Pneumothorax    secondary to  pneumonia  . Sleep apnea     Past Surgical History:  Procedure Laterality Date  . ABDOMINOPLASTY  2007  . KNEE SURGERY     left  . Mini gastric bypass  2006  . TONSILLECTOMY    . TUBAL LIGATION      Family History  Problem Relation Age of Onset  . Arthritis Other   . Diabetes Father   . Heart attack Father   . Diabetes Paternal Grandfather   . Hyperlipidemia Other   . Hypertension Other   . Stroke Other   . Heart disease Other   . Heart attack Paternal Grandmother   . Stroke Mother   . Hypertension Mother   . Melanoma Mother     Social History   Tobacco Use  . Smoking status: Current Every Day Smoker    Packs/day: 0.50    Types: Cigarettes  . Smokeless tobacco: Never Used  Vaping Use  . Vaping Use: Never used  Substance Use Topics  . Alcohol use: Yes    Alcohol/week: 5.0 standard drinks    Types: 5 Glasses of wine per week  . Drug use: No    Prior to Admission medications   Medication Sig Start Date End Date Taking? Authorizing Provider  Blood Glucose Monitoring Suppl (FIFTY50 GLUCOSE METER 2.0) w/Device KIT Use  as instructed 05/26/15   [provider]  Calcium Carbonate Antacid (TUMS PO) Take by mouth.    [provider]  clonazePAM Bobbye Charleston) 1 MG tablet  05/19/18   [provider]  Coenzyme Q10 (CO Q 10 PO) Take by mouth.    [provider]  empagliflozin (JARDIANCE) 10 MG TABS tablet Take by mouth. 05/21/18   [provider]  EPINEPHrine (EPIPEN) 0.3 mg/0.3 mL SOAJ injection Inject 0.3 mLs (0.3 mg total) into the muscle once. 03/24/13   Elby Showers, MD  escitalopram (LEXAPRO) 10 MG tablet Take by mouth. 04/30/18   [provider]  FARXIGA 5 MG TABS tablet  06/03/18   [provider]  folic acid (FOLVITE) 1 MG tablet Take 1 tablet (1 mg total) by mouth daily. 03/05/14   Mikhail, Velta Addison, DO  glucose blood (ACCU-CHEK ACTIVE STRIPS) test strip 1 strip by Misc.(Non-Drug; Combo Route) route daily.  05/10/18   [provider]  INVOKANA 100 MG TABS tablet TK 1 T PO QD 04/09/18   [provider]  metFORMIN (GLUCOPHAGE) 500 MG tablet take 1 tablet by mouth once daily 10/24/13   Elby Showers, MD  metoprolol tartrate (LOPRESSOR) 25 MG tablet  01/22/15   [provider]  Multiple Vitamin (MULTIVITAMIN WITH MINERALS) TABS tablet Take 1 tablet by mouth daily. 03/05/14   Mikhail, Velta Addison, DO  Multiple Vitamins-Minerals (MULTIVITAMIN ADULT PO) Take by mouth. 03/05/14   [provider]  olmesartan (BENICAR) 20 MG tablet Take 20 mg by mouth. 11/08/16 11/08/17  [provider]  omega-3 acid ethyl esters (LOVAZA) 1 G capsule Take 1 g by mouth 3 (three) times daily.    [provider]  OZEMPIC, 0.25 OR 0.5 MG/DOSE, 2 MG/1.5ML SOPN  01/29/18   [provider]  PRESCRIPTION MEDICATION Apply 1 application topically daily as needed (Magnesium).    [provider]  progesterone (PROMETRIUM) 100 MG capsule TAKE 1 CAPSULE BY MOUTH DAILY AT BEDTIME. 02/18/18   [provider]  progesterone (Seboyeta) 200 MG capsule TAKE ONE CAPSULE BY MOUTH ONCE DAILY Patient not taking: Reported on 07/17/2018 02/28/16   Megan Salon, MD  traZODone (DESYREL) 50 MG tablet Take by mouth. 05/10/18   [provider]  varenicline (CHANTIX PAK) 0.5 MG X 11 & 1 MG X 42 tablet TAKE AS DIRECTED 05/10/18   [provider]    No current facility-administered medications for this encounter.    Allergies as of 02/12/2020 - Review Complete 02/12/2020  Allergen Reaction Noted  . Hctz [hydrochlorothiazide] Other (See Comments) 02/15/2015  . Milk-related compounds  03/04/2014  . Morphine and related Other (See Comments) 06/28/2012  . Prednisone Other (See Comments) 02/15/2015  . Zolpidem Other (See Comments) 02/15/2015  . Oxycodone Itching 12/23/2013  . Amlodipine Itching 02/15/2015     Review of Systems:    Constitutional: No weight loss or  chills Skin: No rash  Cardiovascular: No chest pain Respiratory: No SOB  Gastrointestinal: See HPI and otherwise negative Genitourinary: No dysuria Neurological: No headache, dizziness or syncope Musculoskeletal: No new muscle or joint pain Hematologic: No bleeding  Psychiatric: No history of depression or anxiety    Physical Exam:  Vital signs in last 24 hours: Temp:  [98.1 F (36.7 C)-98.5 F (36.9 C)] 98.1 F (36.7 C) (11/11 1501) Pulse Rate:  [53-72] 72 (11/11 1400) Resp:  [14-18] 18 (11/11 1400) BP: (124-176)/(74-118) 153/118 (11/11 1400) SpO2:  [93 %-99 %] 93 % (11/11 1400) Weight:  [  91.6 kg] 91.6 kg (11/11 0740)   General:   Pleasant overweight Caucasian female appears to be in NAD, Well developed, Well nourished, alert and cooperative Head:  Normocephalic and atraumatic. Eyes:   PEERL, EOMI. No icterus. Conjunctiva pink. Ears:  Normal auditory acuity. Neck:  Supple Throat: Oral cavity and pharynx without inflammation, swelling or lesion.  Lungs: Respirations even and unlabored. Lungs clear to auscultation bilaterally.   No wheezes, crackles, or rhonchi.  Heart: Normal S1, S2. No MRG. Regular rate and rhythm. No peripheral edema, cyanosis or pallor.  Abdomen:  Soft, mild distention, moderate right upper quadrant with some involuntary guarding. Normal bowel sounds. No appreciable masses or hepatomegaly. Rectal:  Not performed.  Msk:  Symmetrical without gross deformities. Peripheral pulses intact.  Extremities:  Without edema, no deformity or joint abnormality.  Neurologic:  Alert and  oriented x4;  grossly normal neurologically.  Skin:   Dry and intact without significant lesions or rashes. Psychiatric: Demonstrates good judgement and reason without abnormal affect or behaviors.   LAB RESULTS: Recent Labs    02/12/20 0837  WBC 13.2*  HGB 15.1*  HCT 44.2  PLT 205   BMET Recent Labs    02/12/20 0837  NA 132*  K 3.8  CL 96*  CO2 24  GLUCOSE 128*  BUN 7    CREATININE 0.63  CALCIUM 8.6*   LFT Recent Labs    02/12/20 0837  PROT 6.5  ALBUMIN 3.5  AST 17  ALT 18  ALKPHOS 53  BILITOT 0.5   STUDIES: US Abdomen Limited RUQ (LIVER/GB)  Result Date: 02/12/2020 CLINICAL DATA:  Right upper quadrant abdominal pain. EXAM: ULTRASOUND ABDOMEN LIMITED RIGHT UPPER QUADRANT COMPARISON:  January 10, 2020. FINDINGS: Gallbladder: Cholelithiasis is noted with largest calculus measuring 6 mm. No significant gallbladder wall thickening or pericholecystic fluid is noted. However, positive sonographic Murphy's sign is noted. Common bile duct: Diameter: 11 mm which is concerning for distal common bile duct obstruction. Liver: No focal lesion identified. Within normal limits in parenchymal echogenicity. Portal vein is patent on color Doppler imaging with normal direction of blood flow towards the liver. Other: None. IMPRESSION: Cholelithiasis is noted without gallbladder wall thickening or pericholecystic fluid, but positive sonographic Murphy's sign is noted. If there is clinical concern for cholecystitis, HIDA scan is recommended for further evaluation. Also noted is dilated common bile duct concerning for distal common bile duct obstruction. Correlation with liver function tests is recommended as well as possible MRCP. Electronically Signed   By: Marijo Conception M.D.   On: 02/12/2020 10:01    Impression / Plan:   Impression: 1.  Right upper quadrant pain: Ultrasound showing cholelithiasis without wall thickening or pericholecystic fluid, but positive sonographic Murphy sign, also dilated common bile duct concerning for distal common bile duct obstruction, 11 mm, normal LFTs 2.  Loose stools 3.  Abnormal ultrasound of the right upper quadrant  Plan: 1.  Recommend MRCP for further evaluation of possible choledocholithiasis. 2.  Per MRI patient needs to remain n.p.o. for at least 4 hours prior to imaging. She has been made NPO except for ice chips 3.  Continue pain  medications 4.  Continue to monitor LFTs 5.  Please awaiting further recommendations from Dr. Tarri Glenn later today.  Thank you for your kind consultation, we will continue to follow.  Lavone Nian [facility]  02/12/2020, 3:47 PM

## 2020-02-12 NOTE — ED Notes (Signed)
Report given to Aggie Hacker

## 2020-02-12 NOTE — ED Notes (Signed)
Patient is resting comfortably. 

## 2020-02-12 NOTE — ED Notes (Signed)
Pt complaining of worsening pain/nausea, provider notified

## 2020-02-13 ENCOUNTER — Encounter (HOSPITAL_COMMUNITY): Payer: Self-pay | Admitting: Internal Medicine

## 2020-02-13 ENCOUNTER — Inpatient Hospital Stay (HOSPITAL_COMMUNITY): Payer: 59 | Admitting: Certified Registered Nurse Anesthetist

## 2020-02-13 ENCOUNTER — Inpatient Hospital Stay (HOSPITAL_COMMUNITY): Payer: 59

## 2020-02-13 ENCOUNTER — Encounter (HOSPITAL_COMMUNITY)
Admission: EM | Disposition: A | Discharge status: Home / Self Care | Payer: Self-pay | Source: Home / Self Care | Attending: Internal Medicine

## 2020-02-13 DIAGNOSIS — K8020 Calculus of gallbladder without cholecystitis without obstruction: Secondary | ICD-10-CM | POA: Diagnosis not present

## 2020-02-13 DIAGNOSIS — R1011 Right upper quadrant pain: Secondary | ICD-10-CM | POA: Diagnosis not present

## 2020-02-13 HISTORY — PX: CHOLECYSTECTOMY: SHX55

## 2020-02-13 LAB — CBC
HCT: 43.8 % (ref 36.0–46.0)
Hemoglobin: 14.5 g/dL (ref 12.0–15.0)
MCH: 32.3 pg (ref 26.0–34.0)
MCHC: 33.1 g/dL (ref 30.0–36.0)
MCV: 97.6 fL (ref 80.0–100.0)
Platelets: 163 10*3/uL (ref 150–400)
RBC: 4.49 MIL/uL (ref 3.87–5.11)
RDW: 12.3 % (ref 11.5–15.5)
WBC: 11.5 10*3/uL — ABNORMAL HIGH (ref 4.0–10.5)
nRBC: 0 % (ref 0.0–0.2)

## 2020-02-13 LAB — HEMOGLOBIN A1C
Hgb A1c MFr Bld: 6.3 % — ABNORMAL HIGH (ref 4.8–5.6)
Mean Plasma Glucose: 134.11 mg/dL

## 2020-02-13 LAB — COMPREHENSIVE METABOLIC PANEL
ALT: 18 U/L (ref 0–44)
AST: 15 U/L (ref 15–41)
Albumin: 3.4 g/dL — ABNORMAL LOW (ref 3.5–5.0)
Alkaline Phosphatase: 56 U/L (ref 38–126)
Anion gap: 10 (ref 5–15)
BUN: 8 mg/dL (ref 6–20)
CO2: 26 mmol/L (ref 22–32)
Calcium: 8.5 mg/dL — ABNORMAL LOW (ref 8.9–10.3)
Chloride: 99 mmol/L (ref 98–111)
Creatinine, Ser: 0.69 mg/dL (ref 0.44–1.00)
GFR, Estimated: >60 mL/min (ref >60)
Glucose, Bld: 113 mg/dL — ABNORMAL HIGH (ref 70–99)
Potassium: 4.3 mmol/L (ref 3.5–5.1)
Sodium: 135 mmol/L (ref 135–145)
Total Bilirubin: 1 mg/dL (ref 0.3–1.2)
Total Protein: 6.7 g/dL (ref 6.5–8.1)

## 2020-02-13 LAB — SURGICAL PCR SCREEN
MRSA, PCR: NEGATIVE
Staphylococcus aureus: NEGATIVE

## 2020-02-13 LAB — GLUCOSE, CAPILLARY
Glucose-Capillary: 105 mg/dL — ABNORMAL HIGH (ref 70–99)
Glucose-Capillary: 96 mg/dL (ref 70–99)
Glucose-Capillary: 92 mg/dL (ref 70–99)
Glucose-Capillary: 156 mg/dL — ABNORMAL HIGH (ref 70–99)
Glucose-Capillary: 137 mg/dL — ABNORMAL HIGH (ref 70–99)

## 2020-02-13 SURGERY — LAPAROSCOPIC CHOLECYSTECTOMY WITH INTRAOPERATIVE CHOLANGIOGRAM
Anesthesia: General | Site: Abdomen

## 2020-02-13 MED ORDER — MIDAZOLAM HCL 5 MG/5ML IJ SOLN
INTRAMUSCULAR | Status: DC | PRN
  Administered 2020-02-13: 2 mg via INTRAVENOUS

## 2020-02-13 MED ORDER — IRBESARTAN 150 MG PO TABS
150.0000 mg | ORAL_TABLET | Freq: Every day | ORAL | Status: DC
  Administered 2020-02-14: 150 mg via ORAL
  Filled 2020-02-13 (×2): qty 1

## 2020-02-13 MED ORDER — BUPIVACAINE-EPINEPHRINE (PF) 0.5% -1:200000 IJ SOLN
INTRAMUSCULAR | Status: AC
  Filled 2020-02-13: qty 30

## 2020-02-13 MED ORDER — AMISULPRIDE (ANTIEMETIC) 5 MG/2ML IV SOLN: 10.0000 mg | Freq: Once | INTRAVENOUS | Status: DC | PRN

## 2020-02-13 MED ORDER — KETOROLAC TROMETHAMINE 30 MG/ML IJ SOLN
15.0000 mg | Freq: Three times a day (TID) | INTRAMUSCULAR | Status: DC
  Administered 2020-02-13 – 2020-02-16 (×8): 15 mg via INTRAVENOUS
  Filled 2020-02-13 (×8): qty 1

## 2020-02-13 MED ORDER — 0.9 % SODIUM CHLORIDE (POUR BTL) OPTIME
TOPICAL | Status: DC | PRN
  Administered 2020-02-13: 1000 mL

## 2020-02-13 MED ORDER — BUPIVACAINE-EPINEPHRINE (PF) 0.5% -1:200000 IJ SOLN
INTRAMUSCULAR | Status: DC | PRN
  Administered 2020-02-13: 30 mL via PERINEURAL

## 2020-02-13 MED ORDER — DEXAMETHASONE SODIUM PHOSPHATE 10 MG/ML IJ SOLN
INTRAMUSCULAR | Status: DC | PRN
  Administered 2020-02-13: 8 mg via INTRAVENOUS

## 2020-02-13 MED ORDER — PHENYLEPHRINE 40 MCG/ML (10ML) SYRINGE FOR IV PUSH (FOR BLOOD PRESSURE SUPPORT)
PREFILLED_SYRINGE | INTRAVENOUS | Status: DC | PRN
  Administered 2020-02-13: 80 ug via INTRAVENOUS
  Administered 2020-02-13 (×2): 120 ug via INTRAVENOUS
  Administered 2020-02-13: 80 ug via INTRAVENOUS
  Administered 2020-02-13: 120 ug via INTRAVENOUS
  Administered 2020-02-13 (×3): 80 ug via INTRAVENOUS
  Administered 2020-02-13: 120 ug via INTRAVENOUS
  Administered 2020-02-13: 40 ug via INTRAVENOUS
  Administered 2020-02-13: 120 ug via INTRAVENOUS

## 2020-02-13 MED ORDER — SUGAMMADEX SODIUM 200 MG/2ML IV SOLN
INTRAVENOUS | Status: DC | PRN
  Administered 2020-02-13: 200 mg via INTRAVENOUS

## 2020-02-13 MED ORDER — CHLORHEXIDINE GLUCONATE 0.12 % MT SOLN
15.0000 mL | OROMUCOSAL | Status: AC
  Administered 2020-02-13: 15 mL via OROMUCOSAL

## 2020-02-13 MED ORDER — KETOROLAC TROMETHAMINE 30 MG/ML IJ SOLN: 30.0000 mg | Freq: Once | INTRAMUSCULAR | Status: DC | PRN

## 2020-02-13 MED ORDER — ENOXAPARIN SODIUM 40 MG/0.4ML ~~LOC~~ SOLN
40.0000 mg | SUBCUTANEOUS | Status: DC
  Administered 2020-02-14 – 2020-02-15 (×2): 40 mg via SUBCUTANEOUS
  Filled 2020-02-13 (×2): qty 0.4

## 2020-02-13 MED ORDER — HYDROMORPHONE HCL 1 MG/ML IJ SOLN
0.2500 mg | INTRAMUSCULAR | Status: DC | PRN
  Administered 2020-02-13: 0.5 mg via INTRAVENOUS

## 2020-02-13 MED ORDER — ROCURONIUM BROMIDE 10 MG/ML (PF) SYRINGE
PREFILLED_SYRINGE | INTRAVENOUS | Status: DC | PRN
  Administered 2020-02-13: 50 mg via INTRAVENOUS
  Administered 2020-02-13: 20 mg via INTRAVENOUS
  Administered 2020-02-13: 10 mg via INTRAVENOUS

## 2020-02-13 MED ORDER — PROPOFOL 10 MG/ML IV BOLUS
INTRAVENOUS | Status: DC | PRN
  Administered 2020-02-13: 140 mg via INTRAVENOUS

## 2020-02-13 MED ORDER — ONDANSETRON HCL 4 MG/2ML IJ SOLN: 4.0000 mg | Freq: Once | INTRAMUSCULAR | Status: DC | PRN

## 2020-02-13 MED ORDER — ONDANSETRON HCL 4 MG/2ML IJ SOLN
INTRAMUSCULAR | Status: AC
  Filled 2020-02-13: qty 2

## 2020-02-13 MED ORDER — LIDOCAINE 2% (20 MG/ML) 5 ML SYRINGE
INTRAMUSCULAR | Status: DC | PRN
  Administered 2020-02-13: 100 mg via INTRAVENOUS

## 2020-02-13 MED ORDER — LACTATED RINGERS IV SOLN: Freq: Once | INTRAVENOUS | Status: AC

## 2020-02-13 MED ORDER — DEXMEDETOMIDINE (PRECEDEX) IN NS 20 MCG/5ML (4 MCG/ML) IV SYRINGE
PREFILLED_SYRINGE | INTRAVENOUS | Status: DC | PRN
  Administered 2020-02-13: 4 ug via INTRAVENOUS
  Administered 2020-02-13 (×2): 8 ug via INTRAVENOUS

## 2020-02-13 MED ORDER — INSULIN ASPART 100 UNIT/ML ~~LOC~~ SOLN
0.0000 [IU] | Freq: Four times a day (QID) | SUBCUTANEOUS | Status: DC
  Administered 2020-02-13 – 2020-02-14 (×3): 1 [IU] via SUBCUTANEOUS

## 2020-02-13 MED ORDER — KETOROLAC TROMETHAMINE 15 MG/ML IJ SOLN: 15.0000 mg | INTRAMUSCULAR | Status: DC

## 2020-02-13 MED ORDER — LACTATED RINGERS IR SOLN
Status: DC | PRN
  Administered 2020-02-13: 3000 mL
  Administered 2020-02-13: 1000 mL
  Administered 2020-02-13: 3000 mL

## 2020-02-13 MED ORDER — PHENYLEPHRINE HCL-NACL 10-0.9 MG/250ML-% IV SOLN
INTRAVENOUS | Status: DC | PRN
  Administered 2020-02-13: 20 ug/min via INTRAVENOUS

## 2020-02-13 MED ORDER — LIDOCAINE 2% (20 MG/ML) 5 ML SYRINGE
INTRAMUSCULAR | Status: AC
  Filled 2020-02-13: qty 5

## 2020-02-13 MED ORDER — OXYCODONE HCL 5 MG PO TABS: 5.0000 mg | ORAL_TABLET | Freq: Once | ORAL | Status: DC | PRN

## 2020-02-13 MED ORDER — ALUM & MAG HYDROXIDE-SIMETH 200-200-20 MG/5ML PO SUSP
15.0000 mL | ORAL | Status: DC | PRN
  Administered 2020-02-13 – 2020-02-14 (×3): 15 mL via ORAL
  Filled 2020-02-13 (×3): qty 30

## 2020-02-13 MED ORDER — ONDANSETRON HCL 4 MG/2ML IJ SOLN
INTRAMUSCULAR | Status: DC | PRN
  Administered 2020-02-13: 4 mg via INTRAVENOUS

## 2020-02-13 MED ORDER — EPHEDRINE SULFATE-NACL 50-0.9 MG/10ML-% IV SOSY
PREFILLED_SYRINGE | INTRAVENOUS | Status: DC | PRN
  Administered 2020-02-13 (×2): 5 mg via INTRAVENOUS

## 2020-02-13 MED ORDER — FENTANYL CITRATE (PF) 250 MCG/5ML IJ SOLN
INTRAMUSCULAR | Status: AC
  Filled 2020-02-13: qty 5

## 2020-02-13 MED ORDER — OXYCODONE HCL 5 MG/5ML PO SOLN: 5.0000 mg | Freq: Once | ORAL | Status: DC | PRN

## 2020-02-13 MED ORDER — ROCURONIUM BROMIDE 10 MG/ML (PF) SYRINGE
PREFILLED_SYRINGE | INTRAVENOUS | Status: AC
  Filled 2020-02-13: qty 10

## 2020-02-13 MED ORDER — PIPERACILLIN-TAZOBACTAM 3.375 G IVPB
3.3750 g | Freq: Three times a day (TID) | INTRAVENOUS | Status: DC
  Administered 2020-02-13 – 2020-02-16 (×8): 3.375 g via INTRAVENOUS
  Filled 2020-02-13 (×11): qty 50

## 2020-02-13 MED ORDER — HYDROMORPHONE HCL 1 MG/ML IJ SOLN
0.5000 mg | INTRAMUSCULAR | Status: DC | PRN
  Administered 2020-02-13 – 2020-02-16 (×12): 1 mg via INTRAVENOUS
  Administered 2020-02-16: 0.5 mg via INTRAVENOUS
  Filled 2020-02-13 (×14): qty 1

## 2020-02-13 MED ORDER — MIDAZOLAM HCL 2 MG/2ML IJ SOLN
INTRAMUSCULAR | Status: AC
  Filled 2020-02-13: qty 2

## 2020-02-13 MED ORDER — SCOPOLAMINE 1 MG/3DAYS TD PT72: 1.0000 | MEDICATED_PATCH | TRANSDERMAL | Status: DC

## 2020-02-13 MED ORDER — FENTANYL CITRATE (PF) 100 MCG/2ML IJ SOLN
INTRAMUSCULAR | Status: DC | PRN
  Administered 2020-02-13 (×3): 50 ug via INTRAVENOUS
  Administered 2020-02-13: 100 ug via INTRAVENOUS

## 2020-02-13 MED ORDER — TRAMADOL HCL 50 MG PO TABS
50.0000 mg | ORAL_TABLET | Freq: Four times a day (QID) | ORAL | Status: DC | PRN
  Administered 2020-02-14 – 2020-02-16 (×3): 50 mg via ORAL
  Filled 2020-02-13 (×4): qty 1

## 2020-02-13 MED ORDER — HYDROMORPHONE HCL 1 MG/ML IJ SOLN
INTRAMUSCULAR | Status: AC
  Administered 2020-02-14: 1 mg via INTRAVENOUS
  Filled 2020-02-13: qty 1

## 2020-02-13 MED ORDER — ACETAMINOPHEN 500 MG PO TABS
1000.0000 mg | ORAL_TABLET | Freq: Three times a day (TID) | ORAL | Status: DC
  Filled 2020-02-13 (×4): qty 2

## 2020-02-13 SURGICAL SUPPLY — 48 items
APPLIER CLIP 5 13 M/L LIGAMAX5 (MISCELLANEOUS)
APPLIER CLIP ROT 10 11.4 M/L (STAPLE) ×3
CABLE HIGH FREQUENCY MONO STRZ (ELECTRODE) ×3 IMPLANT
CHLORAPREP W/TINT 26 (MISCELLANEOUS) ×3 IMPLANT
CHOLANGIOGRAM CATH TAUT (CATHETERS) ×3 IMPLANT
CLIP APPLIE 5 13 M/L LIGAMAX5 (MISCELLANEOUS) IMPLANT
CLIP APPLIE ROT 10 11.4 M/L (STAPLE) ×1 IMPLANT
COVER MAYO STAND STRL (DRAPES) ×3 IMPLANT
COVER SURGICAL LIGHT HANDLE (MISCELLANEOUS) ×3 IMPLANT
COVER WAND RF STERILE (DRAPES) IMPLANT
DECANTER SPIKE VIAL GLASS SM (MISCELLANEOUS) ×3 IMPLANT
DERMABOND ADVANCED (GAUZE/BANDAGES/DRESSINGS) ×2
DERMABOND ADVANCED .7 DNX12 (GAUZE/BANDAGES/DRESSINGS) ×1 IMPLANT
DRAIN CHANNEL 19F RND (DRAIN) ×3 IMPLANT
DRAPE C-ARM 42X120 X-RAY (DRAPES) ×3 IMPLANT
DRSG TEGADERM 4X4.75 (GAUZE/BANDAGES/DRESSINGS) ×3 IMPLANT
ELECT REM PT RETURN 15FT ADLT (MISCELLANEOUS) ×3 IMPLANT
ENDOLOOP SUT PDS II  0 18 (SUTURE) ×6
ENDOLOOP SUT PDS II 0 18 (SUTURE) ×3 IMPLANT
EVACUATOR SILICONE 100CC (DRAIN) ×3 IMPLANT
GLOVE SURG SYN 7.5  E (GLOVE) ×2
GLOVE SURG SYN 7.5 E (GLOVE) ×1 IMPLANT
GOWN STRL REUS W/TWL XL LVL3 (GOWN DISPOSABLE) ×9 IMPLANT
HEMOSTAT SURGICEL 4X8 (HEMOSTASIS) ×3 IMPLANT
IV CATH 14GX2 1/4 (CATHETERS) ×3 IMPLANT
IV SET EXTENSION CATH 6 NF (IV SETS) ×3 IMPLANT
KIT BASIN OR (CUSTOM PROCEDURE TRAY) ×3 IMPLANT
KIT TURNOVER KIT A (KITS) ×3 IMPLANT
POUCH RETRIEVAL ECOSAC 10 (ENDOMECHANICALS) ×1 IMPLANT
POUCH RETRIEVAL ECOSAC 10MM (ENDOMECHANICALS) ×2
SCISSORS LAP 5X35 DISP (ENDOMECHANICALS) ×3 IMPLANT
SET IRRIG TUBING LAPAROSCOPIC (IRRIGATION / IRRIGATOR) ×3 IMPLANT
SET TUBE SMOKE EVAC HIGH FLOW (TUBING) ×3 IMPLANT
SLEEVE ADV FIXATION 5X100MM (TROCAR) ×3 IMPLANT
SLEEVE XCEL OPT CAN 5 100 (ENDOMECHANICALS) ×3 IMPLANT
SPONGE GAUZE 2X2 8PLY STER LF (GAUZE/BANDAGES/DRESSINGS) ×1
SPONGE GAUZE 2X2 8PLY STRL LF (GAUZE/BANDAGES/DRESSINGS) ×2 IMPLANT
STOPCOCK 4 WAY LG BORE MALE ST (IV SETS) ×3 IMPLANT
SUT ETHILON 2 0 PS N (SUTURE) ×3 IMPLANT
SUT MNCRL AB 4-0 PS2 18 (SUTURE) ×3 IMPLANT
SYR 10ML ECCENTRIC (SYRINGE) ×3 IMPLANT
TOWEL OR 17X26 10 PK STRL BLUE (TOWEL DISPOSABLE) ×3 IMPLANT
TOWEL OR NON WOVEN STRL DISP B (DISPOSABLE) ×3 IMPLANT
TRAY LAPAROSCOPIC (CUSTOM PROCEDURE TRAY) ×3 IMPLANT
TROCAR ADV FIXATION 11X100MM (TROCAR) ×3 IMPLANT
TROCAR ADV FIXATION 5X100MM (TROCAR) ×3 IMPLANT
TROCAR BLADELESS OPT 5 100 (ENDOMECHANICALS) ×3 IMPLANT
TROCAR XCEL BLUNT TIP 100MML (ENDOMECHANICALS) IMPLANT

## 2020-02-13 NOTE — Progress Notes (Signed)
Progress Note   Subjective  Chief Complaint: Acute cholecystitis with cystic duct stone  Today, the patient tells me she continues with right upper quadrant pain, she is somewhat frustrated as it is taken some time to get answers and she has not been allowed to eat or drink anything today.   Objective   Vital signs in last 24 hours: Temp:  [98.1 F (36.7 C)-99.1 F (37.3 C)] 98.1 F (36.7 C) (11/12 0508) Pulse Rate:  [58-86] 74 (11/12 0808) Resp:  [16-18] 16 (11/12 0508) BP: (132-176)/(80-118) 132/80 (11/12 0808) SpO2:  [93 %-99 %] 99 % (11/12 0508) Weight:  [91.2 kg] 91.2 kg (11/11 1548) Last BM Date: 02/11/20 General: Overweight white female in NAD Heart:  Regular rate and rhythm; no murmurs Lungs: Respirations even and unlabored, lungs CTA bilaterally Abdomen:  Soft, moderate right upper quadrant TTP and nondistended. Normal bowel sounds. Extremities:  Without edema. Neurologic:  Alert and oriented,  grossly normal neurologically. Psych:  Cooperative. Normal mood and affect.  Intake/Output from previous day: 11/11 0701 - 11/12 0700 In: 690 [I.V.:690] Out: -  Intake/Output this shift: Total I/O In: 225 [I.V.:225] Out: -   Lab Results: Recent Labs    02/12/20 0837 02/12/20 1827 02/13/20 0326  WBC 13.2* 11.3* 11.5*  HGB 15.1* 15.1* 14.5  HCT 44.2 45.7 43.8  PLT 205 179 163   BMET Recent Labs    02/12/20 0837 02/12/20 1827 02/13/20 0326  NA 132*  --  135  K 3.8  --  4.3  CL 96*  --  99  CO2 24  --  26  GLUCOSE 128*  --  113*  BUN 7  --  8  CREATININE 0.63 0.74 0.69  CALCIUM 8.6*  --  8.5*   LFT Recent Labs    02/13/20 0326  PROT 6.7  ALBUMIN 3.4*  AST 15  ALT 18  ALKPHOS 56  BILITOT 1.0   Studies/Results: MR 3D Recon At Scanner  Result Date: 02/12/2020 CLINICAL DATA:  Right upper quadrant pain, cholelithiasis, common bile duct dilatation on prior ultrasound EXAM: MRI ABDOMEN WITHOUT AND WITH CONTRAST (INCLUDING MRCP) TECHNIQUE:  Multiplanar multisequence MR imaging of the abdomen was performed both before and after the administration of intravenous contrast. Heavily T2-weighted images of the biliary and pancreatic ducts were obtained, and three-dimensional MRCP images were rendered by post processing. CONTRAST:  52mL GADAVIST GADOBUTROL 1 MMOL/ML IV SOLN COMPARISON:  Same-day right upper quadrant ultrasound FINDINGS: Lower chest: No acute findings. Hepatobiliary: No mass or other parenchymal abnormality identified. Numerous gallstones in the distended gallbladder with mild gallbladder wall thickening and trace pericholecystic fluid. There appears to be a gallstone in the cystic duct measuring 5 mm (series 10, image 26, series 3, image 18). There is no dilatation of the common bile duct and no filling defect to the ampulla. Pancreas: No mass, inflammatory changes, or other parenchymal abnormality identified. Spleen:  Within normal limits in size and appearance. Adrenals/Urinary Tract: No masses identified. No evidence of hydronephrosis. Stomach/Bowel: Visualized portions within the abdomen are unremarkable. Vascular/Lymphatic: No pathologically enlarged lymph nodes identified. No abdominal aortic aneurysm demonstrated. Other:  None. Musculoskeletal: No suspicious bone lesions identified. IMPRESSION: 1. Numerous gallstones in the distended gallbladder with mild gallbladder wall thickening and trace pericholecystic fluid. There appears to be a 5 mm gallstone in the cystic duct. Findings are concerning for acute cholecystitis. 2. There is no dilatation of the common bile duct and no filling defect to the ampulla. These results  will be called to the ordering clinician or representative by the Radiologist Assistant, and communication documented in the PACS or [employer]. Electronically Signed   By: Eddie Candle M.D.   On: 02/12/2020 21:55   MR ABDOMEN MRCP W WO CONTAST  Result Date: 02/12/2020 CLINICAL DATA:  Right upper quadrant pain,  cholelithiasis, common bile duct dilatation on prior ultrasound EXAM: MRI ABDOMEN WITHOUT AND WITH CONTRAST (INCLUDING MRCP) TECHNIQUE: Multiplanar multisequence MR imaging of the abdomen was performed both before and after the administration of intravenous contrast. Heavily T2-weighted images of the biliary and pancreatic ducts were obtained, and three-dimensional MRCP images were rendered by post processing. CONTRAST:  54mL GADAVIST GADOBUTROL 1 MMOL/ML IV SOLN COMPARISON:  Same-day right upper quadrant ultrasound FINDINGS: Lower chest: No acute findings. Hepatobiliary: No mass or other parenchymal abnormality identified. Numerous gallstones in the distended gallbladder with mild gallbladder wall thickening and trace pericholecystic fluid. There appears to be a gallstone in the cystic duct measuring 5 mm (series 10, image 26, series 3, image 18). There is no dilatation of the common bile duct and no filling defect to the ampulla. Pancreas: No mass, inflammatory changes, or other parenchymal abnormality identified. Spleen:  Within normal limits in size and appearance. Adrenals/Urinary Tract: No masses identified. No evidence of hydronephrosis. Stomach/Bowel: Visualized portions within the abdomen are unremarkable. Vascular/Lymphatic: No pathologically enlarged lymph nodes identified. No abdominal aortic aneurysm demonstrated. Other:  None. Musculoskeletal: No suspicious bone lesions identified. IMPRESSION: 1. Numerous gallstones in the distended gallbladder with mild gallbladder wall thickening and trace pericholecystic fluid. There appears to be a 5 mm gallstone in the cystic duct. Findings are concerning for acute cholecystitis. 2. There is no dilatation of the common bile duct and no filling defect to the ampulla. These results will be called to the ordering clinician or representative by the Radiologist Assistant, and communication documented in the PACS or [employer]. Electronically Signed   By: Eddie Candle M.D.   On: 02/12/2020 21:55   US Abdomen Limited RUQ (LIVER/GB)  Result Date: 02/12/2020 CLINICAL DATA:  Right upper quadrant abdominal pain. EXAM: ULTRASOUND ABDOMEN LIMITED RIGHT UPPER QUADRANT COMPARISON:  January 10, 2020. FINDINGS: Gallbladder: Cholelithiasis is noted with largest calculus measuring 6 mm. No significant gallbladder wall thickening or pericholecystic fluid is noted. However, positive sonographic Murphy's sign is noted. Common bile duct: Diameter: 11 mm which is concerning for distal common bile duct obstruction. Liver: No focal lesion identified. Within normal limits in parenchymal echogenicity. Portal vein is patent on color Doppler imaging with normal direction of blood flow towards the liver. Other: None. IMPRESSION: Cholelithiasis is noted without gallbladder wall thickening or pericholecystic fluid, but positive sonographic Murphy's sign is noted. If there is clinical concern for cholecystitis, HIDA scan is recommended for further evaluation. Also noted is dilated common bile duct concerning for distal common bile duct obstruction. Correlation with liver function tests is recommended as well as possible MRCP. Electronically Signed   By: Marijo Conception M.D.   On: 02/12/2020 10:01       Assessment / Plan:   Assessment: 1.  Acute cholecystitis with cystic duct stone: Seen at time of MRCP last night 2.  Right upper quadrant pain: Due to above  Plan: 1.  Discussed case with general surgery this morning.  They are planning to take the patient to the OR today for cholecystectomy and likely interoperative IOC, they will try to remove the cystic duct stone during surgery. 2.  Patient was advised  of above. 3.  Patient to remain n.p.o. until after time of surgery today. 4.  Please await any further recommendations from Dr. Tarri Glenn later today  Thank you for kind consultation, we will continue to follow.     LOS: 1 day   Levin Erp  02/13/2020, 10:56  AM

## 2020-02-13 NOTE — Op Note (Signed)
02/13/2020  5:28 PM  PATIENT:  Amber Mata, 59 y.o., female, MRN: [id number]  PREOP DIAGNOSIS:  CHOLECYSTITIS  POSTOP DIAGNOSIS:   Acute and chronic cholecystitis, cholelthiasis  PROCEDURE:   Procedure(s):  LAPAROSCOPIC CHOLECYSTECTOMY  SURGEON:   Alphonsa Overall, M.D.  ASSISTANTClyda Greener, M.D.  ANESTHESIA:   general  Anesthesiologist: Barnet Glasgow, MD; Oleta Mouse, MD CRNA: Mitzie Na, CRNA; Montel Clock, CRNA  General  ASA: 3 Emergent  EBL:  150  ml  BLOOD ADMINISTERED: none  DRAINS: 19 F Blake drain  LOCAL MEDICATIONS USED:   30 cc of 1/4% marcaine  SPECIMEN:   Gall bladder (in pieces)  COUNTS CORRECT:  YES  INDICATIONS FOR PROCEDURE:  Amber Mata is a 59 y.o. (DOB: 1961-01-30) white female whose primary care physician is Berkley Harvey, NP and comes for cholecystectomy.   She was admitted 02/12/2020 with right upper quadrant pain.  A MRCP today shows a gall bladder full of stones, a possible stone in the cystic duct, but the CBD with no obvious filling defect.   The indications and risks of the gall bladder surgery were explained to the patient.  The risks include, but are not limited to, infection, bleeding, common bile duct injury and open surgery.  SURGERY:  The patient was taken to OR room #4 at [facility].  The abdomen was prepped with chloroprep.  The patient was on Zosyn prior to the beginning of the operation.   A time out was held and the surgical checklist run.   Because of her prior abdominal surgery, I accessed her abdominal cavity with a 5 mm Ethicon Optiview trocar in the LUQ of the abdomen.   I placed 4 additional trocars: a 10 mm trocar in the sub-xiphoid location, a 5 mm trocar in the right mid subcostal area, a 5 mm trocar in the right lateral subcostal area, and a 5 mm trocar just to the right of the umbilicus for the camera.   The abdomen was explored and the liver, stomach, and bowel that could be  seen were unremarkable.  Of note, she had a limited abdominal cavity, probably in part secondary to her prior abdominoplasty.  And she had a large sheet of omentum.   The gall bladder was thick and hard, with evidence of acute and chronic inflammation.  I tried to decompressed the gall bladder, but the entire gall bladder was full of stones and did not decompress.  There was no easy way to grasp the gall bladder.  The area around the gall bladder and cystic duct junction was dense and scarred, so there was no easy way to do the typical gall bladder dissection.  I essentially did a top down dissection of the gall bladder.  In dissecting the gall bladder, part of the liver over the dome of the gall bladder came with the gall bladder.   After dissecting to where the gall bladder was narrowing to the cystic duct, it would have been very difficult to do a cholangiogram.  I had ligated the cystic artery with an endoclip.  So I passed an #1 PDS endoloop over the gall bladder and cinched the endoloop on the distal gall bladder at the junction of the cystic duct.    I then amputated the gall bladder and placed it in an Ecco sac.  So I did no obtain a cholangiogram.     In mobilizing the gall bladder, I spilled numerous stones.  I spend 15 minutes irrigating the gall bladder bed and retrieving the stones.  Hemostasis of the gall bladder bed and liver was with electrocautery.  There was one time I thought I saw bile draining from a duct in the gall bladder bed high up on the liver.  Duct of Luschka?  I cauterized this area and did not see any bile later.  The abdomen and gall bladder bed was irrigated with 5,000 cc saline.   I then removed the gall bladder though the substernal incision.  I did enlarge the fascia to get the gall bladder out.  The gall bladder was removed in pieces.   I placed a piece of Surgicel over the gall bladder bed and cut edge of the liver.  I placed a 15 F Blake drain that I brought out  through right lateral trocar site.  The drain was sewn in place with a 2-0 nylon.   The trocars were then removed.  I infiltrated 30 cc of 1/4% Marcaine into the incisions.  The subxiphoid port fascia closed with interrupted 0 Vicryl suture and the skin closed with 4-0 Monocryl.  The skin was painted with DermaBond.     I have a surgeon as a first assist to retract, expose, and assist on this difficult operation.   The difficulty of the surgery added an extra hour to the operation.   The patient's sponge and needle count were correct.  The patient was transported to the RR in good condition.  Alphonsa Overall, MD, [facility] Surgery Pager: [phone] Office phone:  [phone]

## 2020-02-13 NOTE — Progress Notes (Signed)
Pharmacy Antibiotic Note  Amber Mata is a 59 y.o. female admitted on 02/12/2020 with possible cholecystitis .  Pharmacy has been consulted for Zosyn dosing.  Plan: Zosyn 3.375 g EI q 8 hours  Will sign off and follow remotely for renal function, culture results, and clinical course  Height: 5\' 6"  (167.6 cm) Weight: 91.2 kg (201 lb 1 oz) IBW/kg (Calculated) : 59.3  Temp (24hrs), Avg:98.4 F (36.9 C), Min:98.1 F (36.7 C), Max:99.1 F (37.3 C)  Recent Labs  Lab 02/12/20 0837 02/12/20 1827 02/13/20 0326  WBC 13.2* 11.3* 11.5*  CREATININE 0.63 0.74 0.69    Estimated Creatinine Clearance: 86.2 mL/min (by C-G formula based on SCr of 0.69 mg/dL).    Allergies  Allergen Reactions  . Bee Venom Anaphylaxis  . Hctz [Hydrochlorothiazide] Other (See Comments)    hyponatremia  . Milk-Related Compounds     Lactose Intolerant  . Morphine And Related Other (See Comments)    Patient stated,"I get mean, angry and aggressive behavior."  . Prednisone Other (See Comments)    Aggressive behavior  . Zolpidem Other (See Comments)    Patient state,"they don't help me sleep."  . Oxycodone Itching  . Amlodipine Itching  . Cantaloupe (Diagnostic) Rash    Thank you for allowing pharmacy to be a part of this patient's care.  Ulice Dash D 02/13/2020 9:46 AM

## 2020-02-13 NOTE — Anesthesia Preprocedure Evaluation (Addendum)
Anesthesia Evaluation  Patient identified by MRN, date of birth, ID band Patient awake    Reviewed: Allergy & Precautions, NPO status , Patient's Chart, lab work & pertinent test results  Airway Mallampati: II  TM Distance: >3 FB Neck ROM: Full    Dental no notable dental hx. (+) Teeth Intact, Dental Advisory Given   Pulmonary sleep apnea , Current SmokerPatient did not abstain from smoking.,    Pulmonary exam normal breath sounds clear to auscultation       Cardiovascular hypertension, Pt. on medications and Pt. on home beta blockers Normal cardiovascular exam Rhythm:Regular Rate:Normal     Neuro/Psych negative neurological ROS  negative psych ROS   GI/Hepatic negative GI ROS, Neg liver ROS,   Endo/Other  diabetes, Oral Hypoglycemic Agents  Renal/GU      Musculoskeletal   Abdominal   Peds  Hematology Hgb 14.5   Anesthesia Other Findings   Reproductive/Obstetrics                            Anesthesia Physical Anesthesia Plan  ASA: III  Anesthesia Plan: General   Post-op Pain Management:    Induction: Intravenous  PONV Risk Score and Plan: 3 and Treatment may vary due to age or medical condition, Ondansetron, Dexamethasone and Midazolam  Airway Management Planned: Oral ETT  Additional Equipment:   Intra-op Plan:   Post-operative Plan: Extubation in OR  Informed Consent:     Dental advisory given  Plan Discussed with: CRNA and Anesthesiologist  Anesthesia Plan Comments:        Anesthesia Quick Evaluation

## 2020-02-13 NOTE — Anesthesia Procedure Notes (Signed)
Procedure Name: Intubation Date/Time: 02/13/2020 3:17 PM Performed by: Montel Clock, CRNA Pre-anesthesia Checklist: Patient identified, Emergency Drugs available, Suction available, Patient being monitored and Timeout performed Patient Re-evaluated:Patient Re-evaluated prior to induction Oxygen Delivery Method: Circle system utilized Preoxygenation: Pre-oxygenation with 100% oxygen Induction Type: IV induction Ventilation: Mask ventilation without difficulty and Oral airway inserted - appropriate to patient size Laryngoscope Size: Mac and 3 Grade View: Grade II Tube type: Oral Tube size: 7.0 mm Number of attempts: 1 Airway Equipment and Method: Stylet Placement Confirmation: ETT inserted through vocal cords under direct vision,  positive ETCO2 and breath sounds checked- equal and bilateral Secured at: 21 cm Tube secured with: Tape Dental Injury: Teeth and Oropharynx as per pre-operative assessment

## 2020-02-13 NOTE — Progress Notes (Addendum)
[facility] Consult note  Amber Mata [facility] [birth year]  [id number].    Requesting MD: Barb Merino Chief Complaint: Right upper quadrant pain going to her back x2 days Reason for Consult: Choledocholithiasis  HPI:   Patient is a 59 year old female with a history of type 2 diabetes, gastric bypass, hypertension and anxiety.  She was seen at [facility] on 04/14/2019 with complaints of abdominal pain.  She had recently been treated for Shigella enterocolitis.    On admission she was complaining of loose stools again, which started 2 days prior to evaluation; associated with nausea, but no vomiting.  She complains of right upper quadrant abdominal pain radiating to her back now.  Pain is worse after eating.  She has had some low grade fevers, and myalgia's which are better now.   Work-up in the ED shows she is afebrile blood pressure was mildly elevated.  Sodium is 132, glucose 128, creatinine 0.63 LFTs are all normal.  WBC 13.2, hemoglobin 15.1, hematocrit 44.2, platelets 205,000.  Respiratory panel is negative.  HIV is negative.  Urinalysis is unremarkable.  Abdominal ultrasound shows cholelithiasis with the largest stone measuring 6 mm.  No significant gallbladder wall thickening or pericholecystic fluid she did have a positive Murphy sign.  CBD was 11 mm concerning for a distal common bile duct obstruction.    She was seen in consult by Dr. Thornton Park [facility] gastroenterology.  They recommended MRCP to evaluate for choledocholithiasis.  MRCP shows numerous gallstones in the distended gallbladder with mild gallbladder wall thickening and a trace of pericholecystic fluid there appears to be a 5 mm gallstone in the cystic duct findings are concerning for acute cholecystitis.  There is no dilatation of the common bile duct and no filling defect to the ampulla.  We are asked to see.    ROS: Review of Systems  Constitutional: Positive for fever (low grade fevers just around  100F) and weight loss (weight gain ).  HENT:       Had some generalized myalgia's 2 days ago including jaw pain  Eyes: Negative.   Respiratory: Positive for cough and wheezing.        Still smoking; smokers cough/wheezing vacinated for Covid  Cardiovascular: Positive for leg swelling (occasional ). Negative for chest pain, palpitations, orthopnea, claudication and PND.  Gastrointestinal: Positive for abdominal pain (mostly RUQ), diarrhea, heartburn (occasional) and nausea. Negative for blood in stool, constipation, melena and vomiting.  Genitourinary: Negative.   Musculoskeletal: Positive for myalgias.  Skin: Negative.   Neurological: Negative.   Endo/Heme/Allergies: Negative.   Psychiatric/Behavioral: Negative.     Family History  Problem Relation Age of Onset   Arthritis Other    Diabetes Father    Heart attack Father    Diabetes Paternal Grandfather    Hyperlipidemia Other    Hypertension Other    Stroke Other    Heart disease Other    Heart attack Paternal Grandmother    Stroke Mother    Hypertension Mother    Melanoma Mother     Past Medical History:  Diagnosis Date   Asthma    as a child   Depression    Diabetes mellitus    Glaucoma    Hypertension    Insomnia    Neuropathy    fibromyalgia   Pneumothorax    secondary to pneumonia   Sleep apnea     Past Surgical History:  Procedure Laterality Date   ABDOMINOPLASTY  2007   KNEE SURGERY  left   Mini gastric bypass  2006   TONSILLECTOMY     TUBAL LIGATION      Social History:  reports that she has been smoking cigarettes. She has been smoking about 0.50 packs per day. She has never used smokeless tobacco. She reports current alcohol use of about 5.0 standard drinks of alcohol per week. She reports that she does not use drugs.   Tobacco:  Started smoking again at age 89 ETOH:  Binge drinking 6 pack 1-2 times per week Drugs:  None Works for [employer] now   Allergies:   Allergies  Allergen Reactions   Bee Venom Anaphylaxis   Hctz [Hydrochlorothiazide] Other (See Comments)    hyponatremia   Milk-Related Compounds     Lactose Intolerant   Morphine And Related Other (See Comments)    Patient stated,"I get mean, angry and aggressive behavior."   Prednisone Other (See Comments)    Aggressive behavior   Zolpidem Other (See Comments)    Patient state,"they don't help me sleep."   Oxycodone Itching   Amlodipine Itching   Cantaloupe (Diagnostic) Rash    Medications Prior to Admission  Medication Sig Dispense Refill   acetaminophen (TYLENOL) 500 MG tablet Take 500 mg by mouth every 6 (six) hours as needed for moderate pain.     Calcium Carbonate Antacid (TUMS PO) Take 1 tablet by mouth daily as needed (stomach issue).      clonazePAM (KLONOPIN) 1 MG tablet Take 1 mg by mouth daily as needed for anxiety.      Coenzyme Q10 (CO Q 10 PO) Take 1 tablet by mouth in the morning and at bedtime.      cyanocobalamin (,VITAMIN B-12,) 1000 MCG/ML injection Inject 1,000 mcg into the muscle every 30 (thirty) days.      empagliflozin (JARDIANCE) 10 MG TABS tablet Take 10 mg by mouth daily.      EPINEPHrine (EPIPEN) 0.3 mg/0.3 mL SOAJ injection Inject 0.3 mLs (0.3 mg total) into the muscle once. 2 Device prn   escitalopram (LEXAPRO) 10 MG tablet Take 10 mg by mouth daily.      metFORMIN (GLUCOPHAGE) 500 MG tablet take 1 tablet by mouth once daily (Patient taking differently: Take 500 mg by mouth daily. ) 90 tablet 3   metoprolol tartrate (LOPRESSOR) 25 MG tablet Take 25 mg by mouth 2 (two) times daily.      Multiple Vitamin (MULTIVITAMIN WITH MINERALS) TABS tablet Take 1 tablet by mouth daily.     olmesartan (BENICAR) 20 MG tablet Take 20 mg by mouth daily.      traMADol (ULTRAM) 50 MG tablet Take 50 mg by mouth every 6 (six) hours as needed for moderate pain.     Blood Glucose Monitoring Suppl (FIFTY50 GLUCOSE METER 2.0) w/Device KIT Use as  instructed     folic acid (FOLVITE) 1 MG tablet Take 1 tablet (1 mg total) by mouth daily. (Patient not taking: Reported on 02/12/2020)     glucose blood (ACCU-CHEK ACTIVE STRIPS) test strip 1 strip by Misc.(Non-Drug; Combo Route) route daily.     progesterone (PROMETRIUM) 200 MG capsule TAKE ONE CAPSULE BY MOUTH ONCE DAILY (Patient not taking: Reported on 07/17/2018) 90 capsule 1   traZODone (DESYREL) 50 MG tablet Take 50 mg by mouth 2 (two) times daily.       Blood pressure 132/80, pulse 74, temperature 98.1 F (36.7 C), resp. rate 16, height '5\' 6"'  (1.676 m), weight 91.2 kg, last menstrual period 09/28/2012, SpO2 99 %.  Physical Exam:  General: pleasant, WD, WN white female who is laying in bed in NAD HEENT: head is normocephalic, atraumatic.  Sclera are noninjected.  Pupilsl are equal  Ears and nose without any masses or lesions.  Mouth is pink and moist Heart: regular, rate, and rhythm.  Normal s1,s2. No obvious murmurs, gallops, or rubs noted.  Palpable radial and pedal pulses bilaterally Lungs: CTAB, no wheezes, rhonchi, or rales noted.  Respiratory effort nonlabored Abd: soft, tender RUQ on palpation, ND, +BS, no masses, hernias, or organomegaly. Port scars from gastric bypass. MS: all 4 extremities are symmetrical with no cyanosis, clubbing, or edema. Skin: warm and dry with no masses, lesions, or rashes Neuro: Cranial nerves 2-12 grossly intact, sensation is normal throughout Psych: A&Ox3 with an appropriate affect.   Results for orders placed or performed during the hospital encounter of 02/12/20 (from the past 48 hour(s))  Comprehensive metabolic panel     Status: Abnormal   Collection Time: 02/12/20  8:37 AM  Result Value Ref Range   Sodium 132 (L) 135 - 145 mmol/L   Potassium 3.8 3.5 - 5.1 mmol/L   Chloride 96 (L) 98 - 111 mmol/L   CO2 24 22 - 32 mmol/L   Glucose, Bld 128 (H) 70 - 99 mg/dL    Comment: Glucose reference range applies only to samples taken after fasting for  at least 8 hours.   BUN 7 6 - 20 mg/dL   Creatinine, Ser 0.63 0.44 - 1.00 mg/dL   Calcium 8.6 (L) 8.9 - 10.3 mg/dL   Total Protein 6.5 6.5 - 8.1 g/dL   Albumin 3.5 3.5 - 5.0 g/dL   AST 17 15 - 41 U/L   ALT 18 0 - 44 U/L   Alkaline Phosphatase 53 38 - 126 U/L   Total Bilirubin 0.5 0.3 - 1.2 mg/dL   GFR, Estimated >60 >60 mL/min    Comment: (NOTE) Calculated using the CKD-EPI Creatinine Equation (2021)    Anion gap 12 5 - 15    Comment: Performed at [facility], [address] [place], [place] [address]  Lipase, blood     Status: None   Collection Time: 02/12/20  8:37 AM  Result Value Ref Range   Lipase 25 11 - 51 U/L    Comment: Performed at [facility], [address] [place], [place] [address]  CBC with Differential     Status: Abnormal   Collection Time: 02/12/20  8:37 AM  Result Value Ref Range   WBC 13.2 (H) 4.0 - 10.5 K/uL   RBC 4.73 3.87 - 5.11 MIL/uL   Hemoglobin 15.1 (H) 12.0 - 15.0 g/dL   HCT 44.2 36 - 46 %   MCV 93.4 80.0 - 100.0 fL   MCH 31.9 26.0 - 34.0 pg   MCHC 34.2 30.0 - 36.0 g/dL   RDW 12.1 11.5 - 15.5 %   Platelets 205 150 - 400 K/uL   nRBC 0.0 0.0 - 0.2 %   Neutrophils Relative % 77 %   Neutro Abs 10.0 (H) 1.7 - 7.7 K/uL   Lymphocytes Relative 15 %   Lymphs Abs 2.0 0.7 - 4.0 K/uL   Monocytes Relative 6 %   Monocytes Absolute 0.8 0.1 - 1.0 K/uL   Eosinophils Relative 1 %   Eosinophils Absolute 0.2 0.0 - 0.5 K/uL   Basophils Relative 0 %   Basophils Absolute 0.1 0.0 - 0.1 K/uL   Immature Granulocytes 1 %  Abs Immature Granulocytes 0.09 (H) 0.00 - 0.07 K/uL    Comment: Performed at [facility], [address] [place], [place] [address]  Urinalysis, Routine w reflex microscopic Urine, Clean Catch     Status: Abnormal   Collection Time: 02/12/20  8:37 AM  Result Value Ref Range   Color, Urine YELLOW YELLOW   APPearance CLEAR CLEAR   Specific Gravity, Urine 1.015 1.005 - 1.030   pH 6.0 5.0 - 8.0   Glucose, UA  >=500 (A) NEGATIVE mg/dL   Hgb urine dipstick SMALL (A) NEGATIVE   Bilirubin Urine NEGATIVE NEGATIVE   Ketones, ur NEGATIVE NEGATIVE mg/dL   Protein, ur NEGATIVE NEGATIVE mg/dL   Nitrite NEGATIVE NEGATIVE   Leukocytes,Ua NEGATIVE NEGATIVE    Comment: Performed at [facility], [address] [place], [place] [address]  Urinalysis, Microscopic (reflex)     Status: Abnormal   Collection Time: 02/12/20  8:37 AM  Result Value Ref Range   RBC / HPF 0-5 0 - 5 RBC/hpf   WBC, UA 0-5 0 - 5 WBC/hpf   Bacteria, UA RARE (A) NONE SEEN   Squamous Epithelial / LPF 0-5 0 - 5    Comment: Performed at [facility], [address] [place], [place] [address]  Respiratory Panel by RT PCR (Flu A&B, Covid) - Nasopharyngeal Swab     Status: None   Collection Time: 02/12/20 11:00 AM   Specimen: Nasopharyngeal Swab  Result Value Ref Range   SARS Coronavirus 2 by RT PCR NEGATIVE NEGATIVE    Comment: (NOTE) SARS-CoV-2 target nucleic acids are NOT DETECTED.  The SARS-CoV-2 RNA is generally detectable in upper respiratoy specimens during the acute phase of infection. The lowest concentration of SARS-CoV-2 viral copies this assay can detect is 131 copies/mL. A negative result does not preclude SARS-Cov-2 infection and should not be used as the sole basis for treatment or other patient management decisions. A negative result may occur with  improper specimen collection/handling, submission of specimen other than nasopharyngeal swab, presence of viral mutation(s) within the areas targeted by this assay, and inadequate number of viral copies (<131 copies/mL). A negative result must be combined with clinical observations, patient history, and epidemiological information. The expected result is Negative.  Fact Sheet for Patients:  PinkCheek.be  Fact Sheet for Healthcare Providers:  GravelBags.it  This test is no t yet approved or  cleared by the [place] FDA and  has been authorized for detection and/or diagnosis of SARS-CoV-2 by FDA under an Emergency Use Authorization (EUA). This EUA will remain  in effect (meaning this test can be used) for the duration of the COVID-19 declaration under Section 564(b)(1) of the Act, 21 U.S.C. section 360bbb-3(b)(1), unless the authorization is terminated or revoked sooner.     Influenza A by PCR NEGATIVE NEGATIVE   Influenza B by PCR NEGATIVE NEGATIVE    Comment: (NOTE) The Xpert Xpress SARS-CoV-2/FLU/RSV assay is intended as an aid in  the diagnosis of influenza from Nasopharyngeal swab specimens and  should not be used as a sole basis for treatment. Nasal washings and  aspirates are unacceptable for Xpert Xpress SARS-CoV-2/FLU/RSV  testing.  Fact Sheet for Patients: PinkCheek.be  Fact Sheet for Healthcare Providers: GravelBags.it  This test is not yet approved or cleared by the [place] FDA and  has been authorized for detection and/or diagnosis of SARS-CoV-2 by  FDA under an Emergency Use Authorization (EUA). This EUA will remain  in effect (meaning this test can be used) for the duration of the  Covid-19 declaration under Section 564(b)(1) of the Act, 21  U.S.C. section 360bbb-3(b)(1), unless the authorization is  terminated or revoked. Performed at [facility], [address] [place], [place] [address]   HIV Antibody (routine testing w rflx)     Status: None   Collection Time: 02/12/20  6:27 PM  Result Value Ref Range   HIV Screen 4th Generation wRfx Non Reactive Non Reactive    Comment: Performed at [facility], [place] [address] [place], [place] [address]  CBC     Status: Abnormal   Collection Time: 02/12/20  6:27 PM  Result Value Ref Range   WBC 11.3 (H) 4.0 - 10.5 K/uL   RBC 4.71 3.87 - 5.11 MIL/uL   Hemoglobin 15.1 (H) 12.0 - 15.0 g/dL   HCT 45.7 36 - 46 %   MCV 97.0  80.0 - 100.0 fL   MCH 32.1 26.0 - 34.0 pg   MCHC 33.0 30.0 - 36.0 g/dL   RDW 12.3 11.5 - 15.5 %   Platelets 179 150 - 400 K/uL   nRBC 0.0 0.0 - 0.2 %    Comment: Performed at [facility], [place] [address] [place], [place] [address]  Creatinine, serum     Status: None   Collection Time: 02/12/20  6:27 PM  Result Value Ref Range   Creatinine, Ser 0.74 0.44 - 1.00 mg/dL   GFR, Estimated >60 >60 mL/min    Comment: (NOTE) Calculated using the CKD-EPI Creatinine Equation (2021) Performed at [facility], [place] [address] [place], [place] [address]   Glucose, capillary     Status: Abnormal   Collection Time: 02/12/20  9:59 PM  Result Value Ref Range   Glucose-Capillary 108 (H) 70 - 99 mg/dL    Comment: Glucose reference range applies only to samples taken after fasting for at least 8 hours.  Hemoglobin A1c     Status: Abnormal   Collection Time: 02/13/20  3:26 AM  Result Value Ref Range   Hgb A1c MFr Bld 6.3 (H) 4.8 - 5.6 %    Comment: (NOTE) Pre diabetes:          5.7%-6.4%  Diabetes:              >6.4%  Glycemic control for   <7.0% adults with diabetes    Mean Plasma Glucose 134.11 mg/dL    Comment: Performed at [place] [address], [place] [address]  CBC     Status: Abnormal   Collection Time: 02/13/20  3:26 AM  Result Value Ref Range   WBC 11.5 (H) 4.0 - 10.5 K/uL   RBC 4.49 3.87 - 5.11 MIL/uL   Hemoglobin 14.5 12.0 - 15.0 g/dL   HCT 43.8 36 - 46 %   MCV 97.6 80.0 - 100.0 fL   MCH 32.3 26.0 - 34.0 pg   MCHC 33.1 30.0 - 36.0 g/dL   RDW 12.3 11.5 - 15.5 %   Platelets 163 150 - 400 K/uL   nRBC 0.0 0.0 - 0.2 %    Comment: Performed at [facility], [place] [address] [place], [place] [address]  Comprehensive metabolic panel     Status: Abnormal   Collection Time: 02/13/20  3:26 AM  Result Value Ref Range   Sodium 135 135 - 145 mmol/L   Potassium 4.3 3.5 - 5.1 mmol/L  Chloride 99 98 - 111  mmol/L   CO2 26 22 - 32 mmol/L   Glucose, Bld 113 (H) 70 - 99 mg/dL    Comment: Glucose reference range applies only to samples taken after fasting for at least 8 hours.   BUN 8 6 - 20 mg/dL   Creatinine, Ser 0.69 0.44 - 1.00 mg/dL   Calcium 8.5 (L) 8.9 - 10.3 mg/dL   Total Protein 6.7 6.5 - 8.1 g/dL   Albumin 3.4 (L) 3.5 - 5.0 g/dL   AST 15 15 - 41 U/L   ALT 18 0 - 44 U/L   Alkaline Phosphatase 56 38 - 126 U/L   Total Bilirubin 1.0 0.3 - 1.2 mg/dL   GFR, Estimated >60 >60 mL/min    Comment: (NOTE) Calculated using the CKD-EPI Creatinine Equation (2021)    Anion gap 10 5 - 15    Comment: Performed at [facility], [place] [address], [place], [place] [address]  Glucose, capillary     Status: Abnormal   Collection Time: 02/13/20  7:31 AM  Result Value Ref Range   Glucose-Capillary 105 (H) 70 - 99 mg/dL    Comment: Glucose reference range applies only to samples taken after fasting for at least 8 hours.   MR 3D Recon At Scanner  Result Date: 02/12/2020 CLINICAL DATA:  Right upper quadrant pain, cholelithiasis, common bile duct dilatation on prior ultrasound EXAM: MRI ABDOMEN WITHOUT AND WITH CONTRAST (INCLUDING MRCP) TECHNIQUE: Multiplanar multisequence MR imaging of the abdomen was performed both before and after the administration of intravenous contrast. Heavily T2-weighted images of the biliary and pancreatic ducts were obtained, and three-dimensional MRCP images were rendered by post processing. CONTRAST:  53m GADAVIST GADOBUTROL 1 MMOL/ML IV SOLN COMPARISON:  Same-day right upper quadrant ultrasound FINDINGS: Lower chest: No acute findings. Hepatobiliary: No mass or other parenchymal abnormality identified. Numerous gallstones in the distended gallbladder with mild gallbladder wall thickening and trace pericholecystic fluid. There appears to be a gallstone in the cystic duct measuring 5 mm (series 10, image 26, series 3, image 18). There is no dilatation of the common  bile duct and no filling defect to the ampulla. Pancreas: No mass, inflammatory changes, or other parenchymal abnormality identified. Spleen:  Within normal limits in size and appearance. Adrenals/Urinary Tract: No masses identified. No evidence of hydronephrosis. Stomach/Bowel: Visualized portions within the abdomen are unremarkable. Vascular/Lymphatic: No pathologically enlarged lymph nodes identified. No abdominal aortic aneurysm demonstrated. Other:  None. Musculoskeletal: No suspicious bone lesions identified. IMPRESSION: 1. Numerous gallstones in the distended gallbladder with mild gallbladder wall thickening and trace pericholecystic fluid. There appears to be a 5 mm gallstone in the cystic duct. Findings are concerning for acute cholecystitis. 2. There is no dilatation of the common bile duct and no filling defect to the ampulla. These results will be called to the ordering clinician or representative by the Radiologist Assistant, and communication documented in the PACS or [employer] Electronically Signed   By: AEddie CandleM.D.   On: 02/12/2020 21:55   MR ABDOMEN MRCP W WO CONTAST  Result Date: 02/12/2020 CLINICAL DATA:  Right upper quadrant pain, cholelithiasis, common bile duct dilatation on prior ultrasound EXAM: MRI ABDOMEN WITHOUT AND WITH CONTRAST (INCLUDING MRCP) TECHNIQUE: Multiplanar multisequence MR imaging of the abdomen was performed both before and after the administration of intravenous contrast. Heavily T2-weighted images of the biliary and pancreatic ducts were obtained, and three-dimensional MRCP images were rendered by post processing. CONTRAST:  968mGADAVIST  GADOBUTROL 1 MMOL/ML IV SOLN COMPARISON:  Same-day right upper quadrant ultrasound FINDINGS: Lower chest: No acute findings. Hepatobiliary: No mass or other parenchymal abnormality identified. Numerous gallstones in the distended gallbladder with mild gallbladder wall thickening and trace pericholecystic fluid. There  appears to be a gallstone in the cystic duct measuring 5 mm (series 10, image 26, series 3, image 18). There is no dilatation of the common bile duct and no filling defect to the ampulla. Pancreas: No mass, inflammatory changes, or other parenchymal abnormality identified. Spleen:  Within normal limits in size and appearance. Adrenals/Urinary Tract: No masses identified. No evidence of hydronephrosis. Stomach/Bowel: Visualized portions within the abdomen are unremarkable. Vascular/Lymphatic: No pathologically enlarged lymph nodes identified. No abdominal aortic aneurysm demonstrated. Other:  None. Musculoskeletal: No suspicious bone lesions identified. IMPRESSION: 1. Numerous gallstones in the distended gallbladder with mild gallbladder wall thickening and trace pericholecystic fluid. There appears to be a 5 mm gallstone in the cystic duct. Findings are concerning for acute cholecystitis. 2. There is no dilatation of the common bile duct and no filling defect to the ampulla. These results will be called to the ordering clinician or representative by the Radiologist Assistant, and communication documented in the PACS or [employer]. Electronically Signed   By: Eddie Candle M.D.   On: 02/12/2020 21:55   US Abdomen Limited RUQ (LIVER/GB)  Result Date: 02/12/2020 CLINICAL DATA:  Right upper quadrant abdominal pain. EXAM: ULTRASOUND ABDOMEN LIMITED RIGHT UPPER QUADRANT COMPARISON:  January 10, 2020. FINDINGS: Gallbladder: Cholelithiasis is noted with largest calculus measuring 6 mm. No significant gallbladder wall thickening or pericholecystic fluid is noted. However, positive sonographic Murphy's sign is noted. Common bile duct: Diameter: 11 mm which is concerning for distal common bile duct obstruction. Liver: No focal lesion identified. Within normal limits in parenchymal echogenicity. Portal vein is patent on color Doppler imaging with normal direction of blood flow towards the liver. Other: None.  IMPRESSION: Cholelithiasis is noted without gallbladder wall thickening or pericholecystic fluid, but positive sonographic Murphy's sign is noted. If there is clinical concern for cholecystitis, HIDA scan is recommended for further evaluation. Also noted is dilated common bile duct concerning for distal common bile duct obstruction. Correlation with liver function tests is recommended as well as possible MRCP. Electronically Signed   By: Marijo Conception M.D.   On: 02/12/2020 10:01    lactated ringers 75 mL/hr at 02/12/20 2145   piperacillin-tazobactam (ZOSYN)  IV 3.375 g (02/13/20 1015)     Assessment/Plan Hx gastric bypass Diabetes  HgbA1C - 6.3 - 02/13/2020 Hypertension Depression  Right upper quadrant pain with cholelithiasis/cholecystitis/5 mm stone in the cystic duct.  FEN:  NPO/IV fluids ID:  Zosyn DVT: Lovenox Follow up:  [facility]  Plan:  Plan for laparoscopic cholecystectomy today.  Follow up in our office.   Risk and benefits discussed with the patient and her husband.    Earnstine Regal [facility] 02/13/2020, 10:31 AM Please see Amion for pager number during day hours 7:00am-4:30pm  Agree with above. Husband, Dalinda Heidt, in room.  She had a mini gastric bypass by Dr. Debroah Loop (in [facility] about 2006), tummy tuck about 2010, back surgery around 2008. She works for [employer] from home. She had an "E coli" infection treated about one month ago, but was not hospitalized for this. She claims that she "wakes up" during general anesthesia.  I discussed with the patient the indications and risks of gall bladder surgery.  The primary risks of gall  bladder surgery include, but are not limited to, bleeding, infection, common bile duct injury, and open surgery.  She understands that her previous abdominal surgery increases her risk of surgical complications.  There is also the risk that the patient may have continued symptoms after surgery.  We discussed  the typical post-operative recovery course. I tried to answer the patient's questions.  Plan to proceed with lap chole this afternoon.   Alphonsa Overall, MD, [facility] Surgery Office phone:  [phone]  .

## 2020-02-13 NOTE — Progress Notes (Signed)
PROGRESS NOTE    Amber Mata  DUK:[id number] DOB: 22-Mar-1961 DOA: 02/12/2020 PCP: Berkley Harvey, NP    Brief Narrative:  Patient with history of diabetes, gastric bypass, hypertension, anxiety and recent seizure-like enterocolitis presented to [facility] with abdominal pain of about 2 days duration, nausea without vomiting.  Also associated loose stool.  Recently completed antibiotic therapy for shigella.  In the emergency room patient is afebrile, hypertensive.  On room air.  Right upper quadrant ultrasound with cholelithiasis without gallbladder wall thickening however positive Murphy sign.  Transfer to hospital.  LFTs normal.  MRI showed acute cholecystitis with 5 mm cystic duct stone and patent bile duct.   Assessment & Plan:   Principal Problem:   RUQ pain Active Problems:   Depression   Essential hypertension   Controlled diabetes mellitus type II without complication (HCC)   Calculus of gallbladder and bile duct with obstruction without cholecystitis   Cystic duct calculus  Acute calculus cholecystitis with cystic duct stone: Followed by GI and surgery.  MRI with no biliary duct obstruction. IV fluid, adequate pain medications, serial abdominal exam. Discussed with surgery, plan for lap chole and intraoperative cholangiogram today.  Keep n.p.o. Started on Zosyn, will continue until postop.  Essential hypertension: Blood sugars remained stable on Lopressor and olmesartan that she will continue.  Anxiety and depression: Patient is on benzodiazepine and SSRI.  We will continue.  Type 2 diabetes: Well controlled.  Patient is on Jardiance and Metformin at home.  Hold oral hypoglycemics.  We will keep on sliding scale insulin until she is able to eat.  Discussed case with surgery, consultation called. Discussed case with GI team.   DVT prophylaxis: enoxaparin (LOVENOX) injection 40 mg Start: 02/12/20 2200   Code Status: DNR Family Communication: Husband at  the bedside Disposition Plan: Status is: Inpatient  Remains inpatient appropriate because:Inpatient level of care appropriate due to severity of illness   Dispo: The patient is from: Home              Anticipated d/c is to: Home              Anticipated d/c date is: 2 days              Patient currently is not medically stable to d/c.         Consultants:   Gastroenterology  Surgery  Procedures:   None  Antimicrobials:   Zosyn, 11/12---   Subjective: Patient seen and examined.  Continues to have pain and need to use Dilaudid almost every 2 hours.  Denies any nausea.  She has not eaten for 3 days.  She is kind of frustrated because of abdominal symptoms ongoing for more than 3 weeks. We discussed about radiology findings and cholecystitis.  Discussed about need for surgery. Apparently patient has episodes where she wakes up in between anesthesia and very reluctant about surgery.  Objective: Vitals:   02/12/20 1839 02/12/20 2152 02/13/20 0508 02/13/20 0808  BP:  (!) 147/94 (!) 149/82 132/80  Pulse:  86 71 74  Resp:  16 16   Temp: 98.4 F (36.9 C) 98.4 F (36.9 C) 98.1 F (36.7 C)   TempSrc: Oral Oral    SpO2:  94% 99%   Weight:      Height:        Intake/Output Summary (Last 24 hours) at 02/13/2020 1233 Last data filed at 02/13/2020 1050 Gross per 24 hour  Intake 914.98 ml  Output --  Net 914.98 ml   Filed Weights   02/12/20 0740 02/12/20 1548  Weight: 91.6 kg 91.2 kg    Examination:  General exam: Appears calm and comfortable, anxious. Respiratory system: Clear to auscultation. Respiratory effort normal. Cardiovascular system: S1 & S2 heard, RRR. No JVD, murmurs, rubs, gallops or clicks. No pedal edema. Gastrointestinal system: Soft.  Moderate tenderness right upper quadrant without rigidity.  Rest of the abdominal exam is normal with bowel sounds present.   Central nervous system: Alert and oriented. No focal neurological deficits. Extremities:  Symmetric 5 x 5 power. Skin: No rashes, lesions or ulcers Psychiatry: Judgement and insight appear normal. Mood & affect anxious.    Data Reviewed: I have personally reviewed following labs and imaging studies  CBC: Recent Labs  Lab 02/12/20 0837 02/12/20 1827 02/13/20 0326  WBC 13.2* 11.3* 11.5*  NEUTROABS 10.0*  --   --   HGB 15.1* 15.1* 14.5  HCT 44.2 45.7 43.8  MCV 93.4 97.0 97.6  PLT 205 179 245   Basic Metabolic Panel: Recent Labs  Lab 02/12/20 0837 02/12/20 1827 02/13/20 0326  NA 132*  --  135  K 3.8  --  4.3  CL 96*  --  99  CO2 24  --  26  GLUCOSE 128*  --  113*  BUN 7  --  8  CREATININE 0.63 0.74 0.69  CALCIUM 8.6*  --  8.5*   GFR: Estimated Creatinine Clearance: 86.2 mL/min (by C-G formula based on SCr of 0.69 mg/dL). Liver Function Tests: Recent Labs  Lab 02/12/20 0837 02/13/20 0326  AST 17 15  ALT 18 18  ALKPHOS 53 56  BILITOT 0.5 1.0  PROT 6.5 6.7  ALBUMIN 3.5 3.4*   Recent Labs  Lab 02/12/20 0837  LIPASE 25   No results for input(s): AMMONIA in the last 168 hours. Coagulation Profile: No results for input(s): INR, PROTIME in the last 168 hours. Cardiac Enzymes: No results for input(s): CKTOTAL, CKMB, CKMBINDEX, TROPONINI in the last 168 hours. BNP (last 3 results) No results for input(s): PROBNP in the last 8760 hours. HbA1C: Recent Labs    02/13/20 0326  HGBA1C 6.3*   CBG: Recent Labs  Lab 02/12/20 2159 02/13/20 0731 02/13/20 1122  GLUCAP 108* 105* 96   Lipid Profile: No results for input(s): CHOL, HDL, LDLCALC, TRIG, CHOLHDL, LDLDIRECT in the last 72 hours. Thyroid Function Tests: No results for input(s): TSH, T4TOTAL, FREET4, T3FREE, THYROIDAB in the last 72 hours. Anemia Panel: No results for input(s): VITAMINB12, FOLATE, FERRITIN, TIBC, IRON, RETICCTPCT in the last 72 hours. Sepsis Labs: No results for input(s): PROCALCITON, LATICACIDVEN in the last 168 hours.  Recent Results (from the past 240 hour(s))    Respiratory Panel by RT PCR (Flu A&B, Covid) - Nasopharyngeal Swab     Status: None   Collection Time: 02/12/20 11:00 AM   Specimen: Nasopharyngeal Swab  Result Value Ref Range Status   SARS Coronavirus 2 by RT PCR NEGATIVE NEGATIVE Final    Comment: (NOTE) SARS-CoV-2 target nucleic acids are NOT DETECTED.  The SARS-CoV-2 RNA is generally detectable in upper respiratoy specimens during the acute phase of infection. The lowest concentration of SARS-CoV-2 viral copies this assay can detect is 131 copies/mL. A negative result does not preclude SARS-Cov-2 infection and should not be used as the sole basis for treatment or other patient management decisions. A negative result may occur with  improper specimen collection/handling, submission of specimen other than nasopharyngeal swab, presence of viral mutation(s)  within the areas targeted by this assay, and inadequate number of viral copies (<131 copies/mL). A negative result must be combined with clinical observations, patient history, and epidemiological information. The expected result is Negative.  Fact Sheet for Patients:  PinkCheek.be  Fact Sheet for Healthcare Providers:  GravelBags.it  This test is no t yet approved or cleared by the [place] FDA and  has been authorized for detection and/or diagnosis of SARS-CoV-2 by FDA under an Emergency Use Authorization (EUA). This EUA will remain  in effect (meaning this test can be used) for the duration of the COVID-19 declaration under Section 564(b)(1) of the Act, 21 U.S.C. section 360bbb-3(b)(1), unless the authorization is terminated or revoked sooner.     Influenza A by PCR NEGATIVE NEGATIVE Final   Influenza B by PCR NEGATIVE NEGATIVE Final    Comment: (NOTE) The Xpert Xpress SARS-CoV-2/FLU/RSV assay is intended as an aid in  the diagnosis of influenza from Nasopharyngeal swab specimens and  should not be used as  a sole basis for treatment. Nasal washings and  aspirates are unacceptable for Xpert Xpress SARS-CoV-2/FLU/RSV  testing.  Fact Sheet for Patients: PinkCheek.be  Fact Sheet for Healthcare Providers: GravelBags.it  This test is not yet approved or cleared by the [place] FDA and  has been authorized for detection and/or diagnosis of SARS-CoV-2 by  FDA under an Emergency Use Authorization (EUA). This EUA will remain  in effect (meaning this test can be used) for the duration of the  Covid-19 declaration under Section 564(b)(1) of the Act, 21  U.S.C. section 360bbb-3(b)(1), unless the authorization is  terminated or revoked. Performed at [facility] [address], [place], [place] [address]          Radiology Studies: MR 3D Recon At Scanner  Result Date: 02/12/2020 CLINICAL DATA:  Right upper quadrant pain, cholelithiasis, common bile duct dilatation on prior ultrasound EXAM: MRI ABDOMEN WITHOUT AND WITH CONTRAST (INCLUDING MRCP) TECHNIQUE: Multiplanar multisequence MR imaging of the abdomen was performed both before and after the administration of intravenous contrast. Heavily T2-weighted images of the biliary and pancreatic ducts were obtained, and three-dimensional MRCP images were rendered by post processing. CONTRAST:  48mL GADAVIST GADOBUTROL 1 MMOL/ML IV SOLN COMPARISON:  Same-day right upper quadrant ultrasound FINDINGS: Lower chest: No acute findings. Hepatobiliary: No mass or other parenchymal abnormality identified. Numerous gallstones in the distended gallbladder with mild gallbladder wall thickening and trace pericholecystic fluid. There appears to be a gallstone in the cystic duct measuring 5 mm (series 10, image 26, series 3, image 18). There is no dilatation of the common bile duct and no filling defect to the ampulla. Pancreas: No mass, inflammatory changes, or other parenchymal abnormality  identified. Spleen:  Within normal limits in size and appearance. Adrenals/Urinary Tract: No masses identified. No evidence of hydronephrosis. Stomach/Bowel: Visualized portions within the abdomen are unremarkable. Vascular/Lymphatic: No pathologically enlarged lymph nodes identified. No abdominal aortic aneurysm demonstrated. Other:  None. Musculoskeletal: No suspicious bone lesions identified. IMPRESSION: 1. Numerous gallstones in the distended gallbladder with mild gallbladder wall thickening and trace pericholecystic fluid. There appears to be a 5 mm gallstone in the cystic duct. Findings are concerning for acute cholecystitis. 2. There is no dilatation of the common bile duct and no filling defect to the ampulla. These results will be called to the ordering clinician or representative by the Radiologist Assistant, and communication documented in the PACS or [employer]. Electronically Signed   By: Eddie Candle  M.D.   On: 02/12/2020 21:55   MR ABDOMEN MRCP W WO CONTAST  Result Date: 02/12/2020 CLINICAL DATA:  Right upper quadrant pain, cholelithiasis, common bile duct dilatation on prior ultrasound EXAM: MRI ABDOMEN WITHOUT AND WITH CONTRAST (INCLUDING MRCP) TECHNIQUE: Multiplanar multisequence MR imaging of the abdomen was performed both before and after the administration of intravenous contrast. Heavily T2-weighted images of the biliary and pancreatic ducts were obtained, and three-dimensional MRCP images were rendered by post processing. CONTRAST:  61mL GADAVIST GADOBUTROL 1 MMOL/ML IV SOLN COMPARISON:  Same-day right upper quadrant ultrasound FINDINGS: Lower chest: No acute findings. Hepatobiliary: No mass or other parenchymal abnormality identified. Numerous gallstones in the distended gallbladder with mild gallbladder wall thickening and trace pericholecystic fluid. There appears to be a gallstone in the cystic duct measuring 5 mm (series 10, image 26, series 3, image 18). There is no dilatation  of the common bile duct and no filling defect to the ampulla. Pancreas: No mass, inflammatory changes, or other parenchymal abnormality identified. Spleen:  Within normal limits in size and appearance. Adrenals/Urinary Tract: No masses identified. No evidence of hydronephrosis. Stomach/Bowel: Visualized portions within the abdomen are unremarkable. Vascular/Lymphatic: No pathologically enlarged lymph nodes identified. No abdominal aortic aneurysm demonstrated. Other:  None. Musculoskeletal: No suspicious bone lesions identified. IMPRESSION: 1. Numerous gallstones in the distended gallbladder with mild gallbladder wall thickening and trace pericholecystic fluid. There appears to be a 5 mm gallstone in the cystic duct. Findings are concerning for acute cholecystitis. 2. There is no dilatation of the common bile duct and no filling defect to the ampulla. These results will be called to the ordering clinician or representative by the Radiologist Assistant, and communication documented in the PACS or [employer]. Electronically Signed   By: Eddie Candle M.D.   On: 02/12/2020 21:55   US Abdomen Limited RUQ (LIVER/GB)  Result Date: 02/12/2020 CLINICAL DATA:  Right upper quadrant abdominal pain. EXAM: ULTRASOUND ABDOMEN LIMITED RIGHT UPPER QUADRANT COMPARISON:  January 10, 2020. FINDINGS: Gallbladder: Cholelithiasis is noted with largest calculus measuring 6 mm. No significant gallbladder wall thickening or pericholecystic fluid is noted. However, positive sonographic Murphy's sign is noted. Common bile duct: Diameter: 11 mm which is concerning for distal common bile duct obstruction. Liver: No focal lesion identified. Within normal limits in parenchymal echogenicity. Portal vein is patent on color Doppler imaging with normal direction of blood flow towards the liver. Other: None. IMPRESSION: Cholelithiasis is noted without gallbladder wall thickening or pericholecystic fluid, but positive sonographic Murphy's sign  is noted. If there is clinical concern for cholecystitis, HIDA scan is recommended for further evaluation. Also noted is dilated common bile duct concerning for distal common bile duct obstruction. Correlation with liver function tests is recommended as well as possible MRCP. Electronically Signed   By: Marijo Conception M.D.   On: 02/12/2020 10:01        Scheduled Meds: . enoxaparin (LOVENOX) injection  40 mg Subcutaneous Q24H  . escitalopram  10 mg Oral Daily  . insulin aspart  0-9 Units Subcutaneous Q6H  . irbesartan  150 mg Oral Daily  . metoprolol tartrate  25 mg Oral BID   Continuous Infusions: . lactated ringers 75 mL/hr at 02/12/20 2145  . piperacillin-tazobactam (ZOSYN)  IV 3.375 g (02/13/20 1015)     LOS: 1 day    Time spent: 35 minutes    Barb Merino, MD [facility] Pager [phone]

## 2020-02-13 NOTE — Consult Note (Addendum)
[facility] Consult note   Amber Mata [facility] Oct 30, 1960  [id number].     Requesting MD: Barb Merino Chief Complaint: Right upper quadrant pain going to her back x2 days Reason for Consult: Choledocholithiasis   HPI:  Patient is a 59 year old female with a history of type 2 diabetes, gastric bypass, hypertension and anxiety.  She was seen at [facility] on 04/14/2019 with complaints of abdominal pain.  She had recently been treated for Shigella enterocolitis.   On admission she was complaining of loose stools again, which started 2 days prior to evaluation; associated with nausea, but no vomiting.  She complains of right upper quadrant abdominal pain radiating to her back now.  Pain is worse after eating.  She has had some low grade fevers, and myalgia's which are better now.   Work-up in the ED shows she is afebrile blood pressure was mildly elevated.  Sodium is 132, glucose 128, creatinine 0.63 LFTs are all normal.  WBC 13.2, hemoglobin 15.1, hematocrit 44.2, platelets 205,000.  Respiratory panel is negative.  HIV is negative.  Urinalysis is unremarkable.  Abdominal ultrasound shows cholelithiasis with the largest stone measuring 6 mm.  No significant gallbladder wall thickening or pericholecystic fluid she did have a positive Murphy sign.  CBD was 11 mm concerning for a distal common bile duct obstruction.  She was seen in consult by Dr. Thornton [facility] gastroenterology.  They recommended MRCP to evaluate for choledocholithiasis.  MRCP shows numerous gallstones in the distended gallbladder with mild gallbladder wall thickening and a trace of pericholecystic fluid there appears to be a 5 mm gallstone in the cystic duct findings are concerning for acute cholecystitis.  There is no dilatation of the common bile duct and no filling defect to the ampulla.  We are asked to see.       ROS: Review of Systems  Constitutional: Positive for fever (low grade fevers just  around 100F) and weight loss (weight gain ).  HENT:       Had some generalized myalgia's 2 days ago including jaw pain  Eyes: Negative.   Respiratory: Positive for cough and wheezing.        Still smoking; smokers cough/wheezing vacinated for Covid  Cardiovascular: Positive for leg swelling (occasional ). Negative for chest pain, palpitations, orthopnea, claudication and PND.  Gastrointestinal: Positive for abdominal pain (mostly RUQ), diarrhea, heartburn (occasional) and nausea. Negative for blood in stool, constipation, melena and vomiting.  Genitourinary: Negative.   Musculoskeletal: Positive for myalgias.  Skin: Negative.   Neurological: Negative.   Endo/Heme/Allergies: Negative.   Psychiatric/Behavioral: Negative.            Family History  Problem Relation Age of Onset  . Arthritis Other    . Diabetes Father    . Heart attack Father    . Diabetes Paternal Grandfather    . Hyperlipidemia Other    . Hypertension Other    . Stroke Other    . Heart disease Other    . Heart attack Paternal Grandmother    . Stroke Mother    . Hypertension Mother    . Melanoma Mother            Past Medical History:  Diagnosis Date  . Asthma      as a child  . Depression    . Diabetes mellitus    . Glaucoma    . Hypertension    . Insomnia    . Neuropathy  fibromyalgia  . Pneumothorax      secondary to pneumonia  . Sleep apnea             Past Surgical History:  Procedure Laterality Date  . ABDOMINOPLASTY   2007  . KNEE SURGERY        left  . Mini gastric bypass   2006  . TONSILLECTOMY      . TUBAL LIGATION          Social History:  reports that she has been smoking cigarettes. She has been smoking about 0.50 packs per day. She has never used smokeless tobacco. She reports current alcohol use of about 5.0 standard drinks of alcohol per week. She reports that she does not use drugs.    Tobacco:  Started smoking again at age 74 ETOH:  Binge drinking 6 pack 1-2 times  per week Drugs:  None Works for [employer] now     Allergies:       Allergies  Allergen Reactions  . Bee Venom Anaphylaxis  . Hctz [Hydrochlorothiazide] Other (See Comments)      hyponatremia  . Milk-Related Compounds        Lactose Intolerant  . Morphine And Related Other (See Comments)      Patient stated,"I get mean, angry and aggressive behavior."  . Prednisone Other (See Comments)      Aggressive behavior  . Zolpidem Other (See Comments)      Patient state,"they don't help me sleep."  . Oxycodone Itching  . Amlodipine Itching  . Cantaloupe (Diagnostic) Rash            Medications Prior to Admission  Medication Sig Dispense Refill  . acetaminophen (TYLENOL) 500 MG tablet Take 500 mg by mouth every 6 (six) hours as needed for moderate pain.      . Calcium Carbonate Antacid (TUMS PO) Take 1 tablet by mouth daily as needed (stomach issue).       . clonazePAM (KLONOPIN) 1 MG tablet Take 1 mg by mouth daily as needed for anxiety.       . Coenzyme Q10 (CO Q 10 PO) Take 1 tablet by mouth in the morning and at bedtime.       . cyanocobalamin (,VITAMIN B-12,) 1000 MCG/ML injection Inject 1,000 mcg into the muscle every 30 (thirty) days.       . empagliflozin (JARDIANCE) 10 MG TABS tablet Take 10 mg by mouth daily.       Marland Kitchen EPINEPHrine (EPIPEN) 0.3 mg/0.3 mL SOAJ injection Inject 0.3 mLs (0.3 mg total) into the muscle once. 2 Device prn  . escitalopram (LEXAPRO) 10 MG tablet Take 10 mg by mouth daily.       . metFORMIN (GLUCOPHAGE) 500 MG tablet take 1 tablet by mouth once daily (Patient taking differently: Take 500 mg by mouth daily. ) 90 tablet 3  . metoprolol tartrate (LOPRESSOR) 25 MG tablet Take 25 mg by mouth 2 (two) times daily.       . Multiple Vitamin (MULTIVITAMIN WITH MINERALS) TABS tablet Take 1 tablet by mouth daily.      Marland Kitchen olmesartan (BENICAR) 20 MG tablet Take 20 mg by mouth daily.       . traMADol (ULTRAM) 50 MG tablet Take 50 mg by mouth every 6 (six) hours as needed for  moderate pain.      . Blood Glucose Monitoring Suppl (FIFTY50 GLUCOSE METER 2.0) w/Device KIT Use as instructed      . folic acid (FOLVITE) 1 MG  tablet Take 1 tablet (1 mg total) by mouth daily. (Patient not taking: Reported on 02/12/2020)      . glucose blood (ACCU-CHEK ACTIVE STRIPS) test strip 1 strip by Misc.(Non-Drug; Combo Route) route daily.      . progesterone (PROMETRIUM) 200 MG capsule TAKE ONE CAPSULE BY MOUTH ONCE DAILY (Patient not taking: Reported on 07/17/2018) 90 capsule 1  . traZODone (DESYREL) 50 MG tablet Take 50 mg by mouth 2 (two) times daily.           Blood pressure 132/80, pulse 74, temperature 98.1 F (36.7 C), resp. rate 16, height '5\' 6"'  (1.676 m), weight 91.2 kg, last menstrual period 09/28/2012, SpO2 99 %. Physical Exam:  General: pleasant, WD, WN white female who is laying in bed in NAD HEENT: head is normocephalic, atraumatic.  Sclera are noninjected.  Pupilsl are equal  Ears and nose without any masses or lesions.  Mouth is pink and moist Heart: regular, rate, and rhythm.  Normal s1,s2. No obvious murmurs, gallops, or rubs noted.  Palpable radial and pedal pulses bilaterally Lungs: CTAB, no wheezes, rhonchi, or rales noted.  Respiratory effort nonlabored Abd: soft, tender RUQ on palpation, ND, +BS, no masses, hernias, or organomegaly. Port scars from gastric bypass. MS: all 4 extremities are symmetrical with no cyanosis, clubbing, or edema. Skin: warm and dry with no masses, lesions, or rashes Neuro: Cranial nerves 2-12 grossly intact, sensation is normal throughout Psych: A&Ox3 with an appropriate affect.    Lab Results Last 48 Hours        Results for orders placed or performed during the hospital encounter of 02/12/20 (from the past 48 hour(s))  Comprehensive metabolic panel     Status: Abnormal    Collection Time: 02/12/20  8:37 AM  Result Value Ref Range    Sodium 132 (L) 135 - 145 mmol/L    Potassium 3.8 3.5 - 5.1 mmol/L    Chloride 96 (L) 98 - 111  mmol/L    CO2 24 22 - 32 mmol/L    Glucose, Bld 128 (H) 70 - 99 mg/dL      Comment: Glucose reference range applies only to samples taken after fasting for at least 8 hours.    BUN 7 6 - 20 mg/dL    Creatinine, Ser 0.63 0.44 - 1.00 mg/dL    Calcium 8.6 (L) 8.9 - 10.3 mg/dL    Total Protein 6.5 6.5 - 8.1 g/dL    Albumin 3.5 3.5 - 5.0 g/dL    AST 17 15 - 41 U/L    ALT 18 0 - 44 U/L    Alkaline Phosphatase 53 38 - 126 U/L    Total Bilirubin 0.5 0.3 - 1.2 mg/dL    GFR, Estimated >60 >60 mL/min      Comment: (NOTE) Calculated using the CKD-EPI Creatinine Equation (2021)      Anion gap 12 5 - 15      Comment: Performed at [facility], [address] [place], [place] [address]  Lipase, blood     Status: None    Collection Time: 02/12/20  8:37 AM  Result Value Ref Range    Lipase 25 11 - 51 U/L      Comment: Performed at [facility], [address] [place], [place] [address]  CBC with Differential     Status: Abnormal    Collection Time: 02/12/20  8:37 AM  Result Value Ref Range    WBC 13.2 (H)  4.0 - 10.5 K/uL    RBC 4.73 3.87 - 5.11 MIL/uL    Hemoglobin 15.1 (H) 12.0 - 15.0 g/dL    HCT 44.2 36 - 46 %    MCV 93.4 80.0 - 100.0 fL    MCH 31.9 26.0 - 34.0 pg    MCHC 34.2 30.0 - 36.0 g/dL    RDW 12.1 11.5 - 15.5 %    Platelets 205 150 - 400 K/uL    nRBC 0.0 0.0 - 0.2 %    Neutrophils Relative % 77 %    Neutro Abs 10.0 (H) 1.7 - 7.7 K/uL    Lymphocytes Relative 15 %    Lymphs Abs 2.0 0.7 - 4.0 K/uL    Monocytes Relative 6 %    Monocytes Absolute 0.8 0.1 - 1.0 K/uL    Eosinophils Relative 1 %    Eosinophils Absolute 0.2 0.0 - 0.5 K/uL    Basophils Relative 0 %    Basophils Absolute 0.1 0.0 - 0.1 K/uL    Immature Granulocytes 1 %    Abs Immature Granulocytes 0.09 (H) 0.00 - 0.07 K/uL      Comment: Performed at [facility], [address] [place], [place] [address]  Urinalysis, Routine w reflex microscopic Urine, Clean Catch     Status:  Abnormal    Collection Time: 02/12/20  8:37 AM  Result Value Ref Range    Color, Urine YELLOW YELLOW    APPearance CLEAR CLEAR    Specific Gravity, Urine 1.015 1.005 - 1.030    pH 6.0 5.0 - 8.0    Glucose, UA >=500 (A) NEGATIVE mg/dL    Hgb urine dipstick SMALL (A) NEGATIVE    Bilirubin Urine NEGATIVE NEGATIVE    Ketones, ur NEGATIVE NEGATIVE mg/dL    Protein, ur NEGATIVE NEGATIVE mg/dL    Nitrite NEGATIVE NEGATIVE    Leukocytes,Ua NEGATIVE NEGATIVE      Comment: Performed at [facility], [address] [place], [place] [address]  Urinalysis, Microscopic (reflex)     Status: Abnormal    Collection Time: 02/12/20  8:37 AM  Result Value Ref Range    RBC / HPF 0-5 0 - 5 RBC/hpf    WBC, UA 0-5 0 - 5 WBC/hpf    Bacteria, UA RARE (A) NONE SEEN    Squamous Epithelial / LPF 0-5 0 - 5      Comment: Performed at [facility], [address] [place], [place] [address]  Respiratory Panel by RT PCR (Flu A&B, Covid) - Nasopharyngeal Swab     Status: None    Collection Time: 02/12/20 11:00 AM    Specimen: Nasopharyngeal Swab  Result Value Ref Range    SARS Coronavirus 2 by RT PCR NEGATIVE NEGATIVE      Comment: (NOTE) SARS-CoV-2 target nucleic acids are NOT DETECTED.   The SARS-CoV-2 RNA is generally detectable in upper respiratoy specimens during the acute phase of infection. The lowest concentration of SARS-CoV-2 viral copies this assay can detect is 131 copies/mL. A negative result does not preclude SARS-Cov-2 infection and should not be used as the sole basis for treatment or other patient management decisions. A negative result may occur with  improper specimen collection/handling, submission of specimen other than nasopharyngeal swab, presence of viral mutation(s) within the areas targeted by this assay, and inadequate number of viral copies (<131 copies/mL). A negative result must be combined with clinical observations, patient history, and epidemiological  information.  The expected result is Negative.   Fact Sheet for Patients:  PinkCheek.be   Fact Sheet for Healthcare Providers:  GravelBags.it   This test is no t yet approved or cleared by the [place] FDA and  has been authorized for detection and/or diagnosis of SARS-CoV-2 by FDA under an Emergency Use Authorization (EUA). This EUA will remain  in effect (meaning this test can be used) for the duration of the COVID-19 declaration under Section 564(b)(1) of the Act, 21 U.S.C. section 360bbb-3(b)(1), unless the authorization is terminated or revoked sooner.        Influenza A by PCR NEGATIVE NEGATIVE    Influenza B by PCR NEGATIVE NEGATIVE      Comment: (NOTE) The Xpert Xpress SARS-CoV-2/FLU/RSV assay is intended as an aid in  the diagnosis of influenza from Nasopharyngeal swab specimens and  should not be used as a sole basis for treatment. Nasal washings and  aspirates are unacceptable for Xpert Xpress SARS-CoV-2/FLU/RSV  testing.   Fact Sheet for Patients: PinkCheek.be   Fact Sheet for Healthcare Providers: GravelBags.it   This test is not yet approved or cleared by the [place] FDA and  has been authorized for detection and/or diagnosis of SARS-CoV-2 by  FDA under an Emergency Use Authorization (EUA). This EUA will remain  in effect (meaning this test can be used) for the duration of the  Covid-19 declaration under Section 564(b)(1) of the Act, 21  U.S.C. section 360bbb-3(b)(1), unless the authorization is  terminated or revoked. Performed at [facility], [address] [place], [place] [address]    HIV Antibody (routine testing w rflx)     Status: None    Collection Time: 02/12/20  6:27 PM  Result Value Ref Range    HIV Screen 4th Generation wRfx Non Reactive Non Reactive      Comment: Performed at [facility], [place]  [address], [place], [place] [address]  CBC     Status: Abnormal    Collection Time: 02/12/20  6:27 PM  Result Value Ref Range    WBC 11.3 (H) 4.0 - 10.5 K/uL    RBC 4.71 3.87 - 5.11 MIL/uL    Hemoglobin 15.1 (H) 12.0 - 15.0 g/dL    HCT 45.7 36 - 46 %    MCV 97.0 80.0 - 100.0 fL    MCH 32.1 26.0 - 34.0 pg    MCHC 33.0 30.0 - 36.0 g/dL    RDW 12.3 11.5 - 15.5 %    Platelets 179 150 - 400 K/uL    nRBC 0.0 0.0 - 0.2 %      Comment: Performed at [facility], [place] [address], [place], [place] [address]  Creatinine, serum     Status: None    Collection Time: 02/12/20  6:27 PM  Result Value Ref Range    Creatinine, Ser 0.74 0.44 - 1.00 mg/dL    GFR, Estimated >60 >60 mL/min      Comment: (NOTE) Calculated using the CKD-EPI Creatinine Equation (2021) Performed at [facility], [place] [address], [place], [place] [address]    Glucose, capillary     Status: Abnormal    Collection Time: 02/12/20  9:59 PM  Result Value Ref Range    Glucose-Capillary 108 (H) 70 - 99 mg/dL      Comment: Glucose reference range applies only to samples taken after fasting for at least 8 hours.  Hemoglobin A1c     Status: Abnormal  Collection Time: 02/13/20  3:26 AM  Result Value Ref Range    Hgb A1c MFr Bld 6.3 (H) 4.8 - 5.6 %      Comment: (NOTE) Pre diabetes:          5.7%-6.4%   Diabetes:              >6.4%   Glycemic control for   <7.0% adults with diabetes      Mean Plasma Glucose 134.11 mg/dL      Comment: Performed at [facility], [place] [address], [place] [address]  CBC     Status: Abnormal    Collection Time: 02/13/20  3:26 AM  Result Value Ref Range    WBC 11.5 (H) 4.0 - 10.5 K/uL    RBC 4.49 3.87 - 5.11 MIL/uL    Hemoglobin 14.5 12.0 - 15.0 g/dL    HCT 43.8 36 - 46 %    MCV 97.6 80.0 - 100.0 fL    MCH 32.3 26.0 - 34.0 pg    MCHC 33.1 30.0 - 36.0 g/dL    RDW 12.3 11.5 - 15.5 %    Platelets 163 150 - 400 K/uL    nRBC 0.0 0.0 - 0.2 %       Comment: Performed at [facility], [place] [address] [place], [place] [address]  Comprehensive metabolic panel     Status: Abnormal    Collection Time: 02/13/20  3:26 AM  Result Value Ref Range    Sodium 135 135 - 145 mmol/L    Potassium 4.3 3.5 - 5.1 mmol/L    Chloride 99 98 - 111 mmol/L    CO2 26 22 - 32 mmol/L    Glucose, Bld 113 (H) 70 - 99 mg/dL      Comment: Glucose reference range applies only to samples taken after fasting for at least 8 hours.    BUN 8 6 - 20 mg/dL    Creatinine, Ser 0.69 0.44 - 1.00 mg/dL    Calcium 8.5 (L) 8.9 - 10.3 mg/dL    Total Protein 6.7 6.5 - 8.1 g/dL    Albumin 3.4 (L) 3.5 - 5.0 g/dL    AST 15 15 - 41 U/L    ALT 18 0 - 44 U/L    Alkaline Phosphatase 56 38 - 126 U/L    Total Bilirubin 1.0 0.3 - 1.2 mg/dL    GFR, Estimated >60 >60 mL/min      Comment: (NOTE) Calculated using the CKD-EPI Creatinine Equation (2021)      Anion gap 10 5 - 15      Comment: Performed at [facility], [place] [address] [place], [place] [address]  Glucose, capillary     Status: Abnormal    Collection Time: 02/13/20  7:31 AM  Result Value Ref Range    Glucose-Capillary 105 (H) 70 - 99 mg/dL      Comment: Glucose reference range applies only to samples taken after fasting for at least 8 hours.       Imaging Results (Last 48 hours)  MR 3D Recon At Scanner   Result Date: 02/12/2020 CLINICAL DATA:  Right upper quadrant pain, cholelithiasis, common bile duct dilatation on prior ultrasound EXAM: MRI ABDOMEN WITHOUT AND WITH CONTRAST (INCLUDING MRCP) TECHNIQUE: Multiplanar multisequence MR imaging of the abdomen was performed both before and after the administration of intravenous contrast. Heavily T2-weighted images of the biliary and pancreatic ducts were obtained, and three-dimensional MRCP images were rendered  by post processing. CONTRAST:  87m GADAVIST GADOBUTROL 1 MMOL/ML IV SOLN COMPARISON:  Same-day right upper quadrant ultrasound  FINDINGS: Lower chest: No acute findings. Hepatobiliary: No mass or other parenchymal abnormality identified. Numerous gallstones in the distended gallbladder with mild gallbladder wall thickening and trace pericholecystic fluid. There appears to be a gallstone in the cystic duct measuring 5 mm (series 10, image 26, series 3, image 18). There is no dilatation of the common bile duct and no filling defect to the ampulla. Pancreas: No mass, inflammatory changes, or other parenchymal abnormality identified. Spleen:  Within normal limits in size and appearance. Adrenals/Urinary Tract: No masses identified. No evidence of hydronephrosis. Stomach/Bowel: Visualized portions within the abdomen are unremarkable. Vascular/Lymphatic: No pathologically enlarged lymph nodes identified. No abdominal aortic aneurysm demonstrated. Other:  None. Musculoskeletal: No suspicious bone lesions identified. IMPRESSION: 1. Numerous gallstones in the distended gallbladder with mild gallbladder wall thickening and trace pericholecystic fluid. There appears to be a 5 mm gallstone in the cystic duct. Findings are concerning for acute cholecystitis. 2. There is no dilatation of the common bile duct and no filling defect to the ampulla. These results will be called to the ordering clinician or representative by the Radiologist Assistant, and communication documented in the PACS or [employer] Electronically Signed   By: AEddie CandleM.D.   On: 02/12/2020 21:55    MR ABDOMEN MRCP W WO CONTAST   Result Date: 02/12/2020 CLINICAL DATA:  Right upper quadrant pain, cholelithiasis, common bile duct dilatation on prior ultrasound EXAM: MRI ABDOMEN WITHOUT AND WITH CONTRAST (INCLUDING MRCP) TECHNIQUE: Multiplanar multisequence MR imaging of the abdomen was performed both before and after the administration of intravenous contrast. Heavily T2-weighted images of the biliary and pancreatic ducts were obtained, and three-dimensional MRCP images  were rendered by post processing. CONTRAST:  910mGADAVIST GADOBUTROL 1 MMOL/ML IV SOLN COMPARISON:  Same-day right upper quadrant ultrasound FINDINGS: Lower chest: No acute findings. Hepatobiliary: No mass or other parenchymal abnormality identified. Numerous gallstones in the distended gallbladder with mild gallbladder wall thickening and trace pericholecystic fluid. There appears to be a gallstone in the cystic duct measuring 5 mm (series 10, image 26, series 3, image 18). There is no dilatation of the common bile duct and no filling defect to the ampulla. Pancreas: No mass, inflammatory changes, or other parenchymal abnormality identified. Spleen:  Within normal limits in size and appearance. Adrenals/Urinary Tract: No masses identified. No evidence of hydronephrosis. Stomach/Bowel: Visualized portions within the abdomen are unremarkable. Vascular/Lymphatic: No pathologically enlarged lymph nodes identified. No abdominal aortic aneurysm demonstrated. Other:  None. Musculoskeletal: No suspicious bone lesions identified. IMPRESSION: 1. Numerous gallstones in the distended gallbladder with mild gallbladder wall thickening and trace pericholecystic fluid. There appears to be a 5 mm gallstone in the cystic duct. Findings are concerning for acute cholecystitis. 2. There is no dilatation of the common bile duct and no filling defect to the ampulla. These results will be called to the ordering clinician or representative by the Radiologist Assistant, and communication documented in the PACS or [employer]ically Signed   By: AlEddie Candle.D.   On: 02/12/2020 21:55    USKoreabdomen Limited RUQ (LIVER/GB)   Result Date: 02/12/2020 CLINICAL DATA:  Right upper quadrant abdominal pain. EXAM: ULTRASOUND ABDOMEN LIMITED RIGHT UPPER QUADRANT COMPARISON:  January 10, 2020. FINDINGS: Gallbladder: Cholelithiasis is noted with largest calculus measuring 6 mm. No significant gallbladder wall thickening or  pericholecystic fluid is noted. However, positive sonographic Murphy's sign  is noted. Common bile duct: Diameter: 11 mm which is concerning for distal common bile duct obstruction. Liver: No focal lesion identified. Within normal limits in parenchymal echogenicity. Portal vein is patent on color Doppler imaging with normal direction of blood flow towards the liver. Other: None. IMPRESSION: Cholelithiasis is noted without gallbladder wall thickening or pericholecystic fluid, but positive sonographic Murphy's sign is noted. If there is clinical concern for cholecystitis, HIDA scan is recommended for further evaluation. Also noted is dilated common bile duct concerning for distal common bile duct obstruction. Correlation with liver function tests is recommended as well as possible MRCP. Electronically Signed   By: Marijo Conception M.D.   On: 02/12/2020 10:01     . lactated ringers 75 mL/hr at 02/12/20 2145  . piperacillin-tazobactam (ZOSYN)  IV 3.375 g (02/13/20 1015)        Assessment/Plan Hx gastric bypass Diabetes Hypertension Depression   Right upper quadrant pain with cholelithiasis/cholecystitis/5 mm stone in the cystic duct.   FEN:  NPO/IV fluids ID:  Zosyn DVT: Lovenox Follow up:  [facility]   Plan:  Plan for laparoscopic cholecystectomy today.  Follow up in our office.  Risk and benefits discussed with the patient and her husband.         Earnstine [facility] Surgery 02/13/2020, 10:31 AM Please see Amion for pager number during day hours 7:00am-4:30pm .             There are two notes/consultations today.  I have signed and edited the note timed at 10:31.  Alphonsa Overall, MD, [facility] Surgery Office phone:  [phone]

## 2020-02-13 NOTE — Transfer of Care (Signed)
Immediate Anesthesia Transfer of Care Note  Patient: Amber Mata  Procedure(s) Performed: LAPAROSCOPIC CHOLECYSTECTOMY (N/A Abdomen)  Patient Location: PACU  Anesthesia Type:General  Level of Consciousness: awake, alert , oriented and patient cooperative  Airway & Oxygen Therapy: Patient Spontanous Breathing and Patient connected to face mask oxygen  Post-op Assessment: Report given to RN, Post -op Vital signs reviewed and stable and Patient moving all extremities  Post vital signs: Reviewed and stable  Last Vitals:  Vitals Value Taken Time  BP    Temp    Pulse 98 02/13/20 1741  Resp 15 02/13/20 1741  SpO2 99 % 02/13/20 1741  Vitals shown include unvalidated device data.  Last Pain:  Vitals:   02/13/20 1417  TempSrc: Oral  PainSc:       Patients Stated Pain Goal: 3 (87/56/43 3295)  Complications: No complications documented.

## 2020-02-13 NOTE — Discharge Instructions (Signed)
[facility] ______[facility] °LAPAROSCOPIC SURGERY: POST OP INSTRUCTIONS °Always review your discharge instruction sheet given to you by the facility where your surgery was performed. °IF YOU HAVE DISABILITY OR FAMILY LEAVE FORMS, YOU MUST BRING THEM TO THE OFFICE FOR PROCESSING.   °DO NOT GIVE THEM TO YOUR DOCTOR. ° °1. A prescription for pain medication may be given to you upon discharge.  Take your pain medication as prescribed, if needed.  If narcotic pain medicine is not needed, then you may take acetaminophen (Tylenol) or ibuprofen (Advil) as needed. °2. Take your usually prescribed medications unless otherwise directed. °3. If you need a refill on your pain medication, please contact your pharmacy.  They will contact our office to request authorization. Prescriptions will not be filled after 5pm or on week-ends. °4. You should follow a light diet the first few days after arrival home, such as soup and crackers, etc.  Be sure to include lots of fluids daily. °5. Most patients will experience some swelling and bruising in the area of the incisions.  Ice packs will help.  Swelling and bruising can take several days to resolve.  °6. It is common to experience some constipation if taking pain medication after surgery.  Increasing fluid intake and taking a stool softener (such as Colace) will usually help or prevent this problem from occurring.  A mild laxative (Milk of Magnesia or Miralax) should be taken according to package instructions if there are no bowel movements after 48 hours. °7. Unless discharge instructions indicate otherwise, you may remove your bandages 24-48 hours after surgery, and you may shower at that time.  You may have steri-strips (small skin tapes) in place directly over the incision.  These strips should be left on the skin for 7-10 days.  If your surgeon used skin glue on the incision, you may shower in 24 hours.  The glue will flake off over the next 2-3 weeks.  Any sutures or  staples will be removed at the office during your follow-up visit. °8. ACTIVITIES:  You may resume regular (light) daily activities beginning the next day--such as daily self-care, walking, climbing stairs--gradually increasing activities as tolerated.  You may have sexual intercourse when it is comfortable.  Refrain from any heavy lifting or straining until approved by your doctor. °a. You may drive when you are no longer taking prescription pain medication, you can comfortably wear a seatbelt, and you can safely maneuver your car and apply brakes. °b. RETURN TO WORK:  __________________________________________________________ °9. You should see your doctor in the office for a follow-up appointment approximately 2-3 weeks after your surgery.  Make sure that you call for this appointment within a day or two after you arrive home to insure a convenient appointment time. °10. OTHER INSTRUCTIONS: __________________________________________________________________________________________________________________________ __________________________________________________________________________________________________________________________ °WHEN TO CALL YOUR DOCTOR: °1. Fever over 101.0 °2. Inability to urinate °3. Continued bleeding from incision. °4. Increased pain, redness, or drainage from the incision. °5. Increasing abdominal pain ° °The clinic staff is available to answer your questions during regular business hours.  Please don’t hesitate to call and ask to speak to one of the nurses for clinical concerns.  If you have a medical emergency, go to the nearest emergency room or call 911.  A surgeon from [facility] is always on call at the hospital. °[address], [address], [place], [place]  [address] ? [address] [address], [place], [place]   [address] °[phone] ? [phone] ? FAX [phone] °Web site:   [email or web address] °

## 2020-02-13 NOTE — Progress Notes (Signed)
Pt requesting med for indigestion. Barb Merino, MD paged.

## 2020-02-14 DIAGNOSIS — K8071 Calculus of gallbladder and bile duct without cholecystitis with obstruction: Secondary | ICD-10-CM | POA: Diagnosis not present

## 2020-02-14 DIAGNOSIS — K8020 Calculus of gallbladder without cholecystitis without obstruction: Secondary | ICD-10-CM | POA: Diagnosis not present

## 2020-02-14 DIAGNOSIS — R1011 Right upper quadrant pain: Secondary | ICD-10-CM | POA: Diagnosis not present

## 2020-02-14 DIAGNOSIS — R748 Abnormal levels of other serum enzymes: Secondary | ICD-10-CM

## 2020-02-14 LAB — CBC
HCT: 39.8 % (ref 36.0–46.0)
Hemoglobin: 13 g/dL (ref 12.0–15.0)
MCH: 32 pg (ref 26.0–34.0)
MCHC: 32.7 g/dL (ref 30.0–36.0)
MCV: 98 fL (ref 80.0–100.0)
Platelets: 162 10*3/uL (ref 150–400)
RBC: 4.06 MIL/uL (ref 3.87–5.11)
RDW: 12.2 % (ref 11.5–15.5)
WBC: 13.6 10*3/uL — ABNORMAL HIGH (ref 4.0–10.5)
nRBC: 0 % (ref 0.0–0.2)

## 2020-02-14 LAB — COMPREHENSIVE METABOLIC PANEL
ALT: 53 U/L — ABNORMAL HIGH (ref 0–44)
AST: 90 U/L — ABNORMAL HIGH (ref 15–41)
Albumin: 3.3 g/dL — ABNORMAL LOW (ref 3.5–5.0)
Alkaline Phosphatase: 52 U/L (ref 38–126)
Anion gap: 9 (ref 5–15)
BUN: 13 mg/dL (ref 6–20)
CO2: 26 mmol/L (ref 22–32)
Calcium: 8.2 mg/dL — ABNORMAL LOW (ref 8.9–10.3)
Chloride: 99 mmol/L (ref 98–111)
Creatinine, Ser: 0.7 mg/dL (ref 0.44–1.00)
GFR, Estimated: >60 mL/min (ref >60)
Glucose, Bld: 129 mg/dL — ABNORMAL HIGH (ref 70–99)
Potassium: 5.1 mmol/L (ref 3.5–5.1)
Sodium: 134 mmol/L — ABNORMAL LOW (ref 135–145)
Total Bilirubin: 1.1 mg/dL (ref 0.3–1.2)
Total Protein: 6.3 g/dL — ABNORMAL LOW (ref 6.5–8.1)

## 2020-02-14 LAB — GLUCOSE, CAPILLARY
Glucose-Capillary: 144 mg/dL — ABNORMAL HIGH (ref 70–99)
Glucose-Capillary: 114 mg/dL — ABNORMAL HIGH (ref 70–99)
Glucose-Capillary: 130 mg/dL — ABNORMAL HIGH (ref 70–99)
Glucose-Capillary: 121 mg/dL — ABNORMAL HIGH (ref 70–99)
Glucose-Capillary: 140 mg/dL — ABNORMAL HIGH (ref 70–99)

## 2020-02-14 MED ORDER — INSULIN ASPART 100 UNIT/ML ~~LOC~~ SOLN: 0.0000 [IU] | Freq: Every day | SUBCUTANEOUS | Status: DC

## 2020-02-14 MED ORDER — INSULIN ASPART 100 UNIT/ML ~~LOC~~ SOLN
0.0000 [IU] | Freq: Three times a day (TID) | SUBCUTANEOUS | Status: DC
  Administered 2020-02-15 (×2): 1 [IU] via SUBCUTANEOUS
  Administered 2020-02-15: 2 [IU] via SUBCUTANEOUS
  Administered 2020-02-16: 1 [IU] via SUBCUTANEOUS

## 2020-02-14 NOTE — Progress Notes (Signed)
PROGRESS NOTE    Amber Mata  OEU:[id number] DOB: May 19, 1960 DOA: 02/12/2020 PCP: Berkley Harvey, NP    Brief Narrative:  Patient with history of diabetes, gastric bypass, hypertension, anxiety and recent seizure-like enterocolitis presented to [facility] with abdominal pain of about 2 days duration, nausea without vomiting.  Also associated loose stool.  Recently completed antibiotic therapy for shigella.  In the emergency room patient is afebrile, hypertensive.  On room air.  Right upper quadrant ultrasound with cholelithiasis without gallbladder wall thickening however positive Murphy sign.  Transfer to hospital.  LFTs normal.  MRI showed acute cholecystitis with 5 mm cystic duct stone and patent bile duct.   Assessment & Plan:   Principal Problem:   RUQ pain Active Problems:   Depression   Essential hypertension   Controlled diabetes mellitus type II without complication (HCC)   Calculus of gallbladder and bile duct with obstruction without cholecystitis   Cystic duct calculus  Acute calculus cholecystitis with cystic duct stone: MRCP with no biliary duct obstruction.   Status post lap chole, day 1 postop today.   Continue Zosyn.  Will change to oral antibiotics tomorrow.   Postop management as per surgery. Encouraging oral pain medication intake.  Essential hypertension: Blood sugars remained stable on Lopressor and olmesartan that she will continue.  Anxiety and depression: Patient is on benzodiazepine and SSRI.  We will continue.  Type 2 diabetes: Well controlled.  Patient is on Jardiance and Metformin at home.  Hold oral hypoglycemics.  We will keep on sliding scale insulin until she is able to eat.  DVT prophylaxis: enoxaparin (LOVENOX) injection 40 mg Start: 02/14/20 1800   Code Status: DNR Family Communication: Husband at the bedside Disposition Plan: Status is: Inpatient  Remains inpatient appropriate because:Inpatient level of care appropriate  due to severity of illness   Dispo: The patient is from: Home              Anticipated d/c is to: Home              Anticipated d/c date is tomorrow.              Patient currently is not medically stable to d/c.         Consultants:   Gastroenterology  Surgery  Procedures:   None  Antimicrobials:   Zosyn, 11/12---   Subjective: Patient seen and examined.  She was pretty disappointed that she still has pain after surgery. I discussed with her about surgical process, in her incisions and tractions on the tissue that will give her pain. Using Dilaudid frequently and that is the only medicine helping her.  Objective: Vitals:   02/14/20 0153 02/14/20 0603 02/14/20 1226 02/14/20 1414  BP: 125/77 112/64 100/62 (!) 93/52  Pulse: 82 84 78 79  Resp: 16 16  18   Temp: 97.8 F (36.6 C) 98.1 F (36.7 C) 97.7 F (36.5 C) 98.2 F (36.8 C)  TempSrc: Oral Oral Oral Oral  SpO2: 93% 91%  97%  Weight:      Height:        Intake/Output Summary (Last 24 hours) at 02/14/2020 1421 Last data filed at 02/14/2020 0603 Gross per 24 hour  Intake 2723.02 ml  Output 988 ml  Net 1735.02 ml   Filed Weights   02/12/20 0740 02/12/20 1548 02/13/20 1400  Weight: 91.6 kg 91.2 kg 91.2 kg    Examination:  General exam: Appears calm and comfortable but anxious. Respiratory system: Clear  to auscultation. Respiratory effort normal. Cardiovascular system: S1 & S2 heard, RRR. No JVD, murmurs, rubs, gallops or clicks. No pedal edema. Gastrointestinal system: Soft.  Tender as appropriate from postop.  Right upper quadrant biliary drain with minimal serosanguineous/biliary secretion. Central nervous system: Alert and oriented. No focal neurological deficits. Extremities: Symmetric 5 x 5 power. Skin: No rashes, lesions or ulcers Psychiatry: Judgement and insight appear normal. Mood & affect anxious.    Data Reviewed: I have personally reviewed following labs and imaging  studies  CBC: Recent Labs  Lab 02/12/20 0837 02/12/20 1827 02/13/20 0326 02/14/20 0345  WBC 13.2* 11.3* 11.5* 13.6*  NEUTROABS 10.0*  --   --   --   HGB 15.1* 15.1* 14.5 13.0  HCT 44.2 45.7 43.8 39.8  MCV 93.4 97.0 97.6 98.0  PLT 205 179 163 182   Basic Metabolic Panel: Recent Labs  Lab 02/12/20 0837 02/12/20 1827 02/13/20 0326 02/14/20 0345  NA 132*  --  135 134*  K 3.8  --  4.3 5.1  CL 96*  --  99 99  CO2 24  --  26 26  GLUCOSE 128*  --  113* 129*  BUN 7  --  8 13  CREATININE 0.63 0.74 0.69 0.70  CALCIUM 8.6*  --  8.5* 8.2*   GFR: Estimated Creatinine Clearance: 86.2 mL/min (by C-G formula based on SCr of 0.7 mg/dL). Liver Function Tests: Recent Labs  Lab 02/12/20 0837 02/13/20 0326 02/14/20 0345  AST 17 15 90*  ALT 18 18 53*  ALKPHOS 53 56 52  BILITOT 0.5 1.0 1.1  PROT 6.5 6.7 6.3*  ALBUMIN 3.5 3.4* 3.3*   Recent Labs  Lab 02/12/20 0837  LIPASE 25   No results for input(s): AMMONIA in the last 168 hours. Coagulation Profile: No results for input(s): INR, PROTIME in the last 168 hours. Cardiac Enzymes: No results for input(s): CKTOTAL, CKMB, CKMBINDEX, TROPONINI in the last 168 hours. BNP (last 3 results) No results for input(s): PROBNP in the last 8760 hours. HbA1C: Recent Labs    02/13/20 0326  HGBA1C 6.3*   CBG: Recent Labs  Lab 02/13/20 1802 02/13/20 2044 02/14/20 0153 02/14/20 0802 02/14/20 1401  GLUCAP 156* 137* 144* 114* 130*   Lipid Profile: No results for input(s): CHOL, HDL, LDLCALC, TRIG, CHOLHDL, LDLDIRECT in the last 72 hours. Thyroid Function Tests: No results for input(s): TSH, T4TOTAL, FREET4, T3FREE, THYROIDAB in the last 72 hours. Anemia Panel: No results for input(s): VITAMINB12, FOLATE, FERRITIN, TIBC, IRON, RETICCTPCT in the last 72 hours. Sepsis Labs: No results for input(s): PROCALCITON, LATICACIDVEN in the last 168 hours.  Recent Results (from the past 240 hour(s))  Respiratory Panel by RT PCR (Flu A&B,  Covid) - Nasopharyngeal Swab     Status: None   Collection Time: 02/12/20 11:00 AM   Specimen: Nasopharyngeal Swab  Result Value Ref Range Status   SARS Coronavirus 2 by RT PCR NEGATIVE NEGATIVE Final    Comment: (NOTE) SARS-CoV-2 target nucleic acids are NOT DETECTED.  The SARS-CoV-2 RNA is generally detectable in upper respiratoy specimens during the acute phase of infection. The lowest concentration of SARS-CoV-2 viral copies this assay can detect is 131 copies/mL. A negative result does not preclude SARS-Cov-2 infection and should not be used as the sole basis for treatment or other patient management decisions. A negative result may occur with  improper specimen collection/handling, submission of specimen other than nasopharyngeal swab, presence of viral mutation(s) within the areas targeted by this assay,  and inadequate number of viral copies (<131 copies/mL). A negative result must be combined with clinical observations, patient history, and epidemiological information. The expected result is Negative.  Fact Sheet for Patients:  PinkCheek.be  Fact Sheet for Healthcare Providers:  GravelBags.it  This test is no t yet approved or cleared by the [place] FDA and  has been authorized for detection and/or diagnosis of SARS-CoV-2 by FDA under an Emergency Use Authorization (EUA). This EUA will remain  in effect (meaning this test can be used) for the duration of the COVID-19 declaration under Section 564(b)(1) of the Act, 21 U.S.C. section 360bbb-3(b)(1), unless the authorization is terminated or revoked sooner.     Influenza A by PCR NEGATIVE NEGATIVE Final   Influenza B by PCR NEGATIVE NEGATIVE Final    Comment: (NOTE) The Xpert Xpress SARS-CoV-2/FLU/RSV assay is intended as an aid in  the diagnosis of influenza from Nasopharyngeal swab specimens and  should not be used as a sole basis for treatment. Nasal  washings and  aspirates are unacceptable for Xpert Xpress SARS-CoV-2/FLU/RSV  testing.  Fact Sheet for Patients: PinkCheek.be  Fact Sheet for Healthcare Providers: GravelBags.it  This test is not yet approved or cleared by the [place] FDA and  has been authorized for detection and/or diagnosis of SARS-CoV-2 by  FDA under an Emergency Use Authorization (EUA). This EUA will remain  in effect (meaning this test can be used) for the duration of the  Covid-19 declaration under Section 564(b)(1) of the Act, 21  U.S.C. section 360bbb-3(b)(1), unless the authorization is  terminated or revoked. Performed at [facility], [address], [place], [place] [address]   Surgical pcr screen     Status: None   Collection Time: 02/13/20 12:24 PM   Specimen: Nasal Mucosa; Nasal Swab  Result Value Ref Range Status   MRSA, PCR NEGATIVE NEGATIVE Final   Staphylococcus aureus NEGATIVE NEGATIVE Final    Comment: (NOTE) The Xpert SA Assay (FDA approved for NASAL specimens in patients 61 years of age and older), is one component of a comprehensive surveillance program. It is not intended to diagnose infection nor to guide or monitor treatment. Performed at [facility], [place] [address], [place], [place] [address]          Radiology Studies: MR 3D Recon At Scanner  Result Date: 02/12/2020 CLINICAL DATA:  Right upper quadrant pain, cholelithiasis, common bile duct dilatation on prior ultrasound EXAM: MRI ABDOMEN WITHOUT AND WITH CONTRAST (INCLUDING MRCP) TECHNIQUE: Multiplanar multisequence MR imaging of the abdomen was performed both before and after the administration of intravenous contrast. Heavily T2-weighted images of the biliary and pancreatic ducts were obtained, and three-dimensional MRCP images were rendered by post processing. CONTRAST:  23mL GADAVIST GADOBUTROL 1 MMOL/ML IV SOLN COMPARISON:   Same-day right upper quadrant ultrasound FINDINGS: Lower chest: No acute findings. Hepatobiliary: No mass or other parenchymal abnormality identified. Numerous gallstones in the distended gallbladder with mild gallbladder wall thickening and trace pericholecystic fluid. There appears to be a gallstone in the cystic duct measuring 5 mm (series 10, image 26, series 3, image 18). There is no dilatation of the common bile duct and no filling defect to the ampulla. Pancreas: No mass, inflammatory changes, or other parenchymal abnormality identified. Spleen:  Within normal limits in size and appearance. Adrenals/Urinary Tract: No masses identified. No evidence of hydronephrosis. Stomach/Bowel: Visualized portions within the abdomen are unremarkable. Vascular/Lymphatic: No pathologically enlarged lymph nodes identified. No abdominal aortic aneurysm  demonstrated. Other:  None. Musculoskeletal: No suspicious bone lesions identified. IMPRESSION: 1. Numerous gallstones in the distended gallbladder with mild gallbladder wall thickening and trace pericholecystic fluid. There appears to be a 5 mm gallstone in the cystic duct. Findings are concerning for acute cholecystitis. 2. There is no dilatation of the common bile duct and no filling defect to the ampulla. These results will be called to the ordering clinician or representative by the Radiologist Assistant, and communication documented in the PACS or [employer]. Electronically Signed   By: Eddie Candle M.D.   On: 02/12/2020 21:55   MR ABDOMEN MRCP W WO CONTAST  Result Date: 02/12/2020 CLINICAL DATA:  Right upper quadrant pain, cholelithiasis, common bile duct dilatation on prior ultrasound EXAM: MRI ABDOMEN WITHOUT AND WITH CONTRAST (INCLUDING MRCP) TECHNIQUE: Multiplanar multisequence MR imaging of the abdomen was performed both before and after the administration of intravenous contrast. Heavily T2-weighted images of the biliary and pancreatic ducts were  obtained, and three-dimensional MRCP images were rendered by post processing. CONTRAST:  52mL GADAVIST GADOBUTROL 1 MMOL/ML IV SOLN COMPARISON:  Same-day right upper quadrant ultrasound FINDINGS: Lower chest: No acute findings. Hepatobiliary: No mass or other parenchymal abnormality identified. Numerous gallstones in the distended gallbladder with mild gallbladder wall thickening and trace pericholecystic fluid. There appears to be a gallstone in the cystic duct measuring 5 mm (series 10, image 26, series 3, image 18). There is no dilatation of the common bile duct and no filling defect to the ampulla. Pancreas: No mass, inflammatory changes, or other parenchymal abnormality identified. Spleen:  Within normal limits in size and appearance. Adrenals/Urinary Tract: No masses identified. No evidence of hydronephrosis. Stomach/Bowel: Visualized portions within the abdomen are unremarkable. Vascular/Lymphatic: No pathologically enlarged lymph nodes identified. No abdominal aortic aneurysm demonstrated. Other:  None. Musculoskeletal: No suspicious bone lesions identified. IMPRESSION: 1. Numerous gallstones in the distended gallbladder with mild gallbladder wall thickening and trace pericholecystic fluid. There appears to be a 5 mm gallstone in the cystic duct. Findings are concerning for acute cholecystitis. 2. There is no dilatation of the common bile duct and no filling defect to the ampulla. These results will be called to the ordering clinician or representative by the Radiologist Assistant, and communication documented in the PACS or [employer]. Electronically Signed   By: Eddie Candle M.D.   On: 02/12/2020 21:55        Scheduled Meds: . acetaminophen  1,000 mg Oral Q8H  . enoxaparin (LOVENOX) injection  40 mg Subcutaneous Q24H  . escitalopram  10 mg Oral Daily  . insulin aspart  0-9 Units Subcutaneous Q6H  . irbesartan  150 mg Oral Daily  . ketorolac  15 mg Intravenous Q8H  . metoprolol tartrate   25 mg Oral BID   Continuous Infusions: . piperacillin-tazobactam (ZOSYN)  IV 3.375 g (02/14/20 1055)     LOS: 2 days    Time spent: 30 minutes    Barb Merino, MD [facility] Pager [phone]

## 2020-02-14 NOTE — Progress Notes (Signed)
1 Day Post-Op lap cholecystectomy Subjective: Pain controlled.  Tolerating a diet  Objective: Vital signs in last 24 hours: Temp:  [97.7 F (36.5 C)-98.7 F (37.1 C)] 98.1 F (36.7 C) (11/13 0603) Pulse Rate:  [73-98] 84 (11/13 0603) Resp:  [15-22] 16 (11/13 0603) BP: (111-158)/(64-88) 112/64 (11/13 0603) SpO2:  [91 %-99 %] 91 % (11/13 0603) Weight:  [91.2 kg] 91.2 kg (11/12 1400)   Intake/Output from previous day: 11/12 0701 - 11/13 0700 In: 2948 [P.O.:840; I.V.:2059.9; IV Piggyback:48.2] Out: 988 [Urine:850; Blood:100] Intake/Output this shift: No intake/output data recorded.   General appearance: alert and cooperative GI: normal findings: soft, non-tender  Incision: no significant drainage  Lab Results:  Recent Labs    02/13/20 0326 02/14/20 0345  WBC 11.5* 13.6*  HGB 14.5 13.0  HCT 43.8 39.8  PLT 163 162   BMET Recent Labs    02/13/20 0326 02/14/20 0345  NA 135 134*  K 4.3 5.1  CL 99 99  CO2 26 26  GLUCOSE 113* 129*  BUN 8 13  CREATININE 0.69 0.70  CALCIUM 8.5* 8.2*   PT/INR No results for input(s): LABPROT, INR in the last 72 hours. ABG No results for input(s): PHART, HCO3 in the last 72 hours.  Invalid input(s): PCO2, PO2  MEDS, Scheduled . acetaminophen  1,000 mg Oral Q8H  . enoxaparin (LOVENOX) injection  40 mg Subcutaneous Q24H  . escitalopram  10 mg Oral Daily  . insulin aspart  0-9 Units Subcutaneous Q6H  . irbesartan  150 mg Oral Daily  . ketorolac  15 mg Intravenous Q8H  . metoprolol tartrate  25 mg Oral BID    Studies/Results: MR 3D Recon At Scanner  Result Date: 02/12/2020 CLINICAL DATA:  Right upper quadrant pain, cholelithiasis, common bile duct dilatation on prior ultrasound EXAM: MRI ABDOMEN WITHOUT AND WITH CONTRAST (INCLUDING MRCP) TECHNIQUE: Multiplanar multisequence MR imaging of the abdomen was performed both before and after the administration of intravenous contrast. Heavily T2-weighted images of the biliary and  pancreatic ducts were obtained, and three-dimensional MRCP images were rendered by post processing. CONTRAST:  6mL GADAVIST GADOBUTROL 1 MMOL/ML IV SOLN COMPARISON:  Same-day right upper quadrant ultrasound FINDINGS: Lower chest: No acute findings. Hepatobiliary: No mass or other parenchymal abnormality identified. Numerous gallstones in the distended gallbladder with mild gallbladder wall thickening and trace pericholecystic fluid. There appears to be a gallstone in the cystic duct measuring 5 mm (series 10, image 26, series 3, image 18). There is no dilatation of the common bile duct and no filling defect to the ampulla. Pancreas: No mass, inflammatory changes, or other parenchymal abnormality identified. Spleen:  Within normal limits in size and appearance. Adrenals/Urinary Tract: No masses identified. No evidence of hydronephrosis. Stomach/Bowel: Visualized portions within the abdomen are unremarkable. Vascular/Lymphatic: No pathologically enlarged lymph nodes identified. No abdominal aortic aneurysm demonstrated. Other:  None. Musculoskeletal: No suspicious bone lesions identified. IMPRESSION: 1. Numerous gallstones in the distended gallbladder with mild gallbladder wall thickening and trace pericholecystic fluid. There appears to be a 5 mm gallstone in the cystic duct. Findings are concerning for acute cholecystitis. 2. There is no dilatation of the common bile duct and no filling defect to the ampulla. These results will be called to the ordering clinician or representative by the Radiologist Assistant, and communication documented in the PACS or [employer]. Electronically Signed   By: Eddie Candle M.D.   On: 02/12/2020 21:55   MR ABDOMEN MRCP W WO CONTAST  Result Date: 02/12/2020 CLINICAL  DATA:  Right upper quadrant pain, cholelithiasis, common bile duct dilatation on prior ultrasound EXAM: MRI ABDOMEN WITHOUT AND WITH CONTRAST (INCLUDING MRCP) TECHNIQUE: Multiplanar multisequence MR imaging of  the abdomen was performed both before and after the administration of intravenous contrast. Heavily T2-weighted images of the biliary and pancreatic ducts were obtained, and three-dimensional MRCP images were rendered by post processing. CONTRAST:  64mL GADAVIST GADOBUTROL 1 MMOL/ML IV SOLN COMPARISON:  Same-day right upper quadrant ultrasound FINDINGS: Lower chest: No acute findings. Hepatobiliary: No mass or other parenchymal abnormality identified. Numerous gallstones in the distended gallbladder with mild gallbladder wall thickening and trace pericholecystic fluid. There appears to be a gallstone in the cystic duct measuring 5 mm (series 10, image 26, series 3, image 18). There is no dilatation of the common bile duct and no filling defect to the ampulla. Pancreas: No mass, inflammatory changes, or other parenchymal abnormality identified. Spleen:  Within normal limits in size and appearance. Adrenals/Urinary Tract: No masses identified. No evidence of hydronephrosis. Stomach/Bowel: Visualized portions within the abdomen are unremarkable. Vascular/Lymphatic: No pathologically enlarged lymph nodes identified. No abdominal aortic aneurysm demonstrated. Other:  None. Musculoskeletal: No suspicious bone lesions identified. IMPRESSION: 1. Numerous gallstones in the distended gallbladder with mild gallbladder wall thickening and trace pericholecystic fluid. There appears to be a 5 mm gallstone in the cystic duct. Findings are concerning for acute cholecystitis. 2. There is no dilatation of the common bile duct and no filling defect to the ampulla. These results will be called to the ordering clinician or representative by the Radiologist Assistant, and communication documented in the PACS or [employer]. Electronically Signed   By: Eddie Candle M.D.   On: 02/12/2020 21:55    Assessment: s/p Procedure(s): LAPAROSCOPIC CHOLECYSTECTOMY Patient Active Problem List   Diagnosis Date Noted  . Cystic duct calculus    . RUQ pain 02/12/2020  . Calculus of gallbladder and bile duct with obstruction without cholecystitis   . Excessive daytime sleepiness 06/21/2018  . 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 Abnormal dreams 06/21/2018  . IDA (iron deficiency anemia) 06/05/2018  . OSA (obstructive sleep apnea) 01/19/2016  . Tobacco abuse 03/30/2014  . Drug overdose 03/04/2014  . Normocytic anemia 03/04/2014  . 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 Controlled diabetes mellitus type II without complication ([place]) 03/55/9741  . MENOPAUSAL SYNDROME 03/10/2010  . PAIN IN JOINT, MULTIPLE SITES 12/28/2009  . MYALGIA 12/28/2009  . Essential hypertension 12/14/2009  . Chronic nonalcoholic liver disease 63/84/5364  . UTI'S, HX OF 12/03/2009  . INSOMNIA 11/08/2009  . DYSPHAGIA UNSPECIFIED 11/08/2009  . Depression 01/08/2009  . URINARY INCONTINENCE 01/08/2009    Expected post op course  Plan: Cont abx for 5 days total Encourage PO pain meds Possible d/c tomorrow   LOS: 2 days     .Rosario Adie, MD [facility], [place]    02/14/2020 10:15 AM

## 2020-02-14 NOTE — Progress Notes (Signed)
Progress Note   Subjective  Chief Complaint: Acute cholecystitis with cystic duct stone,status post cholecystectomy 02/13/2020  Patient doing fairly well this morning.  Review of her surgical report shows some complications, patient now has a biliary drain.  She does have some discomfort in the right upper quadrant related to this but no new symptoms overnight.  She has a lot of questions.   Objective   Vital signs in last 24 hours: Temp:  [97.7 F (36.5 C)-98.7 F (37.1 C)] 98.1 F (36.7 C) (11/13 0603) Pulse Rate:  [73-98] 84 (11/13 0603) Resp:  [15-22] 16 (11/13 0603) BP: (111-158)/(64-88) 112/64 (11/13 0603) SpO2:  [91 %-99 %] 91 % (11/13 0603) Weight:  [91.2 kg] 91.2 kg (11/12 1400) Last BM Date: 02/10/20 General:    white female in NAD Heart:  Regular rate and rhythm; no murmurs Lungs: Respirations even and unlabored, lungs CTA bilaterally Abdomen:  Soft, mild right upper quadrant TTP, clean bandaging, biliary drain in place with 25 cc of fluid and nondistended. Normal bowel sounds. Extremities:  Without edema. Neurologic:  Alert and oriented,  grossly normal neurologically. Psych:  Cooperative. Normal mood and affect.  Intake/Output from previous day: 11/12 0701 - 11/13 0700 In: 2948 [P.O.:840; I.V.:2059.9; IV Piggyback:48.2] Out: 988 [Urine:850; Blood:100]  Lab Results: Recent Labs    02/12/20 1827 02/13/20 0326 02/14/20 0345  WBC 11.3* 11.5* 13.6*  HGB 15.1* 14.5 13.0  HCT 45.7 43.8 39.8  PLT 179 163 162   BMET Recent Labs    02/12/20 0837 02/12/20 0837 02/12/20 1827 02/13/20 0326 02/14/20 0345  NA 132*  --   --  135 134*  K 3.8  --   --  4.3 5.1  CL 96*  --   --  99 99  CO2 24  --   --  26 26  GLUCOSE 128*  --   --  113* 129*  BUN 7  --   --  8 13  CREATININE 0.63   < > 0.74 0.69 0.70  CALCIUM 8.6*  --   --  8.5* 8.2*   < > = values in this interval not displayed.   LFT Recent Labs    02/14/20 0345  PROT 6.3*  ALBUMIN 3.3*  AST 90*    ALT 53*  ALKPHOS 52  BILITOT 1.1    Studies/Results: MR 3D Recon At Scanner  Result Date: 02/12/2020 CLINICAL DATA:  Right upper quadrant pain, cholelithiasis, common bile duct dilatation on prior ultrasound EXAM: MRI ABDOMEN WITHOUT AND WITH CONTRAST (INCLUDING MRCP) TECHNIQUE: Multiplanar multisequence MR imaging of the abdomen was performed both before and after the administration of intravenous contrast. Heavily T2-weighted images of the biliary and pancreatic ducts were obtained, and three-dimensional MRCP images were rendered by post processing. CONTRAST:  61mL GADAVIST GADOBUTROL 1 MMOL/ML IV SOLN COMPARISON:  Same-day right upper quadrant ultrasound FINDINGS: Lower chest: No acute findings. Hepatobiliary: No mass or other parenchymal abnormality identified. Numerous gallstones in the distended gallbladder with mild gallbladder wall thickening and trace pericholecystic fluid. There appears to be a gallstone in the cystic duct measuring 5 mm (series 10, image 26, series 3, image 18). There is no dilatation of the common bile duct and no filling defect to the ampulla. Pancreas: No mass, inflammatory changes, or other parenchymal abnormality identified. Spleen:  Within normal limits in size and appearance. Adrenals/Urinary Tract: No masses identified. No evidence of hydronephrosis. Stomach/Bowel: Visualized portions within the abdomen are unremarkable. Vascular/Lymphatic: No pathologically enlarged lymph nodes  identified. No abdominal aortic aneurysm demonstrated. Other:  None. Musculoskeletal: No suspicious bone lesions identified. IMPRESSION: 1. Numerous gallstones in the distended gallbladder with mild gallbladder wall thickening and trace pericholecystic fluid. There appears to be a 5 mm gallstone in the cystic duct. Findings are concerning for acute cholecystitis. 2. There is no dilatation of the common bile duct and no filling defect to the ampulla. These results will be called to the ordering  clinician or representative by the Radiologist Assistant, and communication documented in the PACS or [employer]. Electronically Signed   By: Eddie Candle M.D.   On: 02/12/2020 21:55   MR ABDOMEN MRCP W WO CONTAST  Result Date: 02/12/2020 CLINICAL DATA:  Right upper quadrant pain, cholelithiasis, common bile duct dilatation on prior ultrasound EXAM: MRI ABDOMEN WITHOUT AND WITH CONTRAST (INCLUDING MRCP) TECHNIQUE: Multiplanar multisequence MR imaging of the abdomen was performed both before and after the administration of intravenous contrast. Heavily T2-weighted images of the biliary and pancreatic ducts were obtained, and three-dimensional MRCP images were rendered by post processing. CONTRAST:  91mL GADAVIST GADOBUTROL 1 MMOL/ML IV SOLN COMPARISON:  Same-day right upper quadrant ultrasound FINDINGS: Lower chest: No acute findings. Hepatobiliary: No mass or other parenchymal abnormality identified. Numerous gallstones in the distended gallbladder with mild gallbladder wall thickening and trace pericholecystic fluid. There appears to be a gallstone in the cystic duct measuring 5 mm (series 10, image 26, series 3, image 18). There is no dilatation of the common bile duct and no filling defect to the ampulla. Pancreas: No mass, inflammatory changes, or other parenchymal abnormality identified. Spleen:  Within normal limits in size and appearance. Adrenals/Urinary Tract: No masses identified. No evidence of hydronephrosis. Stomach/Bowel: Visualized portions within the abdomen are unremarkable. Vascular/Lymphatic: No pathologically enlarged lymph nodes identified. No abdominal aortic aneurysm demonstrated. Other:  None. Musculoskeletal: No suspicious bone lesions identified. IMPRESSION: 1. Numerous gallstones in the distended gallbladder with mild gallbladder wall thickening and trace pericholecystic fluid. There appears to be a 5 mm gallstone in the cystic duct. Findings are concerning for acute  cholecystitis. 2. There is no dilatation of the common bile duct and no filling defect to the ampulla. These results will be called to the ordering clinician or representative by the Radiologist Assistant, and communication documented in the PACS or [employer]. Electronically Signed   By: Eddie Candle M.D.   On: 02/12/2020 21:55       Assessment / Plan:   Assessment: 1.  Acute cholecystitis with cystic duct stone: Status post cholecystectomy 19/50/9326 with complications leaving the patient with a biliary drain 2.  Right upper quadrant pain: With above  Plan: 1.  At this time we will leave the patient in the care of the surgical team and the hospitalist.  If you require our services further, please let us know. 2.  Please await any final recommendations from Dr. Tarri Glenn.  Thank you for kind consultation.    LOS: 2 days   Levin Erp  02/14/2020, 11:54 AM

## 2020-02-15 DIAGNOSIS — R1011 Right upper quadrant pain: Secondary | ICD-10-CM | POA: Diagnosis not present

## 2020-02-15 DIAGNOSIS — K8020 Calculus of gallbladder without cholecystitis without obstruction: Secondary | ICD-10-CM | POA: Diagnosis not present

## 2020-02-15 LAB — CBC WITH DIFFERENTIAL/PLATELET
Abs Immature Granulocytes: 0.04 10*3/uL (ref 0.00–0.07)
Basophils Absolute: 0 10*3/uL (ref 0.0–0.1)
Basophils Relative: 0 %
Eosinophils Absolute: 0.1 10*3/uL (ref 0.0–0.5)
Eosinophils Relative: 1 %
HCT: 35.8 % — ABNORMAL LOW (ref 36.0–46.0)
Hemoglobin: 11.8 g/dL — ABNORMAL LOW (ref 12.0–15.0)
Immature Granulocytes: 1 %
Lymphocytes Relative: 13 %
Lymphs Abs: 0.8 10*3/uL (ref 0.7–4.0)
MCH: 32.1 pg (ref 26.0–34.0)
MCHC: 33 g/dL (ref 30.0–36.0)
MCV: 97.3 fL (ref 80.0–100.0)
Monocytes Absolute: 0.9 10*3/uL (ref 0.1–1.0)
Monocytes Relative: 15 %
Neutro Abs: 4.2 10*3/uL (ref 1.7–7.7)
Neutrophils Relative %: 70 %
Platelets: 169 10*3/uL (ref 150–400)
RBC: 3.68 MIL/uL — ABNORMAL LOW (ref 3.87–5.11)
RDW: 12.6 % (ref 11.5–15.5)
WBC: 5.9 10*3/uL (ref 4.0–10.5)
nRBC: 0 % (ref 0.0–0.2)

## 2020-02-15 LAB — GLUCOSE, CAPILLARY
Glucose-Capillary: 167 mg/dL — ABNORMAL HIGH (ref 70–99)
Glucose-Capillary: 128 mg/dL — ABNORMAL HIGH (ref 70–99)
Glucose-Capillary: 132 mg/dL — ABNORMAL HIGH (ref 70–99)
Glucose-Capillary: 149 mg/dL — ABNORMAL HIGH (ref 70–99)
Glucose-Capillary: 155 mg/dL — ABNORMAL HIGH (ref 70–99)

## 2020-02-15 LAB — COMPREHENSIVE METABOLIC PANEL
ALT: 173 U/L — ABNORMAL HIGH (ref 0–44)
AST: 232 U/L — ABNORMAL HIGH (ref 15–41)
Albumin: 2.9 g/dL — ABNORMAL LOW (ref 3.5–5.0)
Alkaline Phosphatase: 147 U/L — ABNORMAL HIGH (ref 38–126)
Anion gap: 9 (ref 5–15)
BUN: 13 mg/dL (ref 6–20)
CO2: 26 mmol/L (ref 22–32)
Calcium: 7.7 mg/dL — ABNORMAL LOW (ref 8.9–10.3)
Chloride: 96 mmol/L — ABNORMAL LOW (ref 98–111)
Creatinine, Ser: 0.68 mg/dL (ref 0.44–1.00)
GFR, Estimated: >60 mL/min (ref >60)
Glucose, Bld: 166 mg/dL — ABNORMAL HIGH (ref 70–99)
Potassium: 4 mmol/L (ref 3.5–5.1)
Sodium: 131 mmol/L — ABNORMAL LOW (ref 135–145)
Total Bilirubin: 1.5 mg/dL — ABNORMAL HIGH (ref 0.3–1.2)
Total Protein: 5.6 g/dL — ABNORMAL LOW (ref 6.5–8.1)

## 2020-02-15 MED ORDER — DIPHENHYDRAMINE HCL 25 MG PO CAPS
25.0000 mg | ORAL_CAPSULE | Freq: Three times a day (TID) | ORAL | Status: DC | PRN
  Administered 2020-02-15: 25 mg via ORAL
  Filled 2020-02-15: qty 1

## 2020-02-15 MED ORDER — HYOSCYAMINE SULFATE ER 0.375 MG PO TB12
0.3750 mg | ORAL_TABLET | Freq: Once | ORAL | Status: AC
  Administered 2020-02-15: 0.375 mg via ORAL
  Filled 2020-02-15: qty 1

## 2020-02-15 MED ORDER — METHOCARBAMOL 500 MG PO TABS
500.0000 mg | ORAL_TABLET | Freq: Once | ORAL | Status: AC
  Administered 2020-02-15: 500 mg via ORAL
  Filled 2020-02-15: qty 1

## 2020-02-15 MED ORDER — ACETAMINOPHEN 160 MG/5ML PO SOLN
1000.0000 mg | Freq: Three times a day (TID) | ORAL | Status: DC
  Administered 2020-02-15 (×2): 1000 mg via ORAL
  Filled 2020-02-15 (×2): qty 40.6

## 2020-02-15 NOTE — Progress Notes (Signed)
PROGRESS NOTE    Amber Mata  HFW:[id number] DOB: June 11, 1960 DOA: 02/12/2020 PCP: Berkley Harvey, NP    Brief Narrative:  Patient with history of diabetes, gastric bypass, hypertension, anxiety and recent seizure-like enterocolitis presented to [facility] with abdominal pain of about 2 days duration, nausea without vomiting.  Also associated loose stool.  Recently completed antibiotic therapy for shigella.  In the emergency room patient is afebrile, hypertensive.  On room air.  Right upper quadrant ultrasound with cholelithiasis without gallbladder wall thickening however positive Murphy sign.  Transfer to hospital.  LFTs normal.  MRI showed acute cholecystitis with 5 mm cystic duct stone and patent bile duct.   Assessment & Plan:   Principal Problem:   RUQ pain Active Problems:   Depression   Essential hypertension   Controlled diabetes mellitus type II without complication (HCC)   Calculus of gallbladder and bile duct with obstruction without cholecystitis   Cystic duct calculus   Abnormal liver enzymes  Acute calculus cholecystitis with cystic duct stone: MRCP with no biliary duct obstruction.   Status post lap chole, day 2 postop today.   Continue Zosyn.  Will change to oral antibiotics on discharge for total 5 days.  Postop management as per surgery. Encouraging oral pain medication intake. Initial normal LFTs.  Slightly elevated LFTs today, possibly related to postsurgical edema.  Will recheck tomorrow morning to ensure stabilization.  Essential hypertension: Blood pressures remained stable on Lopressor and olmesartan that she will continue.  Anxiety and depression: Patient is on benzodiazepine and SSRI.  We will continue.  Type 2 diabetes: Well controlled.  Patient is on Jardiance and Metformin at home.  Hold oral hypoglycemics.  We will keep on sliding scale insulin until she is able to eat.  DVT prophylaxis: enoxaparin (LOVENOX) injection 40 mg Start:  02/14/20 1800   Code Status: DNR Family Communication: Husband at the bedside Disposition Plan: Status is: Inpatient  Remains inpatient appropriate because:Inpatient level of care appropriate due to severity of illness   Dispo: The patient is from: Home              Anticipated d/c is to: Home              Anticipated d/c date is tomorrow.              Patient currently is not medically stable to d/c.         Consultants:   Gastroenterology  Surgery  Procedures:   None  Antimicrobials:   Zosyn, 11/12---   Subjective: Patient seen and examined.  Husband at the bedside.  No overnight events.  Is still having significant right upper quadrant abdominal pain.  She would try tramadol, however she does not want to take any medicine when she is not eating.  Passing flatus but no bowel movement.  Only the JP drain insertion site hurts.  Objective: Vitals:   02/14/20 2200 02/15/20 0143 02/15/20 0556 02/15/20 0915  BP: (!) 96/56 113/63 122/75 106/72  Pulse: 99 100 96 84  Resp: 18 18 18    Temp: 98.8 F (37.1 C) 98.2 F (36.8 C) 98.4 F (36.9 C)   TempSrc: Oral Oral Oral   SpO2: 94% 94% 97%   Weight:      Height:        Intake/Output Summary (Last 24 hours) at 02/15/2020 1153 Last data filed at 02/15/2020 1117 Gross per 24 hour  Intake 1660.04 ml  Output 1135 ml  Net 525.04  ml   Filed Weights   02/12/20 0740 02/12/20 1548 02/13/20 1400  Weight: 91.6 kg 91.2 kg 91.2 kg    Examination:  General exam: Appears calm and comfortable.  Not in any distress. Respiratory system: Clear to auscultation. Respiratory effort normal. Cardiovascular system: S1 & S2 heard, RRR. No JVD, murmurs, rubs, gallops or clicks. No pedal edema. Gastrointestinal system: Soft.  Tender as appropriate from postop.  Right upper quadrant biliary drain with minimal serosanguineous/biliary secretion. Central nervous system: Alert and oriented. No focal neurological deficits. Extremities:  Symmetric 5 x 5 power. Skin: No rashes, lesions or ulcers Psychiatry: Judgement and insight appear normal. Mood & affect anxious.    Data Reviewed: I have personally reviewed following labs and imaging studies  CBC: Recent Labs  Lab 02/12/20 0837 02/12/20 1827 02/13/20 0326 02/14/20 0345 02/15/20 0724  WBC 13.2* 11.3* 11.5* 13.6* 5.9  NEUTROABS 10.0*  --   --   --  4.2  HGB 15.1* 15.1* 14.5 13.0 11.8*  HCT 44.2 45.7 43.8 39.8 35.8*  MCV 93.4 97.0 97.6 98.0 97.3  PLT 205 179 163 162 387   Basic Metabolic Panel: Recent Labs  Lab 02/12/20 0837 02/12/20 1827 02/13/20 0326 02/14/20 0345 02/15/20 0724  NA 132*  --  135 134* 131*  K 3.8  --  4.3 5.1 4.0  CL 96*  --  99 99 96*  CO2 24  --  26 26 26   GLUCOSE 128*  --  113* 129* 166*  BUN 7  --  8 13 13   CREATININE 0.63 0.74 0.69 0.70 0.68  CALCIUM 8.6*  --  8.5* 8.2* 7.7*   GFR: Estimated Creatinine Clearance: 86.2 mL/min (by C-G formula based on SCr of 0.68 mg/dL). Liver Function Tests: Recent Labs  Lab 02/12/20 0837 02/13/20 0326 02/14/20 0345 02/15/20 0724  AST 17 15 90* 232*  ALT 18 18 53* 173*  ALKPHOS 53 56 52 147*  BILITOT 0.5 1.0 1.1 1.5*  PROT 6.5 6.7 6.3* 5.6*  ALBUMIN 3.5 3.4* 3.3* 2.9*   Recent Labs  Lab 02/12/20 0837  LIPASE 25   No results for input(s): AMMONIA in the last 168 hours. Coagulation Profile: No results for input(s): INR, PROTIME in the last 168 hours. Cardiac Enzymes: No results for input(s): CKTOTAL, CKMB, CKMBINDEX, TROPONINI in the last 168 hours. BNP (last 3 results) No results for input(s): PROBNP in the last 8760 hours. HbA1C: Recent Labs    02/13/20 0326  HGBA1C 6.3*   CBG: Recent Labs  Lab 02/14/20 1401 02/14/20 1713 02/14/20 2146 02/15/20 0736 02/15/20 1148  GLUCAP 130* 121* 140* 167* 128*   Lipid Profile: No results for input(s): CHOL, HDL, LDLCALC, TRIG, CHOLHDL, LDLDIRECT in the last 72 hours. Thyroid Function Tests: No results for input(s): TSH,  T4TOTAL, FREET4, T3FREE, THYROIDAB in the last 72 hours. Anemia Panel: No results for input(s): VITAMINB12, FOLATE, FERRITIN, TIBC, IRON, RETICCTPCT in the last 72 hours. Sepsis Labs: No results for input(s): PROCALCITON, LATICACIDVEN in the last 168 hours.  Recent Results (from the past 240 hour(s))  Respiratory Panel by RT PCR (Flu A&B, Covid) - Nasopharyngeal Swab     Status: None   Collection Time: 02/12/20 11:00 AM   Specimen: Nasopharyngeal Swab  Result Value Ref Range Status   SARS Coronavirus 2 by RT PCR NEGATIVE NEGATIVE Final    Comment: (NOTE) SARS-CoV-2 target nucleic acids are NOT DETECTED.  The SARS-CoV-2 RNA is generally detectable in upper respiratoy specimens during the acute phase of infection.  The lowest concentration of SARS-CoV-2 viral copies this assay can detect is 131 copies/mL. A negative result does not preclude SARS-Cov-2 infection and should not be used as the sole basis for treatment or other patient management decisions. A negative result may occur with  improper specimen collection/handling, submission of specimen other than nasopharyngeal swab, presence of viral mutation(s) within the areas targeted by this assay, and inadequate number of viral copies (<131 copies/mL). A negative result must be combined with clinical observations, patient history, and epidemiological information. The expected result is Negative.  Fact Sheet for Patients:  PinkCheek.be  Fact Sheet for Healthcare Providers:  GravelBags.it  This test is no t yet approved or cleared by the [place] FDA and  has been authorized for detection and/or diagnosis of SARS-CoV-2 by FDA under an Emergency Use Authorization (EUA). This EUA will remain  in effect (meaning this test can be used) for the duration of the COVID-19 declaration under Section 564(b)(1) of the Act, 21 U.S.C. section 360bbb-3(b)(1), unless the authorization  is terminated or revoked sooner.     Influenza A by PCR NEGATIVE NEGATIVE Final   Influenza B by PCR NEGATIVE NEGATIVE Final    Comment: (NOTE) The Xpert Xpress SARS-CoV-2/FLU/RSV assay is intended as an aid in  the diagnosis of influenza from Nasopharyngeal swab specimens and  should not be used as a sole basis for treatment. Nasal washings and  aspirates are unacceptable for Xpert Xpress SARS-CoV-2/FLU/RSV  testing.  Fact Sheet for Patients: PinkCheek.be  Fact Sheet for Healthcare Providers: GravelBags.it  This test is not yet approved or cleared by the [place] FDA and  has been authorized for detection and/or diagnosis of SARS-CoV-2 by  FDA under an Emergency Use Authorization (EUA). This EUA will remain  in effect (meaning this test can be used) for the duration of the  Covid-19 declaration under Section 564(b)(1) of the Act, 21  U.S.C. section 360bbb-3(b)(1), unless the authorization is  terminated or revoked. Performed at [facility], [address], [place], [place] [address]   Surgical pcr screen     Status: None   Collection Time: 02/13/20 12:24 PM   Specimen: Nasal Mucosa; Nasal Swab  Result Value Ref Range Status   MRSA, PCR NEGATIVE NEGATIVE Final   Staphylococcus aureus NEGATIVE NEGATIVE Final    Comment: (NOTE) The Xpert SA Assay (FDA approved for NASAL specimens in patients 14 years of age and older), is one component of a comprehensive surveillance program. It is not intended to diagnose infection nor to guide or monitor treatment. Performed at [facility], [place] [address], [place], [place] [address]          Radiology Studies: No results found.      Scheduled Meds: . acetaminophen  1,000 mg Oral Q8H  . enoxaparin (LOVENOX) injection  40 mg Subcutaneous Q24H  . escitalopram  10 mg Oral Daily  . insulin aspart  0-5 Units Subcutaneous QHS  . insulin  aspart  0-9 Units Subcutaneous TID WC  . irbesartan  150 mg Oral Daily  . ketorolac  15 mg Intravenous Q8H  . metoprolol tartrate  25 mg Oral BID   Continuous Infusions: . piperacillin-tazobactam (ZOSYN)  IV 3.375 g (02/15/20 0931)     LOS: 3 days    Time spent: 30 minutes    Barb Merino, MD [facility] Pager [phone]

## 2020-02-15 NOTE — Progress Notes (Signed)
2 Days Post-Op lap cholecystectomy Subjective: Pain better.  Tolerating a diet but not much apettite  Objective: Vital signs in last 24 hours: Temp:  [97.7 F (36.5 C)-98.9 F (37.2 C)] 98.4 F (36.9 C) (11/14 0556) Pulse Rate:  [78-101] 96 (11/14 0556) Resp:  [18] 18 (11/14 0556) BP: (93-122)/(52-75) 122/75 (11/14 0556) SpO2:  [92 %-97 %] 97 % (11/14 0556)   Intake/Output from previous day: 11/13 0701 - 11/14 0700 In: 1111.8 [P.O.:960; IV Piggyback:151.8] Out: 260 [Urine:200; Drains:60] Intake/Output this shift: No intake/output data recorded.   General appearance: alert and cooperative GI: normal findings: soft, non-tender JP: brownish fluid Incision: no significant drainage  Lab Results:  Recent Labs    02/14/20 0345 02/15/20 0724  WBC 13.6* 5.9  HGB 13.0 11.8*  HCT 39.8 35.8*  PLT 162 169   BMET Recent Labs    02/14/20 0345 02/15/20 0724  NA 134* 131*  K 5.1 4.0  CL 99 96*  CO2 26 26  GLUCOSE 129* 166*  BUN 13 13  CREATININE 0.70 0.68  CALCIUM 8.2* 7.7*   PT/INR No results for input(s): LABPROT, INR in the last 72 hours. ABG No results for input(s): PHART, HCO3 in the last 72 hours.  Invalid input(s): PCO2, PO2  MEDS, Scheduled . acetaminophen  1,000 mg Oral Q8H  . enoxaparin (LOVENOX) injection  40 mg Subcutaneous Q24H  . escitalopram  10 mg Oral Daily  . insulin aspart  0-5 Units Subcutaneous QHS  . insulin aspart  0-9 Units Subcutaneous TID WC  . irbesartan  150 mg Oral Daily  . ketorolac  15 mg Intravenous Q8H  . metoprolol tartrate  25 mg Oral BID    Studies/Results: No results found.  Assessment: s/p Procedure(s): LAPAROSCOPIC CHOLECYSTECTOMY Patient Active Problem List   Diagnosis Date Noted  . Abnormal liver enzymes   . Cystic duct calculus   . RUQ pain 02/12/2020  . Calculus of gallbladder and bile duct with obstruction without cholecystitis   . Excessive daytime sleepiness 06/21/2018  . 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 Abnormal dreams 06/21/2018  . IDA (iron deficiency anemia) 06/05/2018  . OSA (obstructive sleep apnea) 01/19/2016  . Tobacco abuse 03/30/2014  . Drug overdose 03/04/2014  . Normocytic anemia 03/04/2014  . 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 Controlled diabetes mellitus type II without complication ([place]) 44/92/0100  . MENOPAUSAL SYNDROME 03/10/2010  . PAIN IN JOINT, MULTIPLE SITES 12/28/2009  . MYALGIA 12/28/2009  . Essential hypertension 12/14/2009  . Chronic nonalcoholic liver disease 71/21/9758  . UTI'S, HX OF 12/03/2009  . INSOMNIA 11/08/2009  . DYSPHAGIA UNSPECIFIED 11/08/2009  . Depression 01/08/2009  . URINARY INCONTINENCE 01/08/2009    Expected post op course  Plan: Cont abx for 5 days total Encourage PO pain meds T bili up to 1.5, could just be due to edema from surgery, but will need to be rechecked tomorrow   LOS: 3 days     .Rosario Adie, MD [facility], [place]    02/15/2020 8:35 AM

## 2020-02-16 ENCOUNTER — Encounter (HOSPITAL_COMMUNITY): Payer: Self-pay | Admitting: Surgery

## 2020-02-16 DIAGNOSIS — R1011 Right upper quadrant pain: Secondary | ICD-10-CM | POA: Diagnosis not present

## 2020-02-16 LAB — COMPREHENSIVE METABOLIC PANEL
ALT: 128 U/L — ABNORMAL HIGH (ref 0–44)
AST: 117 U/L — ABNORMAL HIGH (ref 15–41)
Albumin: 2.6 g/dL — ABNORMAL LOW (ref 3.5–5.0)
Alkaline Phosphatase: 131 U/L — ABNORMAL HIGH (ref 38–126)
Anion gap: 10 (ref 5–15)
BUN: 13 mg/dL (ref 6–20)
CO2: 25 mmol/L (ref 22–32)
Calcium: 7.7 mg/dL — ABNORMAL LOW (ref 8.9–10.3)
Chloride: 97 mmol/L — ABNORMAL LOW (ref 98–111)
Creatinine, Ser: 0.55 mg/dL (ref 0.44–1.00)
GFR, Estimated: >60 mL/min (ref >60)
Glucose, Bld: 136 mg/dL — ABNORMAL HIGH (ref 70–99)
Potassium: 3.6 mmol/L (ref 3.5–5.1)
Sodium: 132 mmol/L — ABNORMAL LOW (ref 135–145)
Total Bilirubin: 1.4 mg/dL — ABNORMAL HIGH (ref 0.3–1.2)
Total Protein: 5.3 g/dL — ABNORMAL LOW (ref 6.5–8.1)

## 2020-02-16 LAB — GLUCOSE, CAPILLARY: Glucose-Capillary: 127 mg/dL — ABNORMAL HIGH (ref 70–99)

## 2020-02-16 MED ORDER — TRAMADOL HCL 50 MG PO TABS: 50.0000 mg | ORAL_TABLET | Freq: Four times a day (QID) | ORAL | 0 refills | Status: DC | PRN

## 2020-02-16 MED ORDER — AMOXICILLIN-POT CLAVULANATE 875-125 MG PO TABS: 1.0000 | ORAL_TABLET | Freq: Two times a day (BID) | ORAL | 0 refills | Status: AC

## 2020-02-16 MED ORDER — AMOXICILLIN-POT CLAVULANATE 875-125 MG PO TABS: 1.0000 | ORAL_TABLET | Freq: Two times a day (BID) | ORAL | Status: DC

## 2020-02-16 MED ORDER — TRAMADOL HCL 50 MG PO TABS: 50.0000 mg | ORAL_TABLET | Freq: Four times a day (QID) | ORAL | 0 refills | Status: AC | PRN

## 2020-02-16 NOTE — Discharge Summary (Signed)
Physician Discharge Summary  Amber Mata ZHG:[id number] DOB: 17-Sep-1960 DOA: 02/12/2020  PCP: Berkley Harvey, NP  Admit date: 02/12/2020 Discharge date: 02/16/2020  Admitted From: Home Disposition: Home  Recommendations for Outpatient Follow-up:  1. Follow up with PCP in 1-2 weeks 2. Follow-up with surgery as scheduled. 3. Drain care and local wound care as instructed.  Home Health: Not applicable Equipment/Devices: Not applicable  Discharge Condition: Stable CODE STATUS: Full code Diet recommendation: Low-salt low-carb diet.  Discharge summary: Patient with history of diabetes, gastric bypass, hypertension, anxiety and recent Shilgella enterocolitis presented to [facility] with abdominal pain of about 2 days duration, nausea without vomiting.  Also associated loose stool.  Recently completed antibiotic therapy for shigella.  In the emergency room patient is afebrile, hypertensive.  On room air.  Right upper quadrant ultrasound with cholelithiasis without gallbladder wall thickening however positive Murphy sign.  Transferred to hospital.  LFTs normal.  MRI showed acute cholecystitis with 5 mm cystic duct stone and patent bile duct.  Admitted with acute calculus cholecystitis with cystic duct stone: Followed by GI and surgery.  Treated with IV fluid and antibiotics.  Underwent lap chole.  Stayed in the hospital due to ongoing pain.  She did good clinical recovery now.  Tolerating regular diet and having normal bowel function. As per surgical recommendation, patient is going home with oral pain medications.  Oral antibiotics to complete total 7 days of therapy. She is going home with right upper quadrant JP drain, instructions provided, surgery to schedule follow-up. Presentation LFTs were normal.  She had mildly elevated transaminases after surgical procedure they are already trending down.  WBC normalized.  Patient was prescribed a short course of tramadol for pain.  She  will resume all her long-term medications including blood pressure medications, benzodiazepines, SSRI and oral hypoglycemics.   Discharge Diagnoses:  Principal Problem:   RUQ pain Active Problems:   Depression   Essential hypertension   Controlled diabetes mellitus type II without complication (HCC)   Calculus of gallbladder and bile duct with obstruction without cholecystitis   Cystic duct calculus   Abnormal liver enzymes    Discharge Instructions  Discharge Instructions    Call MD for:  persistant nausea and vomiting   Complete by: As directed    Call MD for:  redness, tenderness, or signs of infection (pain, swelling, redness, odor or green/yellow discharge around incision site)   Complete by: As directed    Call MD for:  severe uncontrolled pain   Complete by: As directed    Call MD for:  temperature >100.4   Complete by: As directed    Diet - low sodium heart healthy   Complete by: As directed    Diet Carb Modified   Complete by: As directed    Discharge wound care:   Complete by: As directed    Keep drain insertion site intact and dry. Keep covered to avoid soiling .   Increase activity slowly   Complete by: As directed      Allergies as of 02/16/2020      Reactions   Bee Venom Anaphylaxis   Hctz [hydrochlorothiazide] Other (See Comments)   hyponatremia   Milk-related Compounds    Lactose Intolerant   Morphine And Related Other (See Comments)   Patient stated,"I get mean, angry and aggressive behavior."   Prednisone Other (See Comments)   Aggressive behavior   Zolpidem Other (See Comments)   Patient state,"they don't help me sleep."  Oxycodone Itching   Amlodipine Itching   Cantaloupe (diagnostic) Rash      Medication List    STOP taking these medications   folic acid 1 MG tablet Commonly known as: FOLVITE   progesterone 200 MG capsule Commonly known as: PROMETRIUM     TAKE these medications   ACCU-CHEK ACTIVE STRIPS test strip Generic drug:  glucose blood 1 strip by Misc.(Non-Drug; Combo Route) route daily.   acetaminophen 500 MG tablet Commonly known as: TYLENOL Take 500 mg by mouth every 6 (six) hours as needed for moderate pain.   amoxicillin-clavulanate 875-125 MG tablet Commonly known as: AUGMENTIN Take 1 tablet by mouth every 12 (twelve) hours for 5 days.   clonazePAM 1 MG tablet Commonly known as: KLONOPIN Take 1 mg by mouth daily as needed for anxiety.   CO Q 10 PO Take 1 tablet by mouth in the morning and at bedtime.   cyanocobalamin 1000 MCG/ML injection Commonly known as: (VITAMIN B-12) Inject 1,000 mcg into the muscle every 30 (thirty) days.   empagliflozin 10 MG Tabs tablet Commonly known as: JARDIANCE Take 10 mg by mouth daily.   EPINEPHrine 0.3 mg/0.3 mL Soaj injection Commonly known as: EpiPen Inject 0.3 mLs (0.3 mg total) into the muscle once.   escitalopram 10 MG tablet Commonly known as: LEXAPRO Take 10 mg by mouth daily.   Fifty50 Glucose Meter 2.0 w/Device Kit Use as instructed   metFORMIN 500 MG tablet Commonly known as: GLUCOPHAGE take 1 tablet by mouth once daily What changed:   how much to take  how to take this  when to take this  additional instructions   metoprolol tartrate 25 MG tablet Commonly known as: LOPRESSOR Take 25 mg by mouth 2 (two) times daily.   multivitamin with minerals Tabs tablet Take 1 tablet by mouth daily.   olmesartan 20 MG tablet Commonly known as: BENICAR Take 20 mg by mouth daily.   traMADol 50 MG tablet Commonly known as: ULTRAM Take 1 tablet (50 mg total) by mouth every 6 (six) hours as needed for moderate pain.   traZODone 50 MG tablet Commonly known as: DESYREL Take 50 mg by mouth 2 (two) times daily.   TUMS PO Take 1 tablet by mouth daily as needed (stomach issue).            Discharge Care Instructions  (From admission, onward)         Start     Ordered   02/16/20 0000  Discharge wound care:       Comments: Keep  drain insertion site intact and dry. Keep covered to avoid soiling .   02/16/20 1117          Follow-up Information    [facility], [facility] Follow up on 03/02/2020.   Specialty: General Surgery Why: Your appointment is at 11:45 AM.   Contact information: [address] [place] [address] [phone]              Allergies  Allergen Reactions  . Bee Venom Anaphylaxis  . Hctz [Hydrochlorothiazide] Other (See Comments)    hyponatremia  . Milk-Related Compounds     Lactose Intolerant  . Morphine And Related Other (See Comments)    Patient stated,"I get mean, angry and aggressive behavior."  . Prednisone Other (See Comments)    Aggressive behavior  . Zolpidem Other (See Comments)    Patient state,"they don't help me sleep."  . Oxycodone Itching  . Amlodipine Itching  .  Cantaloupe (Diagnostic) Rash    Consultations:  General surgery  Gastroenterology   Procedures/Studies: MR 3D Recon At Scanner  Result Date: 02/12/2020 CLINICAL DATA:  Right upper quadrant pain, cholelithiasis, common bile duct dilatation on prior ultrasound EXAM: MRI ABDOMEN WITHOUT AND WITH CONTRAST (INCLUDING MRCP) TECHNIQUE: Multiplanar multisequence MR imaging of the abdomen was performed both before and after the administration of intravenous contrast. Heavily T2-weighted images of the biliary and pancreatic ducts were obtained, and three-dimensional MRCP images were rendered by post processing. CONTRAST:  73m GADAVIST GADOBUTROL 1 MMOL/ML IV SOLN COMPARISON:  Same-day right upper quadrant ultrasound FINDINGS: Lower chest: No acute findings. Hepatobiliary: No mass or other parenchymal abnormality identified. Numerous gallstones in the distended gallbladder with mild gallbladder wall thickening and trace pericholecystic fluid. There appears to be a gallstone in the cystic duct measuring 5 mm (series 10, image 26, series 3, image 18). There is no dilatation of the common bile duct and  no filling defect to the ampulla. Pancreas: No mass, inflammatory changes, or other parenchymal abnormality identified. Spleen:  Within normal limits in size and appearance. Adrenals/Urinary Tract: No masses identified. No evidence of hydronephrosis. Stomach/Bowel: Visualized portions within the abdomen are unremarkable. Vascular/Lymphatic: No pathologically enlarged lymph nodes identified. No abdominal aortic aneurysm demonstrated. Other:  None. Musculoskeletal: No suspicious bone lesions identified. IMPRESSION: 1. Numerous gallstones in the distended gallbladder with mild gallbladder wall thickening and trace pericholecystic fluid. There appears to be a 5 mm gallstone in the cystic duct. Findings are concerning for acute cholecystitis. 2. There is no dilatation of the common bile duct and no filling defect to the ampulla. These results will be called to the ordering clinician or representative by the Radiologist Assistant, and communication documented in the PACS or [employer] Electronically Signed   By: AEddie CandleM.D.   On: 02/12/2020 21:55   MR ABDOMEN MRCP W WO CONTAST  Result Date: 02/12/2020 CLINICAL DATA:  Right upper quadrant pain, cholelithiasis, common bile duct dilatation on prior ultrasound EXAM: MRI ABDOMEN WITHOUT AND WITH CONTRAST (INCLUDING MRCP) TECHNIQUE: Multiplanar multisequence MR imaging of the abdomen was performed both before and after the administration of intravenous contrast. Heavily T2-weighted images of the biliary and pancreatic ducts were obtained, and three-dimensional MRCP images were rendered by post processing. CONTRAST:  940mGADAVIST GADOBUTROL 1 MMOL/ML IV SOLN COMPARISON:  Same-day right upper quadrant ultrasound FINDINGS: Lower chest: No acute findings. Hepatobiliary: No mass or other parenchymal abnormality identified. Numerous gallstones in the distended gallbladder with mild gallbladder wall thickening and trace pericholecystic fluid. There appears to be a  gallstone in the cystic duct measuring 5 mm (series 10, image 26, series 3, image 18). There is no dilatation of the common bile duct and no filling defect to the ampulla. Pancreas: No mass, inflammatory changes, or other parenchymal abnormality identified. Spleen:  Within normal limits in size and appearance. Adrenals/Urinary Tract: No masses identified. No evidence of hydronephrosis. Stomach/Bowel: Visualized portions within the abdomen are unremarkable. Vascular/Lymphatic: No pathologically enlarged lymph nodes identified. No abdominal aortic aneurysm demonstrated. Other:  None. Musculoskeletal: No suspicious bone lesions identified. IMPRESSION: 1. Numerous gallstones in the distended gallbladder with mild gallbladder wall thickening and trace pericholecystic fluid. There appears to be a 5 mm gallstone in the cystic duct. Findings are concerning for acute cholecystitis. 2. There is no dilatation of the common bile duct and no filling defect to the ampulla. These results will be called to the ordering clinician or representative by the Radiologist Assistant, and  communication documented in the PACS or [employer]. Electronically Signed   By: Eddie Candle M.D.   On: 02/12/2020 21:55   US Abdomen Limited RUQ (LIVER/GB)  Result Date: 02/12/2020 CLINICAL DATA:  Right upper quadrant abdominal pain. EXAM: ULTRASOUND ABDOMEN LIMITED RIGHT UPPER QUADRANT COMPARISON:  January 10, 2020. FINDINGS: Gallbladder: Cholelithiasis is noted with largest calculus measuring 6 mm. No significant gallbladder wall thickening or pericholecystic fluid is noted. However, positive sonographic Murphy's sign is noted. Common bile duct: Diameter: 11 mm which is concerning for distal common bile duct obstruction. Liver: No focal lesion identified. Within normal limits in parenchymal echogenicity. Portal vein is patent on color Doppler imaging with normal direction of blood flow towards the liver. Other: None. IMPRESSION: Cholelithiasis  is noted without gallbladder wall thickening or pericholecystic fluid, but positive sonographic Murphy's sign is noted. If there is clinical concern for cholecystitis, HIDA scan is recommended for further evaluation. Also noted is dilated common bile duct concerning for distal common bile duct obstruction. Correlation with liver function tests is recommended as well as possible MRCP. Electronically Signed   By: Marijo Conception M.D.   On: 02/12/2020 10:01   (Echo, Carotid, EGD, Colonoscopy, ERCP)    Subjective: Patient seen and examined.  She had one episode of spasmodic pain last night especially at the drain insertion site.  Remains afebrile.  Tolerating diet. Patient and family called me back into the room to discuss discharge planning.  She is ready to go home today.   Discharge Exam: Vitals:   02/15/20 2134 02/16/20 0608  BP: 114/63 118/72  Pulse: 72 65  Resp: 18 16  Temp: 97.9 F (36.6 C) 98.4 F (36.9 C)  SpO2: 95% 98%   Vitals:   02/15/20 0915 02/15/20 1826 02/15/20 2134 02/16/20 0608  BP: 106/72 111/61 114/63 118/72  Pulse: 84 66 72 65  Resp:  '16 18 16  ' Temp:  97.9 F (36.6 C) 97.9 F (36.6 C) 98.4 F (36.9 C)  TempSrc:   Oral   SpO2:  99% 95% 98%  Weight:      Height:        General: Pt is alert, awake, not in acute distress Walking around the hallway. Cardiovascular: RRR, S1/S2 +, no rubs, no gallops Respiratory: CTA bilaterally, no wheezing, no rhonchi Abdominal: Slight tenderness along the JP tube insertion site.  Drain with minimal serosanguineous discharge.  Bowel sounds present. Extremities: no edema, no cyanosis    The results of significant diagnostics from this hospitalization (including imaging, microbiology, ancillary and laboratory) are listed below for reference.     Microbiology: Recent Results (from the past 240 hour(s))  Respiratory Panel by RT PCR (Flu A&B, Covid) - Nasopharyngeal Swab     Status: None   Collection Time: 02/12/20 11:00 AM    Specimen: Nasopharyngeal Swab  Result Value Ref Range Status   SARS Coronavirus 2 by RT PCR NEGATIVE NEGATIVE Final    Comment: (NOTE) SARS-CoV-2 target nucleic acids are NOT DETECTED.  The SARS-CoV-2 RNA is generally detectable in upper respiratoy specimens during the acute phase of infection. The lowest concentration of SARS-CoV-2 viral copies this assay can detect is 131 copies/mL. A negative result does not preclude SARS-Cov-2 infection and should not be used as the sole basis for treatment or other patient management decisions. A negative result may occur with  improper specimen collection/handling, submission of specimen other than nasopharyngeal swab, presence of viral mutation(s) within the areas targeted by this assay, and inadequate number  of viral copies (<131 copies/mL). A negative result must be combined with clinical observations, patient history, and epidemiological information. The expected result is Negative.  Fact Sheet for Patients:  PinkCheek.be  Fact Sheet for Healthcare Providers:  GravelBags.it  This test is no t yet approved or cleared by the [place] FDA and  has been authorized for detection and/or diagnosis of SARS-CoV-2 by FDA under an Emergency Use Authorization (EUA). This EUA will remain  in effect (meaning this test can be used) for the duration of the COVID-19 declaration under Section 564(b)(1) of the Act, 21 U.S.C. section 360bbb-3(b)(1), unless the authorization is terminated or revoked sooner.     Influenza A by PCR NEGATIVE NEGATIVE Final   Influenza B by PCR NEGATIVE NEGATIVE Final    Comment: (NOTE) The Xpert Xpress SARS-CoV-2/FLU/RSV assay is intended as an aid in  the diagnosis of influenza from Nasopharyngeal swab specimens and  should not be used as a sole basis for treatment. Nasal washings and  aspirates are unacceptable for Xpert Xpress SARS-CoV-2/FLU/RSV   testing.  Fact Sheet for Patients: PinkCheek.be  Fact Sheet for Healthcare Providers: GravelBags.it  This test is not yet approved or cleared by the [place] FDA and  has been authorized for detection and/or diagnosis of SARS-CoV-2 by  FDA under an Emergency Use Authorization (EUA). This EUA will remain  in effect (meaning this test can be used) for the duration of the  Covid-19 declaration under Section 564(b)(1) of the Act, 21  U.S.C. section 360bbb-3(b)(1), unless the authorization is  terminated or revoked. Performed at [facility], [address] [place], [place] [address]   Surgical pcr screen     Status: None   Collection Time: 02/13/20 12:24 PM   Specimen: Nasal Mucosa; Nasal Swab  Result Value Ref Range Status   MRSA, PCR NEGATIVE NEGATIVE Final   Staphylococcus aureus NEGATIVE NEGATIVE Final    Comment: (NOTE) The Xpert SA Assay (FDA approved for NASAL specimens in patients 53 years of age and older), is one component of a comprehensive surveillance program. It is not intended to diagnose infection nor to guide or monitor treatment. Performed at [facility], [place] [address], [place], [place] [address]      Labs: BNP (last 3 results) No results for input(s): BNP in the last 8760 hours. Basic Metabolic Panel: Recent Labs  Lab 02/12/20 0837 02/12/20 0837 02/12/20 1827 02/13/20 0326 02/14/20 0345 02/15/20 0724 02/16/20 0334  NA 132*  --   --  135 134* 131* 132*  K 3.8  --   --  4.3 5.1 4.0 3.6  CL 96*  --   --  99 99 96* 97*  CO2 24  --   --  '26 26 26 25  ' GLUCOSE 128*  --   --  113* 129* 166* 136*  BUN 7  --   --  '8 13 13 13  ' CREATININE 0.63   < > 0.74 0.69 0.70 0.68 0.55  CALCIUM 8.6*  --   --  8.5* 8.2* 7.7* 7.7*   < > = values in this interval not displayed.   Liver Function Tests: Recent Labs  Lab 02/12/20 0837 02/13/20 0326 02/14/20 0345  02/15/20 0724 02/16/20 0334  AST 17 15 90* 232* 117*  ALT 18 18 53* 173* 128*  ALKPHOS 53 56 52 147* 131*  BILITOT 0.5 1.0 1.1 1.5* 1.4*  PROT 6.5 6.7 6.3* 5.6* 5.3*  ALBUMIN 3.5 3.4* 3.3* 2.9*  2.6*   Recent Labs  Lab 02/12/20 0837  LIPASE 25   No results for input(s): AMMONIA in the last 168 hours. CBC: Recent Labs  Lab 02/12/20 0837 02/12/20 1827 02/13/20 0326 02/14/20 0345 02/15/20 0724  WBC 13.2* 11.3* 11.5* 13.6* 5.9  NEUTROABS 10.0*  --   --   --  4.2  HGB 15.1* 15.1* 14.5 13.0 11.8*  HCT 44.2 45.7 43.8 39.8 35.8*  MCV 93.4 97.0 97.6 98.0 97.3  PLT 205 179 163 162 169   Cardiac Enzymes: No results for input(s): CKTOTAL, CKMB, CKMBINDEX, TROPONINI in the last 168 hours. BNP: Invalid input(s): POCBNP CBG: Recent Labs  Lab 02/15/20 1148 02/15/20 1334 02/15/20 1726 02/15/20 2213 02/16/20 0736  GLUCAP 128* 132* 149* 155* 127*   D-Dimer No results for input(s): DDIMER in the last 72 hours. Hgb A1c No results for input(s): HGBA1C in the last 72 hours. Lipid Profile No results for input(s): CHOL, HDL, LDLCALC, TRIG, CHOLHDL, LDLDIRECT in the last 72 hours. Thyroid function studies No results for input(s): TSH, T4TOTAL, T3FREE, THYROIDAB in the last 72 hours.  Invalid input(s): FREET3 Anemia work up No results for input(s): VITAMINB12, FOLATE, FERRITIN, TIBC, IRON, RETICCTPCT in the last 72 hours. Urinalysis    Component Value Date/Time   COLORURINE YELLOW 02/12/2020 [place] 02/12/2020 0837   LABSPEC 1.015 02/12/2020 0837   PHURINE 6.0 02/12/2020 0837   GLUCOSEU >=500 (A) 02/12/2020 0837   HGBUR SMALL (A) 02/12/2020 0837   HGBUR trace-lysed 05/30/2010 0905   BILIRUBINUR NEGATIVE 02/12/2020 0837   BILIRUBINUR neg 12/23/2013 1543   KETONESUR NEGATIVE 02/12/2020 0837   PROTEINUR NEGATIVE 02/12/2020 0837   UROBILINOGEN 0.2 03/04/2014 0223   NITRITE NEGATIVE 02/12/2020 0837   LEUKOCYTESUR NEGATIVE 02/12/2020 0837   Sepsis  Labs Invalid input(s): PROCALCITONIN,  WBC,  LACTICIDVEN Microbiology Recent Results (from the past 240 hour(s))  Respiratory Panel by RT PCR (Flu A&B, Covid) - Nasopharyngeal Swab     Status: None   Collection Time: 02/12/20 11:00 AM   Specimen: Nasopharyngeal Swab  Result Value Ref Range Status   SARS Coronavirus 2 by RT PCR NEGATIVE NEGATIVE Final    Comment: (NOTE) SARS-CoV-2 target nucleic acids are NOT DETECTED.  The SARS-CoV-2 RNA is generally detectable in upper respiratoy specimens during the acute phase of infection. The lowest concentration of SARS-CoV-2 viral copies this assay can detect is 131 copies/mL. A negative result does not preclude SARS-Cov-2 infection and should not be used as the sole basis for treatment or other patient management decisions. A negative result may occur with  improper specimen collection/handling, submission of specimen other than nasopharyngeal swab, presence of viral mutation(s) within the areas targeted by this assay, and inadequate number of viral copies (<131 copies/mL). A negative result must be combined with clinical observations, patient history, and epidemiological information. The expected result is Negative.  Fact Sheet for Patients:  PinkCheek.be  Fact Sheet for Healthcare Providers:  GravelBags.it  This test is no t yet approved or cleared by the [place] FDA and  has been authorized for detection and/or diagnosis of SARS-CoV-2 by FDA under an Emergency Use Authorization (EUA). This EUA will remain  in effect (meaning this test can be used) for the duration of the COVID-19 declaration under Section 564(b)(1) of the Act, 21 U.S.C. section 360bbb-3(b)(1), unless the authorization is terminated or revoked sooner.     Influenza A by PCR NEGATIVE NEGATIVE Final   Influenza B by PCR NEGATIVE NEGATIVE Final  Comment: (NOTE) The Xpert Xpress SARS-CoV-2/FLU/RSV assay  is intended as an aid in  the diagnosis of influenza from Nasopharyngeal swab specimens and  should not be used as a sole basis for treatment. Nasal washings and  aspirates are unacceptable for Xpert Xpress SARS-CoV-2/FLU/RSV  testing.  Fact Sheet for Patients: PinkCheek.be  Fact Sheet for Healthcare Providers: GravelBags.it  This test is not yet approved or cleared by the [place] FDA and  has been authorized for detection and/or diagnosis of SARS-CoV-2 by  FDA under an Emergency Use Authorization (EUA). This EUA will remain  in effect (meaning this test can be used) for the duration of the  Covid-19 declaration under Section 564(b)(1) of the Act, 21  U.S.C. section 360bbb-3(b)(1), unless the authorization is  terminated or revoked. Performed at [facility], [address] [place], [place] [address]   Surgical pcr screen     Status: None   Collection Time: 02/13/20 12:24 PM   Specimen: Nasal Mucosa; Nasal Swab  Result Value Ref Range Status   MRSA, PCR NEGATIVE NEGATIVE Final   Staphylococcus aureus NEGATIVE NEGATIVE Final    Comment: (NOTE) The Xpert SA Assay (FDA approved for NASAL specimens in patients 4 years of age and older), is one component of a comprehensive surveillance program. It is not intended to diagnose infection nor to guide or monitor treatment. Performed at [facility], [place] [address], [place], [place] [address]      Time coordinating discharge: 40 minutes  SIGNED:   Barb Merino, MD  [facility] 02/16/2020, 1:36 PM

## 2020-02-16 NOTE — Progress Notes (Signed)
    3 Days Post-Op  Subjective: Doing ok this morning, but woke up last night in tremendous pain and vomited secondary to this.  No further vomiting and not related to food she says.  Pain has come under control since that time.    ROS: See above, otherwise other systems negative  Objective: Vital signs in last 24 hours: Temp:  [97.9 F (36.6 C)-98.4 F (36.9 C)] 98.4 F (36.9 C) (11/15 0608) Pulse Rate:  [65-72] 65 (11/15 0608) Resp:  [16-18] 16 (11/15 0608) BP: (111-118)/(61-72) 118/72 (11/15 0608) SpO2:  [95 %-99 %] 98 % (11/15 1610) Last BM Date: 02/11/20  Intake/Output from previous day: 11/14 0701 - 11/15 0700 In: 1267 [P.O.:1160; IV Piggyback:107] Out: 1515 [Urine:1500; Drains:15] Intake/Output this shift: Total I/O In: -  Out: 10 [Drains:10]  PE: Abd: soft, appropriately tender, drain just emptied but with minimal thin serosang drainage noted, incisions c/d/i  Lab Results:  Recent Labs    02/14/20 0345 02/15/20 0724  WBC 13.6* 5.9  HGB 13.0 11.8*  HCT 39.8 35.8*  PLT 162 169   BMET Recent Labs    02/15/20 0724 02/16/20 0334  NA 131* 132*  K 4.0 3.6  CL 96* 97*  CO2 26 25  GLUCOSE 166* 136*  BUN 13 13  CREATININE 0.68 0.55  CALCIUM 7.7* 7.7*   PT/INR No results for input(s): LABPROT, INR in the last 72 hours. CMP     Component Value Date/Time   NA 132 (L) 02/16/2020 0334   K 3.6 02/16/2020 0334   CL 97 (L) 02/16/2020 0334   CO2 25 02/16/2020 0334   GLUCOSE 136 (H) 02/16/2020 0334   BUN 13 02/16/2020 0334   CREATININE 0.55 02/16/2020 0334   CREATININE 0.83 07/17/2018 1048   CREATININE 0.67 03/03/2014 1137   CALCIUM 7.7 (L) 02/16/2020 0334   PROT 5.3 (L) 02/16/2020 0334   ALBUMIN 2.6 (L) 02/16/2020 0334   AST 117 (H) 02/16/2020 0334   AST 17 07/17/2018 1048   ALT 128 (H) 02/16/2020 0334   ALT 24 07/17/2018 1048   ALKPHOS 131 (H) 02/16/2020 0334   BILITOT 1.4 (H) 02/16/2020 0334   BILITOT 0.4 07/17/2018 1048   GFRNONAA >60 02/16/2020  0334   GFRNONAA >60 07/17/2018 1048   GFRAA >60 07/17/2018 1048   Lipase     Component Value Date/Time   LIPASE 25 02/12/2020 0837       Studies/Results: No results found.  Anti-infectives: Anti-infectives (From admission, onward)   Start     Dose/Rate Route Frequency Ordered Stop   02/13/20 1030  piperacillin-tazobactam (ZOSYN) IVPB 3.375 g        3.375 g 12.5 mL/hr over 240 Minutes Intravenous Every 8 hours 02/13/20 0943         Assessment/Plan POD 3, s/p lap chole with endoloop by Dr. Lucia Gaskins 11/13 -LFTs still trending down, check in am -pain control -cont JP drain, will likely need to go home with this -5 days of abx therapy post op -mobilize/pulm toilet -plan for DC home tomorrow if pain better controlled today  FEN - carb mod diet VTE - lovenox ID - zosyn, converted to oral augmentin today   LOS: 4 days    Amber Mata , [facility] 02/16/2020, 9:17 AM Please see Amion for pager number during day hours 7:00am-4:30pm or 7:00am -11:30am on weekends

## 2020-02-16 NOTE — Plan of Care (Signed)

## 2020-02-16 NOTE — Anesthesia Postprocedure Evaluation (Signed)
Anesthesia Post Note  Patient: Amber Mata  Procedure(s) Performed: LAPAROSCOPIC CHOLECYSTECTOMY (N/A Abdomen)     Patient location during evaluation: PACU Anesthesia Type: General Level of consciousness: awake and alert Pain management: pain level controlled Vital Signs Assessment: post-procedure vital signs reviewed and stable Respiratory status: spontaneous breathing, nonlabored ventilation, respiratory function stable and patient connected to nasal cannula oxygen Cardiovascular status: blood pressure returned to baseline and stable Postop Assessment: no apparent nausea or vomiting Anesthetic complications: no   No complications documented.  Last Vitals:  Vitals:   02/15/20 2134 02/16/20 0608  BP: 114/63 118/72  Pulse: 72 65  Resp: 18 16  Temp: 36.6 C 36.9 C  SpO2: 95% 98%    Last Pain:  Vitals:   02/16/20 0601  TempSrc:   PainSc: Asleep                 Lloyde Ludlam

## 2020-02-17 LAB — SURGICAL PATHOLOGY

## 2020-08-05 ENCOUNTER — Encounter: Payer: Self-pay | Admitting: Physical Therapy

## 2020-08-05 ENCOUNTER — Ambulatory Visit: Payer: Managed Care, Other (non HMO) | Attending: Nurse Practitioner | Admitting: Physical Therapy

## 2020-08-05 ENCOUNTER — Other Ambulatory Visit: Payer: Self-pay

## 2020-08-05 DIAGNOSIS — M25512 Pain in left shoulder: Secondary | ICD-10-CM | POA: Diagnosis not present

## 2020-08-05 DIAGNOSIS — M6281 Muscle weakness (generalized): Secondary | ICD-10-CM | POA: Diagnosis present

## 2020-08-05 DIAGNOSIS — M25612 Stiffness of left shoulder, not elsewhere classified: Secondary | ICD-10-CM | POA: Diagnosis present

## 2020-08-05 DIAGNOSIS — R252 Cramp and spasm: Secondary | ICD-10-CM | POA: Insufficient documentation

## 2020-08-05 NOTE — Patient Instructions (Signed)
Access Code: [email or web address] URL: [email or web address] Date: 08/05/2020 Prepared by: Amador Cunas  Exercises Seated Scapular Retraction - 1 x daily - 7 x weekly - 2 sets - 10 reps - 3 sec hold Standing Shoulder External Rotation with Resistance - 1 x daily - 7 x weekly - 2 sets - 10 reps Shoulder Internal Rotation with Resistance - 1 x daily - 7 x weekly - 2 sets - 10 reps Seated Upper Trapezius Stretch - 1 x daily - 7 x weekly - 2 sets - 2 reps - 15-20 sec hold Seated Levator Scapulae Stretch - 1 x daily - 7 x weekly - 2 sets - 2 reps - 15-20 sec hold

## 2020-08-05 NOTE — Therapy (Signed)
[place]. [place], [place], [address] Phone: [phone]   Fax:  [phone]  Physical Therapy Evaluation  Patient Details  Name: Amber Mata MRN: [id number] Date of Birth: 1960/12/25 Referring Provider (PT): Ronnald Ramp   Encounter Date: 08/05/2020   PT End of Session - 08/05/20 1145    Visit Number 1    Date for PT Re-Evaluation 10/28/20    PT Start Time 1102    PT Stop Time 1144    PT Time Calculation (min) 42 min    Activity Tolerance Patient tolerated treatment well    Behavior During Therapy [facility] for tasks assessed/performed           Past Medical History:  Diagnosis Date  . Asthma    as a child  . Depression   . Diabetes mellitus   . Glaucoma   . Hypertension   . Insomnia   . Neuropathy    fibromyalgia  . Pneumothorax    secondary to pneumonia  . Sleep apnea     Past Surgical History:  Procedure Laterality Date  . ABDOMINOPLASTY  2007  . CHOLECYSTECTOMY N/A 02/13/2020   Procedure: LAPAROSCOPIC CHOLECYSTECTOMY;  Surgeon: Alphonsa Overall, MD;  Location: [facility];  Service: General;  Laterality: N/A;  . KNEE SURGERY     left  . Mini gastric bypass  2006  . TONSILLECTOMY    . TUBAL LIGATION      There were no vitals filed for this visit.    Subjective Assessment - 08/05/20 1104    Subjective Pt reports L shoulder pain starting 2 weeks ago; she is allergic to bees and was trying to swat away bees and felt pain in posterior L shoulder. This past weekend she had to push up off L shoulder to get up and felt sharp pain again. Pt states that she is also having trouble reaching behind her to buckle seat belt. Pt is R handed and has been avoiding using L hand d/t pain. Pt works for [employer] from home on computer and is having trouble typing on computer no matter which way she adjusts it. Pt states she is having trouble washing hair with L hand. Pt states she is getting occasional N/T in L hand especially  when sitting and typing.    Pertinent History DM, asthma    Limitations Lifting;House hold activities    Diagnostic tests none for L shoulder    Patient Stated Goals get back to PLOF    Currently in Pain? Yes    Pain Score 6     Pain Location Shoulder    Pain Orientation Left    Pain Descriptors / Indicators Aching;Dull;Sharp    Pain Type Acute pain    Pain Radiating Towards LUE; states occasional numbness    Pain Onset 1 to 4 weeks ago    Pain Frequency Intermittent    Aggravating Factors  reaching behind head, reaching behind back, lifting items    Pain Relieving Factors tramadol, ice/heat              [facility] PT Assessment - 08/05/20 0001      Assessment   Medical Diagnosis L shoulder pain    Referring Provider (PT) Ronnald Ramp    Onset Date/Surgical Date --   two weeks ago   Hand Dominance Right    Next MD Visit --   3 months   Prior Therapy PT after knee surgery      Precautions  Precautions None      Restrictions   Weight Bearing Restrictions No      Balance Screen   Has the patient fallen in the past 6 months Yes    How many times? 4   catches L foot on objects and falls   Has the patient had a decrease in activity level because of a fear of falling?  No    Is the patient reluctant to leave their home because of a fear of falling?  No      Home Environment   Additional Comments has large garden and does a lot of yardwork      Prior Function   Level of Independence Independent    Vocation Full time employment    Vocation Requirements works from home for [employer] on [employer], walking      Sensation   Light Touch Appears Intact      Posture/Postural Control   Posture/Postural Control Postural limitations    Postural Limitations Rounded Shoulders;Forward head      ROM / Strength   AROM / PROM / Strength AROM;Strength      AROM   AROM Assessment Site Shoulder    Right/Left Shoulder Left    Left Shoulder Flexion --   WFL but slow  movement   Left Shoulder ABduction 150 Degrees    Left Shoulder Internal Rotation --   to T12 + tightness   Left Shoulder External Rotation --   to C7 no pain     Strength   Strength Assessment Site Shoulder;Elbow    Right/Left Shoulder Right;Left    Right Shoulder Flexion 5/5    Right Shoulder ABduction 5/5    Right Shoulder Internal Rotation 5/5    Right Shoulder External Rotation 5/5    Left Shoulder Flexion 5/5    Left Shoulder ABduction 3+/5   sharp pain with MMT   Left Shoulder Internal Rotation 4+/5    Left Shoulder External Rotation 4+/5    Right/Left Elbow Right;Left    Right Elbow Flexion 5/5    Right Elbow Extension 5/5    Left Elbow Flexion 5/5    Left Elbow Extension 4-/5   pain posterior L shoulder     Palpation   Palpation comment very tender to palpation L UT, L post shoulder, L teres minor      Special Tests    Special Tests Rotator Cuff Impingement    Rotator Cuff Impingment tests Neer impingement test;Hawkins- Kennedy test;Empty Can test;Speed's test      Neer Impingement test    Findings Positive    Side Left      Hawkins-Kennedy test   Findings Positive    Side Left      Empty Can test   Findings Positive    Side Left      Speed's test   Findings Negative    Side Left                      Objective measurements completed on examination: See above findings.       [facility] Adult PT Treatment/Exercise - 08/05/20 0001      Exercises   Exercises Shoulder      Shoulder Exercises: Seated   Retraction Both;10 reps    External Rotation Both;10 reps    Theraband Level (Shoulder External Rotation) Level 2 (Red)    Internal Rotation Left;10 reps    Theraband Level (Shoulder Internal Rotation) Level  2 (Red)      Shoulder Exercises: Stretch   Other Shoulder Stretches UT and levator stretch x20 sec each                  PT Education - 08/05/20 1145    Education Details Pt educated on POC and HEP    Person(s) Educated  Patient    Methods Explanation;Demonstration;Handout    Comprehension Verbalized understanding;Returned demonstration            PT Short Term Goals - 08/05/20 1208      PT SHORT TERM GOAL #1   Title Pt will report I with initial HEP    Time 2    Period Weeks    Status New    Target Date 08/19/20             PT Long Term Goals - 08/05/20 1209      PT LONG TERM GOAL #1   Title Pt will be I with advanced HEP    Time 6    Period Weeks    Status New    Target Date 09/16/20      PT LONG TERM GOAL #2   Title Pt will demo L shoulder abduction WFL with no increase in L shoulder pain    Time 6    Period Weeks    Status New    Target Date 09/16/20      PT LONG TERM GOAL #3   Title Pt will demo L shoulder abduction MMT 5/5 with no increase in L shoulder pain    Time 6    Period Weeks    Status New    Target Date 09/16/20      PT LONG TERM GOAL #4   Title Pt will report able to complete full day of work at desk with no increase in L shoulder pain or LUE N/T    Time 6    Period Weeks    Status New    Target Date 09/16/20      PT LONG TERM GOAL #5   Title Pt will report 50% reduction in L shoulder pain    Time 6    Period Weeks    Status New    Target Date 09/16/20                  Plan - 08/05/20 1153    Clinical Impression Statement Pt presents to clinic with acute L shoulder pain which began after swatting at bees a few weeks ago and worsened with pushing up to stand with L shoulder. Pt demos positive impingment testing, reports of nerve pain down LUE with palpation to L UT, very tender to palpation L post shoulder/UT, and pain with L shoulder abduction. Pt demos sharp pain with L shoulder abduction MMT. Potential for RC involvement. Pt has not had any imaging yet; will progress to gentle TE/manual therapy within painfree ranges. Pt would benefit from skilled PT to address the above impairments.    Personal Factors and Comorbidities Comorbidity 2     Comorbidities DM, asthma    Examination-Activity Limitations Reach Overhead;Lift    Examination-Participation Restrictions Occupation;Community Activity;Interpersonal Relationship    Stability/Clinical Decision Making Evolving/Moderate complexity    Clinical Decision Making Low    Rehab Potential Good    PT Frequency 2x / week    PT Duration 6 weeks    PT Treatment/Interventions ADLs/Self Care Home Management;Electrical Stimulation;Iontophoresis 4mg /ml Dexamethasone;Moist Heat;Neuromuscular re-education;Therapeutic exercise;Therapeutic activities;Patient/family education;Manual techniques;Dry needling;Passive  range of motion    PT Next Visit Plan gentle progression to TE, STM to L UT/shoulder girdle, manual/modalities as indicated, review/progress HEP    PT Home Exercise Plan see pt instructions    Consulted and Agree with Plan of Care Patient           Patient will benefit from skilled therapeutic intervention in order to improve the following deficits and impairments:  Decreased range of motion,Increased muscle spasms,Impaired UE functional use,Pain,Hypomobility,Postural dysfunction  Visit Diagnosis: Acute pain of left shoulder  Stiffness of left shoulder, not elsewhere classified  Cramp and spasm  Muscle weakness (generalized)     Problem List Patient Active Problem List   Diagnosis Date Noted  . Abnormal liver enzymes   . Cystic duct calculus   . RUQ pain 02/12/2020  . Calculus of gallbladder and bile duct with obstruction without cholecystitis   . Excessive daytime sleepiness 06/21/2018  . 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 Abnormal dreams 06/21/2018  . IDA (iron deficiency anemia) 06/05/2018  . OSA (obstructive sleep apnea) 01/19/2016  . Tobacco abuse 03/30/2014  . Drug overdose 03/04/2014  . Normocytic anemia 03/04/2014  . 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 Controlled diabetes mellitus type II without complication ([place]) 96/78/9381  . MENOPAUSAL SYNDROME  03/10/2010  . PAIN IN JOINT, MULTIPLE SITES 12/28/2009  . MYALGIA 12/28/2009  . Essential hypertension 12/14/2009  . Chronic nonalcoholic liver disease 01/75/1025  . UTI'S, HX OF 12/03/2009  . INSOMNIA 11/08/2009  . DYSPHAGIA UNSPECIFIED 11/08/2009  . Depression 01/08/2009  . URINARY INCONTINENCE 01/08/2009   Amber Mata, PT, DPT Donald Prose Amber Mata 08/05/2020, 12:12 PM  [place]. [place], [place], [address] Phone: [phone]   Fax:  [phone]  Name: Amber Mata MRN: [id number] Date of Birth: 1960-04-11

## 2020-08-13 ENCOUNTER — Ambulatory Visit: Payer: Managed Care, Other (non HMO) | Admitting: Physical Therapy

## 2020-08-17 ENCOUNTER — Ambulatory Visit: Payer: Managed Care, Other (non HMO) | Admitting: Physical Therapy

## 2020-08-19 ENCOUNTER — Ambulatory Visit: Payer: Managed Care, Other (non HMO) | Admitting: Physical Therapy

## 2020-08-24 ENCOUNTER — Encounter: Payer: Managed Care, Other (non HMO) | Admitting: Physical Therapy

## 2020-08-26 ENCOUNTER — Encounter: Payer: Managed Care, Other (non HMO) | Admitting: Physical Therapy

## 2020-08-31 ENCOUNTER — Encounter: Payer: Managed Care, Other (non HMO) | Admitting: Physical Therapy

## 2020-09-02 ENCOUNTER — Encounter: Payer: Managed Care, Other (non HMO) | Admitting: Physical Therapy

## 2020-09-16 ENCOUNTER — Ambulatory Visit (HOSPITAL_BASED_OUTPATIENT_CLINIC_OR_DEPARTMENT_OTHER): Payer: 59 | Admitting: Obstetrics & Gynecology

## 2024-01-22 ENCOUNTER — Encounter: Payer: Self-pay | Admitting: Family

## 2024-01-23 ENCOUNTER — Other Ambulatory Visit: Payer: Self-pay

## 2024-01-23 ENCOUNTER — Encounter (HOSPITAL_COMMUNITY): Payer: Self-pay

## 2024-01-23 NOTE — Op Note (Signed)
 01/24/2024  8:17 PM  PATIENT:  Amber Mata    PRE-OPERATIVE DIAGNOSIS:  Other closed displaced fracture of proximal end of left humerus, initial encounter  POST-OPERATIVE DIAGNOSIS:  Same  PROCEDURE:  OPEN REDUCTION INTERNAL FIXATION (ORIF) PROXIMAL HUMERUS FRACTURE  SURGEON:  Rankin LELON Pizza, MD  SURGICAL ASSISTANT: ***  INDICATIONS FOR SURGICAL ASSISTANTS: During the operation, the services of a qualified surgical assistant were medically necessary and indicated to provide adequate surgical exposure and necessary to maintain the limb in the proper position during the procedure to carry out the operation safely and effectively.  With the qualified assistant being present, we would have extended up the operative procedure and made the procedure more technically difficult to perform.   ANESTHESIA:   General  PREOPERATIVE INDICATIONS:  Amber Mata is a  63 y.o. female with a diagnosis of Other closed displaced fracture of proximal end of left humerus, initial encounter who elected for surgical management.  The patient was indicated to undergo ORIF of the proximal humerus fracture due to the significant displacement of the tuberosities and angulation of the joint surface.  The risks benefits and alternatives were discussed with the patient including but not limited to the risks of nonoperative treatment, versus surgical intervention including infection, bleeding, nerve injury, malunion, nonunion, the need for revision surgery, hardware prominence, hardware failure, the need for hardware removal, blood clots, cardiopulmonary complications, conversion to arthroplasty, morbidity, mortality, among others, and they were willing to proceed.  Predicted outcome is good, although there will be at least a six to nine month expected recovery. The operative extremity was examined preoperatively and was found to have intact *** motor and sensation to axillary, median, radial and ulnar nerve  distributions with 2+ radial pulse. Final opportunity was given to the patient to ask additional questions and answers were given to their satisfaction  OPERATIVE IMPLANTS: Synthes 3.5 *** hole proximal humerus locking plate.  OPERATIVE FINDINGS: Displaced proximal humerus fracture.  UNIQUE ASPECTS OF THE CASE:  ***  Estimated Blood Loss: ***  OPERATIVE PROCEDURE: The patient was brought to the operating room and placed in the supine position. General anesthesia was administered. IV antibiotics were given. She was placed in the beach chair position. All bony prominences were padded. The upper extremity was prepped and draped in usual sterile fashion. A time out was performed confirming operative extremity, planned procedure, the patient's name and identifying information, surgeon's initials on the operative extremity, pre-operative antibiotics and TXA were given, implants are available and presence of C-arm.  All present agreed.  Deltopectoral incision was performed. I exposed the fracture site, and placed deep retractors. I did not tenotomize the biceps tendon. This was left in place. I elevated a small portion of the deltoid off of the shaft, in order to gain access for the plate. I placed supraspinatus and subscapularis stitches, and then reduced the head onto the shaft. This was maintained in satisfactory position.  I applied the plate and secured it into the sliding hole first. I confirmed position of the reduction and the plate with C-arm, and I placed a total of *** guidewires into the appropriate position in the head. I was satisfied that the plate was distal appropriately, and then secured the plate proximally with *** locking screws, taking care to prevent penetration into the arch articular surface, using C-arm, as well as manual feel using a hand drill. A *** calcar screw was used with good purchase into the inferomedial humeral head  I then  secured the plate distally using another  cortical screw. # 2 FiberWire sutures were then passed from the subscapularis and supraspinatus through the plate and tied securing the tuberosities. Once complete fixation and reduction of been achieved, took final C-arm pictures, and irrigated the wounds copiously, and repaired the deltopectoral interval with 0 Vicryl followed by 2-0 Vicryl for the subcutaneous tissue with 4-0  Monocryl and Steri-Strips for the skin. She was placed in a sling. She had a preoperative regional block as well. She tolerated the procedure well with no complications. The patient was awakened per anesthesia protocol, transferred to the stretcher and taken back to the PACU in stable condition.  Post operative instructions: The patient is to be non weight bearing to the operative extremity in a sling. The patient may come out of the sling for elbow and wrist range of motion and pendulum swings of the shoulder. No lifting with the operative extremity.  The patient will return to clinic 2 weeks post op for skin check and repeat x-rays of the operative shoulder.

## 2024-01-23 NOTE — H&P (Signed)
 ORTHOPAEDIC H&P  REQUESTING PHYSICIAN: Teresa Rankin ORN, MD  PCP:  Joshua Santana CROME, NP  Chief Complaint: Left comminuted proximal humerus fracture  HPI: Amber Mata is a 63 y.o. female who presents for ORIF of the left proximal humerus fracture s/p fall 01/07/2024. The patient was seen in the office and determined to have a fracture that is indicated for ORIF of the left proximal humerus due to the significant displacement of the tuberosities and angulation of the articular surface. Discussion was had with the patient that her history of gastric bypass, active tobacco use and diabetes mellitus all increase her risk of complication. She understands and wishes to proceed. No interval changes in the patient's history and the patient has been NPO since midnight.  Past Medical History:  Diagnosis Date   Asthma    as a child   Depression    Diabetes mellitus    Glaucoma    History of iron deficiency anemia    Hypertension    Insomnia    Neuropathy    fibromyalgia   Pneumothorax    secondary to pneumonia   Sleep apnea    Past Surgical History:  Procedure Laterality Date   ABDOMINOPLASTY  2007   CHOLECYSTECTOMY N/A 02/13/2020   Procedure: LAPAROSCOPIC CHOLECYSTECTOMY;  Surgeon: Ethyl Lenis, MD;  Location: [facility];  Service: General;  Laterality: N/A;   KNEE SURGERY     left   Mini gastric bypass  2006   TONSILLECTOMY     TUBAL LIGATION     Social History   Socioeconomic History   Marital status: Married    Spouse name: Not on file   Number of children: Not on file   Years of education: Not on file   Highest education level: Not on file  Occupational History   Not on file  Tobacco Use   Smoking status: Every Day    Current packs/day: 0.50    Types: Cigarettes   Smokeless tobacco: Never  Vaping Use   Vaping status: Never Used  Substance and Sexual Activity   Alcohol use: Yes    Alcohol/week: 5.0 standard drinks of alcohol    Types: 5 Glasses of wine per week    Drug use: No   Sexual activity: Yes    Partners: Male    Birth control/protection: Surgical    Comment: BTL  Other Topics Concern   Not on file  Social History Narrative   Not on file   Social Drivers of Health   Financial Resource Strain: Not on file  Food Insecurity: Not on file  Transportation Needs: Not on file  Physical Activity: Not on file  Stress: Not on file  Social Connections: Not on file   Family History  Problem Relation Age of Onset   Arthritis Other    Diabetes Father    Heart attack Father    Diabetes Paternal Grandfather    Hyperlipidemia Other    Hypertension Other    Stroke Other    Heart disease Other    Heart attack Paternal Grandmother    Stroke Mother    Hypertension Mother    Melanoma Mother    Allergies  Allergen Reactions   Bee Venom Anaphylaxis   Hctz [Hydrochlorothiazide ] Other (See Comments)    hyponatremia   Milk-Related Compounds     Lactose Intolerant   Morphine And Codeine Other (See Comments)    Patient stated,I get mean, angry and aggressive behavior.   Prednisone Other (See Comments)  Aggressive behavior   Zolpidem  Other (See Comments)    Patient state,they don't help me sleep.   Oxycodone  Itching   Amlodipine Itching   Cantaloupe (Diagnostic) Rash   Prior to Admission medications   Medication Sig Start Date End Date Taking? Authorizing Provider  acetaminophen  (TYLENOL ) 500 MG tablet Take 500 mg by mouth every 6 (six) hours as needed for moderate pain.   Yes [provider]  Calcium Carbonate Antacid (TUMS PO) Take 1 tablet by mouth daily as needed (stomach issue).    Yes [provider]  clonazePAM  (KLONOPIN ) 1 MG tablet Take 1 mg by mouth daily as needed for anxiety.  05/19/18  Yes [provider]  cyanocobalamin  (,VITAMIN B-12,) 1000 MCG/ML injection Inject 1,000 mcg into the muscle every 30 (thirty) days.  02/04/20  Yes [provider]  empagliflozin (JARDIANCE) 10 MG TABS tablet  Take 10 mg by mouth daily.  05/21/18  Yes [provider]  EPINEPHrine  (EPIPEN ) 0.3 mg/0.3 mL SOAJ injection Inject 0.3 mLs (0.3 mg total) into the muscle once. 03/24/13  Yes Baxley, Ronal PARAS, MD  escitalopram  (LEXAPRO ) 20 MG tablet Take 20 mg by mouth daily.   Yes [provider]  HYDROcodone-acetaminophen  (NORCO/VICODIN) 5-325 MG tablet Take 1 tablet by mouth every 6 (six) hours as needed. 01/10/24  Yes [provider]  methocarbamol  (ROBAXIN ) 500 MG tablet Take 500 mg by mouth 3 (three) times daily. 01/22/24  Yes [provider]  metoprolol  tartrate (LOPRESSOR ) 25 MG tablet Take 25 mg by mouth daily as needed (High blood pressure).   Yes [provider]  Multiple Vitamin (MULTIVITAMIN WITH MINERALS) TABS tablet Take 1 tablet by mouth daily. 03/05/14  Yes Mikhail, Maryann, DO  Semaglutide,0.25 or 0.5MG /DOS, (OZEMPIC, 0.25 OR 0.5 MG/DOSE,) 2 MG/1.5ML SOPN Inject 0.5 mg into the skin once a week.   Yes [provider]  traMADol  (ULTRAM ) 50 MG tablet Take 1 tablet (50 mg total) by mouth every 6 (six) hours as needed for moderate pain. 02/16/20  Yes Tammy Sor, PA-C  traZODone (DESYREL) 50 MG tablet Take 50 mg by mouth 4 (four) times a week. 05/10/18  Yes [provider]  Blood Glucose Monitoring Suppl (FIFTY50 GLUCOSE METER 2.0) w/Device KIT Use as instructed 05/26/15   [provider]  glucose blood (ACCU-CHEK ACTIVE STRIPS) test strip 1 strip by Misc.(Non-Drug; Combo Route) route daily. 05/10/18   [provider]  nicotine (NICODERM CQ - DOSED IN MG/24 HOURS) 21 mg/24hr patch Place 21 mg onto the skin daily.    [provider]  oxyCODONE  (OXY IR/ROXICODONE ) 5 MG immediate release tablet Take 5 mg by mouth every 6 (six) hours. 01/22/24   [provider]   No results found.  Positive ROS: All other systems have been reviewed and were otherwise negative with the exception of those mentioned in the HPI and as  above.  Physical Exam: General: Alert, no acute distress Cardiovascular: No pedal edema Respiratory: No cyanosis, no use of accessory musculature GI: No organomegaly, abdomen is soft and non-tender Skin: No lesions in the area of chief complaint Neurologic: Sensation intact distally Psychiatric: Patient is competent for consent with normal mood and affect Lymphatic: No axillary or cervical lymphadenopathy  MUSCULOSKELETAL:  Left upper extremity - skin intact, ecchymosis of the shoulder - TTP left shoulder - motor intact axillary, median, radial and ulnar nerve distributions - sensation intact to light touch median, radial, and ulnar nerve distributions - 2+ radial pulse  Assessment: Left displaced,  comminuted and angulated proximal humerus fracture  Plan: Informed consent obtained Proceed with ORIF left proximal humerus NWB LUE in sling NPO Pre op ancef TXA 1 g Return to clinic 2 weeks post op for skin check and repeat x-rays of left shoulder.    Rankin LELON Pizza, MD    01/24/2024 9:42 AM

## 2024-01-23 NOTE — Progress Notes (Addendum)
 For Anesthesia: PCP - Santana LITTIE Molt, NP last office visit note/surgical clearance 01/17/24 in [facility] Cardiologist - N/A  Bowel Prep reminder: N/A  Chest x-ray - greater than 1 year in [facility] EKG - 01/17/24 in care everywhere Stress Test - 04/29/13 in care everywhere ECHO - N/A Cardiac Cath -  N/A Pacemaker/ICD device last checked: N/A Pacemaker orders received: N/A Device Rep notified: N/A  Spinal Cord Stimulator: N/A  Sleep Study - 12/20/2018 in [facility] CPAP - does not use  Fasting Blood Sugar - husband unsure Checks Blood Sugar _continous  Date and result of last Hgb A1c- 01/17/24 in media  Last dose of GLP1 agonist- Ozempic GLP1 instructions: Hold 7 days prior to schedule (Hold 24 hours-daily) per husband patient has not taking in past week   Last dose of SGLT-2 inhibitors- Jardiance SGLT-2 instructions: Hold 72 hours prior to surgery last dose 01/22/24  Blood Thinner Instructions: N/A Last Dose:N/A Time last taken:N/A  Aspirin Instructions: N/A Last Dose: N/A Time last taken:N/A  Activity level: Can go up a flight of stairs and activities of daily living without stopping and without chest pain and/or shortness of breath   Anesthesia review: N/A  Patient denies shortness of breath, fever, cough and chest pain at PAT appointment   Patient verbalized understanding of instructions that were reviewed over the telephone.

## 2024-01-24 ENCOUNTER — Ambulatory Visit (HOSPITAL_COMMUNITY): Admitting: Certified Registered Nurse Anesthetist

## 2024-01-24 ENCOUNTER — Ambulatory Visit (HOSPITAL_COMMUNITY)

## 2024-01-24 ENCOUNTER — Ambulatory Visit (HOSPITAL_COMMUNITY): Admission: RE | Admit: 2024-01-24 | Discharge: 2024-01-24 | Disposition: A | Discharge status: Home / Self Care

## 2024-01-24 ENCOUNTER — Encounter: Admission: RE | Disposition: A | Payer: Self-pay

## 2024-01-24 ENCOUNTER — Encounter (HOSPITAL_COMMUNITY): Payer: Self-pay

## 2024-01-24 DIAGNOSIS — E1140 Type 2 diabetes mellitus with diabetic neuropathy, unspecified: Secondary | ICD-10-CM | POA: Insufficient documentation

## 2024-01-24 DIAGNOSIS — S42202A Unspecified fracture of upper end of left humerus, initial encounter for closed fracture: Secondary | ICD-10-CM | POA: Insufficient documentation

## 2024-01-24 DIAGNOSIS — Z9884 Bariatric surgery status: Secondary | ICD-10-CM | POA: Diagnosis not present

## 2024-01-24 DIAGNOSIS — I10 Essential (primary) hypertension: Secondary | ICD-10-CM | POA: Insufficient documentation

## 2024-01-24 DIAGNOSIS — G4730 Sleep apnea, unspecified: Secondary | ICD-10-CM | POA: Insufficient documentation

## 2024-01-24 DIAGNOSIS — Z7984 Long term (current) use of oral hypoglycemic drugs: Secondary | ICD-10-CM | POA: Diagnosis not present

## 2024-01-24 DIAGNOSIS — Z7985 Long-term (current) use of injectable non-insulin antidiabetic drugs: Secondary | ICD-10-CM | POA: Diagnosis not present

## 2024-01-24 DIAGNOSIS — E1139 Type 2 diabetes mellitus with other diabetic ophthalmic complication: Secondary | ICD-10-CM | POA: Insufficient documentation

## 2024-01-24 DIAGNOSIS — F32A Depression, unspecified: Secondary | ICD-10-CM | POA: Diagnosis not present

## 2024-01-24 DIAGNOSIS — W19XXXA Unspecified fall, initial encounter: Secondary | ICD-10-CM | POA: Insufficient documentation

## 2024-01-24 DIAGNOSIS — H42 Glaucoma in diseases classified elsewhere: Secondary | ICD-10-CM | POA: Diagnosis not present

## 2024-01-24 DIAGNOSIS — F17210 Nicotine dependence, cigarettes, uncomplicated: Secondary | ICD-10-CM | POA: Insufficient documentation

## 2024-01-24 DIAGNOSIS — H409 Unspecified glaucoma: Secondary | ICD-10-CM | POA: Insufficient documentation

## 2024-01-24 DIAGNOSIS — Z79899 Other long term (current) drug therapy: Secondary | ICD-10-CM | POA: Insufficient documentation

## 2024-01-24 DIAGNOSIS — E119 Type 2 diabetes mellitus without complications: Secondary | ICD-10-CM

## 2024-01-24 HISTORY — PX: ORIF HUMERUS FRACTURE: SHX2126

## 2024-01-24 HISTORY — DX: Personal history of diseases of the blood and blood-forming organs and certain disorders involving the immune mechanism: Z86.2

## 2024-01-24 LAB — GLUCOSE, CAPILLARY
Glucose-Capillary: 113 mg/dL — ABNORMAL HIGH (ref 70–99)
Glucose-Capillary: 112 mg/dL — ABNORMAL HIGH (ref 70–99)

## 2024-01-24 SURGERY — OPEN REDUCTION INTERNAL FIXATION (ORIF) PROXIMAL HUMERUS FRACTURE
Anesthesia: General | Site: Shoulder | Laterality: Left

## 2024-01-24 MED ORDER — CHLORHEXIDINE GLUCONATE 0.12 % MT SOLN
15.0000 mL | Freq: Once | OROMUCOSAL | Status: AC
  Administered 2024-01-24: 15 mL via OROMUCOSAL

## 2024-01-24 MED ORDER — 0.9 % SODIUM CHLORIDE (POUR BTL) OPTIME
TOPICAL | Status: DC | PRN
  Administered 2024-01-24: 1000 mL

## 2024-01-24 MED ORDER — LIDOCAINE HCL (PF) 2 % IJ SOLN
INTRAMUSCULAR | Status: DC | PRN
  Administered 2024-01-24: 50 mg via INTRADERMAL

## 2024-01-24 MED ORDER — TRANEXAMIC ACID-NACL 1000-0.7 MG/100ML-% IV SOLN
1000.0000 mg | INTRAVENOUS | Status: AC
  Administered 2024-01-24: 1000 mg via INTRAVENOUS
  Filled 2024-01-24: qty 100

## 2024-01-24 MED ORDER — ACETAMINOPHEN 10 MG/ML IV SOLN: 1000.0000 mg | Freq: Once | INTRAVENOUS | Status: DC | PRN

## 2024-01-24 MED ORDER — LACTATED RINGERS IV SOLN: INTRAVENOUS | Status: DC

## 2024-01-24 MED ORDER — PHENYLEPHRINE HCL-NACL 20-0.9 MG/250ML-% IV SOLN
INTRAVENOUS | Status: DC | PRN
  Administered 2024-01-24: 40 ug/min via INTRAVENOUS

## 2024-01-24 MED ORDER — ORAL CARE MOUTH RINSE: 15.0000 mL | Freq: Once | OROMUCOSAL | Status: AC

## 2024-01-24 MED ORDER — ROCURONIUM BROMIDE 10 MG/ML (PF) SYRINGE
PREFILLED_SYRINGE | INTRAVENOUS | Status: DC | PRN
  Administered 2024-01-24: 20 mg via INTRAVENOUS
  Administered 2024-01-24: 10 mg via INTRAVENOUS
  Administered 2024-01-24: 50 mg via INTRAVENOUS
  Administered 2024-01-24: 20 mg via INTRAVENOUS

## 2024-01-24 MED ORDER — MIDAZOLAM HCL (PF) 2 MG/2ML IJ SOLN
2.0000 mg | Freq: Once | INTRAMUSCULAR | Status: AC
  Administered 2024-01-24: 2 mg via INTRAVENOUS
  Filled 2024-01-24: qty 2

## 2024-01-24 MED ORDER — FENTANYL CITRATE (PF) 50 MCG/ML IJ SOSY
50.0000 ug | PREFILLED_SYRINGE | INTRAMUSCULAR | Status: DC
  Administered 2024-01-24: 100 ug via INTRAVENOUS
  Filled 2024-01-24: qty 2

## 2024-01-24 MED ORDER — ARTIFICIAL TEARS OPHTHALMIC OINT
TOPICAL_OINTMENT | OPHTHALMIC | Status: AC
  Filled 2024-01-24: qty 3.5

## 2024-01-24 MED ORDER — BUPIVACAINE HCL (PF) 0.5 % IJ SOLN
INTRAMUSCULAR | Status: DC | PRN
  Administered 2024-01-24: 12 mL via PERINEURAL

## 2024-01-24 MED ORDER — LIDOCAINE HCL (PF) 2 % IJ SOLN
INTRAMUSCULAR | Status: AC
  Filled 2024-01-24: qty 5

## 2024-01-24 MED ORDER — ALBUMIN HUMAN 5 % IV SOLN
INTRAVENOUS | Status: AC
  Filled 2024-01-24: qty 250

## 2024-01-24 MED ORDER — DROPERIDOL 2.5 MG/ML IJ SOLN: 0.6250 mg | Freq: Once | INTRAMUSCULAR | Status: DC | PRN

## 2024-01-24 MED ORDER — CEFAZOLIN SODIUM-DEXTROSE 2-4 GM/100ML-% IV SOLN
2.0000 g | INTRAVENOUS | Status: AC
  Administered 2024-01-24: 2 g via INTRAVENOUS
  Filled 2024-01-24: qty 100

## 2024-01-24 MED ORDER — VANCOMYCIN HCL 1000 MG IV SOLR
INTRAVENOUS | Status: AC
  Filled 2024-01-24: qty 40

## 2024-01-24 MED ORDER — DEXAMETHASONE SOD PHOSPHATE PF 10 MG/ML IJ SOLN
INTRAMUSCULAR | Status: DC | PRN
  Administered 2024-01-24: 5 mg via INTRAVENOUS

## 2024-01-24 MED ORDER — SENNOSIDES-DOCUSATE SODIUM 8.6-50 MG PO TABS: 1.0000 | ORAL_TABLET | Freq: Every day | ORAL | 0 refills | Status: AC

## 2024-01-24 MED ORDER — PROPOFOL 10 MG/ML IV BOLUS
INTRAVENOUS | Status: DC | PRN
  Administered 2024-01-24: 50 ug/kg/min via INTRAVENOUS
  Administered 2024-01-24: 120 mg via INTRAVENOUS

## 2024-01-24 MED ORDER — OXYCODONE HCL 5 MG PO TABS: 5.0000 mg | ORAL_TABLET | Freq: Once | ORAL | Status: DC | PRN

## 2024-01-24 MED ORDER — ROCURONIUM BROMIDE 10 MG/ML (PF) SYRINGE
PREFILLED_SYRINGE | INTRAVENOUS | Status: AC
  Filled 2024-01-24: qty 10

## 2024-01-24 MED ORDER — METHOCARBAMOL 500 MG PO TABS: 500.0000 mg | ORAL_TABLET | Freq: Four times a day (QID) | ORAL | 0 refills | Status: AC | PRN

## 2024-01-24 MED ORDER — OXYCODONE HCL 5 MG/5ML PO SOLN: 5.0000 mg | Freq: Once | ORAL | Status: DC | PRN

## 2024-01-24 MED ORDER — SUGAMMADEX SODIUM 200 MG/2ML IV SOLN
INTRAVENOUS | Status: DC | PRN
  Administered 2024-01-24: 200 mg via INTRAVENOUS

## 2024-01-24 MED ORDER — ONDANSETRON HCL 4 MG/2ML IJ SOLN
INTRAMUSCULAR | Status: DC | PRN
  Administered 2024-01-24: 4 mg via INTRAVENOUS

## 2024-01-24 MED ORDER — BUPIVACAINE LIPOSOME 1.3 % IJ SUSP
INTRAMUSCULAR | Status: DC | PRN
  Administered 2024-01-24: 10 mL via PERINEURAL

## 2024-01-24 MED ORDER — VANCOMYCIN HCL 1 G IV SOLR
INTRAVENOUS | Status: DC | PRN
  Administered 2024-01-24: 2 g via TOPICAL

## 2024-01-24 MED ORDER — MEPERIDINE HCL 100 MG/ML IJ SOLN: 6.2500 mg | INTRAMUSCULAR | Status: DC | PRN

## 2024-01-24 MED ORDER — FENTANYL CITRATE (PF) 100 MCG/2ML IJ SOLN
INTRAMUSCULAR | Status: AC
  Filled 2024-01-24: qty 2

## 2024-01-24 MED ORDER — INSULIN ASPART 100 UNIT/ML IJ SOLN: 0.0000 [IU] | INTRAMUSCULAR | Status: DC | PRN

## 2024-01-24 MED ORDER — HYDROMORPHONE HCL 1 MG/ML IJ SOLN: 0.2500 mg | INTRAMUSCULAR | Status: DC | PRN

## 2024-01-24 MED ORDER — ONDANSETRON HCL 4 MG/2ML IJ SOLN
INTRAMUSCULAR | Status: AC
  Filled 2024-01-24: qty 2

## 2024-01-24 MED ORDER — PROPOFOL 10 MG/ML IV BOLUS
INTRAVENOUS | Status: AC
  Filled 2024-01-24: qty 20

## 2024-01-24 MED ORDER — FENTANYL CITRATE (PF) 100 MCG/2ML IJ SOLN
INTRAMUSCULAR | Status: DC | PRN
  Administered 2024-01-24 (×3): 25 ug via INTRAVENOUS

## 2024-01-24 SURGICAL SUPPLY — 61 items
BAG COUNTER SPONGE SURGICOUNT (BAG) IMPLANT
BAG ZIPLOCK 12X15 (MISCELLANEOUS) IMPLANT
BIT DRILL CANN QC 2.8X165 (BIT) IMPLANT
BIT DRILL X LONG 2.5 (BIT) IMPLANT
BOOTIES KNEE HIGH SLOAN (MISCELLANEOUS) IMPLANT
CHLORAPREP W/TINT 26 (MISCELLANEOUS) ×2 IMPLANT
COVER SURGICAL LIGHT HANDLE (MISCELLANEOUS) IMPLANT
DRAPE C-ARM 42X120 X-RAY (DRAPES) ×1 IMPLANT
DRAPE IMP U-DRAPE 54X76 (DRAPES) ×2 IMPLANT
DRAPE INCISE IOBAN 66X45 STRL (DRAPES) IMPLANT
DRAPE POUCH INSTRU U-SHP 10X18 (DRAPES) ×1 IMPLANT
DRAPE SHEET LG 3/4 BI-LAMINATE (DRAPES) ×1 IMPLANT
DRAPE SURG 17X23 STRL (DRAPES) IMPLANT
DRAPE SURG ORHT 6 SPLT 77X108 (DRAPES) ×2 IMPLANT
DRAPE TOP 10253 STERILE (DRAPES) ×1 IMPLANT
DRAPE U-SHAPE 47X51 STRL (DRAPES) IMPLANT
DRSG MEPILEX POST OP 4X8 (GAUZE/BANDAGES/DRESSINGS) ×2 IMPLANT
ELECT COATED BLADE 2.86 ST (ELECTRODE) ×1 IMPLANT
ELECT PENCIL ROCKER SW 15FT (MISCELLANEOUS) ×1 IMPLANT
ELECT REM PT RETURN 15FT ADLT (MISCELLANEOUS) ×1 IMPLANT
GAUZE SPONGE 4X4 12PLY STRL (GAUZE/BANDAGES/DRESSINGS) IMPLANT
GLOVE BIO SURGEON STRL SZ8 (GLOVE) IMPLANT
GLOVE BIOGEL PI IND STRL 8 (GLOVE) ×1 IMPLANT
GLOVE SS BIOGEL STRL SZ 8 (GLOVE) ×2 IMPLANT
GOWN STRL REUS W/ TWL XL LVL3 (GOWN DISPOSABLE) ×1 IMPLANT
GOWN STRL SURGICAL XL XLNG (GOWN DISPOSABLE) IMPLANT
KIT BASIN OR (CUSTOM PROCEDURE TRAY) ×1 IMPLANT
KIT STABILIZATION SHOULDER (MISCELLANEOUS) ×1 IMPLANT
KIT TURNOVER KIT A (KITS) ×1 IMPLANT
KWIRE 1.6X150 (WIRE) IMPLANT
MANIFOLD NEPTUNE II (INSTRUMENTS) IMPLANT
PACK SHOULDER (CUSTOM PROCEDURE TRAY) ×1 IMPLANT
PLATE LCP 3.5 PROX HUM 3HX90 (Plate) IMPLANT
PROTECTOR NERVE ULNAR (MISCELLANEOUS) IMPLANT
RESTRAINT HEAD UNIVERSAL NS (MISCELLANEOUS) IMPLANT
SCREW LOCK CORT ST 3.5X30 (Screw) IMPLANT
SCREW LOCK CORT ST 3.5X32 (Screw) IMPLANT
SCREW LOCK T15 FT 30X3.5X2.9X (Screw) IMPLANT
SCREW LOCK T15 FT 38X3.5XST (Screw) IMPLANT
SCREW LOCK T15 FT 40X3.5XST (Screw) IMPLANT
SCREW LOCKING 3.5X45 (Screw) IMPLANT
SCREW LOCKING 3.5X50 (Screw) IMPLANT
SLING ARM FOAM STRAP LRG (SOFTGOODS) ×1 IMPLANT
SLING ARM FOAM STRAP MED (SOFTGOODS) IMPLANT
SLING ARM FOAM STRAP XLG (SOFTGOODS) IMPLANT
SLING ARM IMMOBILIZER LRG (SOFTGOODS) IMPLANT
SLING ARM IMMOBILIZER MED (SOFTGOODS) IMPLANT
SPIKE FLUID TRANSFER (MISCELLANEOUS) IMPLANT
STAPLER SKIN PROX 35W (STAPLE) IMPLANT
STRIP CLOSURE SKIN 1/2X4 (GAUZE/BANDAGES/DRESSINGS) ×1 IMPLANT
SUCTION TUBE FRAZIER 12FR DISP (SUCTIONS) IMPLANT
SUT ETHIBOND 2 V 37 (SUTURE) IMPLANT
SUT MNCRL AB 4-0 PS2 18 (SUTURE) ×1 IMPLANT
SUT VIC AB 0 CT1 27XBRD ANBCTR (SUTURE) ×1 IMPLANT
SUT VIC AB 0 CT2 27 (SUTURE) IMPLANT
SUT VIC AB 2-0 CT2 27 (SUTURE) IMPLANT
SUT VIC AB 2-0 PS2 27 (SUTURE) ×1 IMPLANT
SUTURE FIBERWR #2 38 T-5 BLUE (SUTURE) ×2 IMPLANT
TOWEL GREEN STERILE FF (TOWEL DISPOSABLE) IMPLANT
TOWEL OR 17X26 10 PK STRL BLUE (TOWEL DISPOSABLE) IMPLANT
TUBE SUCTION HIGH CAP CLEAR NV (SUCTIONS) ×1 IMPLANT

## 2024-01-24 NOTE — Anesthesia Preprocedure Evaluation (Addendum)
 Anesthesia Evaluation  Patient identified by MRN, date of birth, ID band Patient awake    Reviewed: Allergy & Precautions, NPO status , Patient's Chart, lab work & pertinent test results  Airway Mallampati: III  TM Distance: <3 FB Neck ROM: Full    Dental  (+) Teeth Intact, Caps   Pulmonary asthma , sleep apnea , Current Smoker and Patient abstained from smoking.    + decreased breath sounds      Cardiovascular hypertension, Pt. on home beta blockers  Rhythm:Regular Rate:Normal     Neuro/Psych  PSYCHIATRIC DISORDERS  Depression     Neuromuscular disease    GI/Hepatic negative GI ROS, Neg liver ROS,,,  Endo/Other  diabetes, Type 2, Oral Hypoglycemic Agents    Renal/GU negative Renal ROS     Musculoskeletal negative musculoskeletal ROS (+)    Abdominal   Peds  Hematology  (+) Blood dyscrasia, anemia   Anesthesia Other Findings   Reproductive/Obstetrics                              Anesthesia Physical Anesthesia Plan  ASA: 2  Anesthesia Plan: General   Post-op Pain Management: Regional block*   Induction: Intravenous  PONV Risk Score and Plan: 3 and Ondansetron , Dexamethasone , Midazolam  and Propofol  infusion  Airway Management Planned: Oral ETT and Video Laryngoscope Planned  Additional Equipment: None  Intra-op Plan:   Post-operative Plan: Extubation in OR  Informed Consent: I have reviewed the patients History and Physical, chart, labs and discussed the procedure including the risks, benefits and alternatives for the proposed anesthesia with the patient or authorized representative who has indicated his/her understanding and acceptance.     Dental advisory given  Plan Discussed with: CRNA  Anesthesia Plan Comments: (Ok to proceed with PNB - motor intact axillary, median, radial and ulnar nerve distributions & sensation intact to light touch median, radial, and ulnar nerve  distributions per surgeon.    Background propofol  and BIS)         Anesthesia Quick Evaluation

## 2024-01-24 NOTE — Discharge Instructions (Addendum)
 Orthopedic surgery discharge instructions:  Bandages: -Maintain postoperative bandage until follow-up appointment.  -You may remove the ace wrap prior to showering post op day 3. - Do not use any lotions or creams on or around the incision until instructed by your surgeon.  Showering -  This is waterproof, and you may begin showering on postoperative day #3. You should keep the bandage in place wrap it in plastic wrap at that point prior to showering  - Do not submerge the surgical site underwater.    Activity Restrictions -No lifting over 2 pounds with operative arm.   - Maintain sling until follow up. You may come out of the sling for activities of daily living such as bathing, washing your face and brushing your teeth, eating, and getting dressed.  Otherwise maintain your sling when you are out of the house and sleeping.  Medications - For mild to moderate pain use Tylenol  as needed around-the-clock, 1000 mg every 8 h for pain. Do not take more than this higher levels can affect kidney function - For breakthrough pain use oxycodone  as necessary. - Please take a stool softener such as docusate and senna while taking a narcotic pain medication - Aspirin 81 mg twice daily x 4 weeks to avoid blood clots  Follow Up:  -You will return to see Dr. Teresa in the office in 2 weeks for routine postoperative check with x-rays. PT will begin after this visit.

## 2024-01-24 NOTE — Anesthesia Procedure Notes (Signed)
 Procedure Name: Intubation Date/Time: 01/24/2024 10:25 AM  Performed by: Franchot Delon RAMAN, CRNAPre-anesthesia Checklist: Patient identified, Emergency Drugs available, Suction available and Patient being monitored Patient Re-evaluated:Patient Re-evaluated prior to induction Oxygen Delivery Method: Circle System Utilized Preoxygenation: Pre-oxygenation with 100% oxygen Induction Type: IV induction Ventilation: Mask ventilation without difficulty Laryngoscope Size: Glidescope and 3 Grade View: Grade I Tube type: Parker flex tip Tube size: 7.0 mm Number of attempts: 1 Airway Equipment and Method: Rigid stylet and Video-laryngoscopy Placement Confirmation: ETT inserted through vocal cords under direct vision, positive ETCO2 and breath sounds checked- equal and bilateral Secured at: 22 cm Tube secured with: Tape Dental Injury: Teeth and Oropharynx as per pre-operative assessment  Comments: Elective GlydeScope used for patient's small mouth and anterior airway. Gr 1 view, + etCo2, atraumatic intubation.

## 2024-01-24 NOTE — Transfer of Care (Signed)
 Immediate Anesthesia Transfer of Care Note  Patient: Amber Mata  Procedure(s) Performed: OPEN REDUCTION INTERNAL FIXATION (ORIF) PROXIMAL HUMERUS FRACTURE (Left: Shoulder)  Patient Location: PACU  Anesthesia Type:General and Regional  Level of Consciousness: awake, alert , oriented, and patient cooperative  Airway & Oxygen Therapy: Patient Spontanous Breathing and Patient connected to face mask oxygen  Post-op Assessment: Report given to RN and Post -op Vital signs reviewed and stable  Post vital signs: Reviewed and stable  Last Vitals:  Vitals Value Taken Time  BP 166/85 01/24/24 13:54  Temp    Pulse 86 01/24/24 13:58  Resp 20 01/24/24 13:58  SpO2 96 % 01/24/24 13:58  Vitals shown include unfiled device data.  Last Pain:  Vitals:   01/24/24 0757  TempSrc:   PainSc: 7       Patients Stated Pain Goal: 4 (01/24/24 0757)  Complications: No notable events documented.

## 2024-01-24 NOTE — Anesthesia Procedure Notes (Signed)
 Anesthesia Regional Block: Interscalene brachial plexus block   Pre-Anesthetic Checklist: , timeout performed,  Correct Patient, Correct Site, Correct Laterality,  Correct Procedure, Correct Position, site marked,  Risks and benefits discussed,  Surgical consent,  Pre-op evaluation,  At surgeon's request and post-op pain management  Laterality: Left  Prep: chloraprep       Needles:  Injection technique: Single-shot  Needle Type: Echogenic Stimulator Needle     Needle Length: 9cm  Needle Gauge: 21     Additional Needles:   Procedures:,,,, ultrasound used (permanent image in chart),,    Narrative:  Start time: 01/24/2024 9:40 AM End time: 01/24/2024 9:46 AM Injection made incrementally with aspirations every 5 mL.  Performed by: Personally  Anesthesiologist: Tilford Franky BIRCH, MD  Additional Notes: Discussed risks and benefits of the nerve block in detail, including but not limited vascular injury, permanent nerve damage and infection.   Patient tolerated the procedure well. Local anesthetic introduced in an incremental fashion under minimal resistance after negative aspirations. No paresthesias were elicited. After completion of the procedure, no acute issues were identified and patient continued to be monitored by RN.

## 2024-01-24 NOTE — Anesthesia Postprocedure Evaluation (Signed)
 Anesthesia Post Note  Patient: Amber Mata  Procedure(s) Performed: OPEN REDUCTION INTERNAL FIXATION (ORIF) PROXIMAL HUMERUS FRACTURE (Left: Shoulder)     Patient location during evaluation: PACU Anesthesia Type: General Level of consciousness: awake and alert Pain management: pain level controlled Vital Signs Assessment: post-procedure vital signs reviewed and stable Respiratory status: spontaneous breathing, nonlabored ventilation, respiratory function stable and patient connected to nasal cannula oxygen Cardiovascular status: blood pressure returned to baseline and stable Postop Assessment: no apparent nausea or vomiting Anesthetic complications: no   No notable events documented.  Last Vitals:  Vitals:   01/24/24 1430 01/24/24 1445  BP: 135/78 (!) 142/83  Pulse: 84 85  Resp: 15 14  Temp:    SpO2: 91% 94%    Last Pain:  Vitals:   01/24/24 1445  TempSrc:   PainSc: 0-No pain                 [name]

## 2024-01-25 ENCOUNTER — Encounter (HOSPITAL_COMMUNITY): Payer: Self-pay
